# Patient Record
Sex: Female | Born: 1945 | Race: White | Hispanic: No | Smoking: Current every day smoker
Health system: Southern US, Community
[De-identification: ages and names within clinical notes are randomized; demographics above are authoritative.]

## PROBLEM LIST (undated history)

## (undated) DIAGNOSIS — M1990 Unspecified osteoarthritis, unspecified site: Secondary | ICD-10-CM

## (undated) DIAGNOSIS — G4700 Insomnia, unspecified: Secondary | ICD-10-CM

## (undated) DIAGNOSIS — C4491 Basal cell carcinoma of skin, unspecified: Secondary | ICD-10-CM

## (undated) DIAGNOSIS — F329 Major depressive disorder, single episode, unspecified: Secondary | ICD-10-CM

## (undated) DIAGNOSIS — I2510 Atherosclerotic heart disease of native coronary artery without angina pectoris: Secondary | ICD-10-CM

## (undated) DIAGNOSIS — F419 Anxiety disorder, unspecified: Secondary | ICD-10-CM

## (undated) DIAGNOSIS — K759 Inflammatory liver disease, unspecified: Secondary | ICD-10-CM

## (undated) DIAGNOSIS — I10 Essential (primary) hypertension: Secondary | ICD-10-CM

## (undated) DIAGNOSIS — Z9849 Cataract extraction status, unspecified eye: Secondary | ICD-10-CM

## (undated) DIAGNOSIS — R519 Headache, unspecified: Secondary | ICD-10-CM

## (undated) DIAGNOSIS — F32A Depression, unspecified: Secondary | ICD-10-CM

## (undated) DIAGNOSIS — E7800 Pure hypercholesterolemia, unspecified: Secondary | ICD-10-CM

## (undated) DIAGNOSIS — H6690 Otitis media, unspecified, unspecified ear: Secondary | ICD-10-CM

## (undated) DIAGNOSIS — R51 Headache: Secondary | ICD-10-CM

## (undated) HISTORY — PX: KNEE SURGERY: SHX244

## (undated) HISTORY — DX: Pure hypercholesterolemia, unspecified: E78.00

## (undated) HISTORY — DX: Otitis media, unspecified, unspecified ear: H66.90

## (undated) HISTORY — PX: EYE SURGERY: SHX253

## (undated) HISTORY — PX: TONSILLECTOMY: SUR1361

## (undated) HISTORY — DX: Unspecified osteoarthritis, unspecified site: M19.90

## (undated) HISTORY — DX: Essential (primary) hypertension: I10

## (undated) HISTORY — DX: Cataract extraction status, unspecified eye: Z98.49

## (undated) HISTORY — DX: Inflammatory liver disease, unspecified: K75.9

## (undated) HISTORY — DX: Basal cell carcinoma of skin, unspecified: C44.91

## (undated) HISTORY — PX: SALPINGOOPHORECTOMY: SHX82

## (undated) HISTORY — PX: CARDIAC CATHETERIZATION: SHX172

---

## 1970-04-08 DIAGNOSIS — K759 Inflammatory liver disease, unspecified: Secondary | ICD-10-CM

## 1970-04-08 HISTORY — DX: Inflammatory liver disease, unspecified: K75.9

## 2006-03-25 ENCOUNTER — Emergency Department (HOSPITAL_COMMUNITY): Admission: EM | Admit: 2006-03-25 | Discharge: 2006-03-25 | Payer: Self-pay | Admitting: Emergency Medicine

## 2006-04-04 ENCOUNTER — Emergency Department (HOSPITAL_COMMUNITY): Admission: EM | Admit: 2006-04-04 | Discharge: 2006-04-04 | Payer: Self-pay | Admitting: Emergency Medicine

## 2007-11-13 ENCOUNTER — Emergency Department (HOSPITAL_COMMUNITY): Admission: EM | Admit: 2007-11-13 | Discharge: 2007-11-14 | Payer: Self-pay | Admitting: Emergency Medicine

## 2009-08-07 ENCOUNTER — Ambulatory Visit (HOSPITAL_COMMUNITY): Admission: RE | Admit: 2009-08-07 | Discharge: 2009-08-07 | Payer: Self-pay | Admitting: Nurse Practitioner

## 2009-11-07 ENCOUNTER — Ambulatory Visit: Payer: Self-pay | Admitting: Internal Medicine

## 2009-11-07 DIAGNOSIS — K625 Hemorrhage of anus and rectum: Secondary | ICD-10-CM | POA: Insufficient documentation

## 2009-11-07 DIAGNOSIS — R197 Diarrhea, unspecified: Secondary | ICD-10-CM | POA: Insufficient documentation

## 2009-11-07 DIAGNOSIS — K5190 Ulcerative colitis, unspecified, without complications: Secondary | ICD-10-CM | POA: Insufficient documentation

## 2009-11-08 ENCOUNTER — Encounter: Payer: Self-pay | Admitting: Internal Medicine

## 2009-12-15 ENCOUNTER — Telehealth (INDEPENDENT_AMBULATORY_CARE_PROVIDER_SITE_OTHER): Payer: Self-pay

## 2009-12-28 ENCOUNTER — Ambulatory Visit (HOSPITAL_COMMUNITY): Admission: RE | Admit: 2009-12-28 | Discharge: 2009-12-28 | Payer: Self-pay | Admitting: Internal Medicine

## 2009-12-28 ENCOUNTER — Ambulatory Visit: Payer: Self-pay | Admitting: Internal Medicine

## 2009-12-28 HISTORY — PX: COLONOSCOPY: SHX174

## 2010-01-01 ENCOUNTER — Telehealth (INDEPENDENT_AMBULATORY_CARE_PROVIDER_SITE_OTHER): Payer: Self-pay

## 2010-01-03 ENCOUNTER — Encounter: Payer: Self-pay | Admitting: Gastroenterology

## 2010-01-09 ENCOUNTER — Ambulatory Visit (HOSPITAL_COMMUNITY): Admission: RE | Admit: 2010-01-09 | Discharge: 2010-01-09 | Payer: Self-pay | Admitting: Nurse Practitioner

## 2010-01-12 ENCOUNTER — Encounter: Payer: Self-pay | Admitting: Internal Medicine

## 2010-01-30 ENCOUNTER — Ambulatory Visit (HOSPITAL_COMMUNITY): Admission: RE | Admit: 2010-01-30 | Discharge: 2010-01-30 | Payer: Self-pay | Admitting: Orthopaedic Surgery

## 2010-02-28 ENCOUNTER — Encounter (INDEPENDENT_AMBULATORY_CARE_PROVIDER_SITE_OTHER): Payer: Self-pay

## 2010-03-16 ENCOUNTER — Encounter: Payer: Self-pay | Admitting: Gastroenterology

## 2010-03-19 ENCOUNTER — Encounter: Payer: Self-pay | Admitting: Internal Medicine

## 2010-03-19 LAB — CONVERTED CEMR LAB
BUN: 9 mg/dL (ref 6–23)
Potassium: 4.3 meq/L (ref 3.5–5.3)
Sodium: 139 meq/L (ref 135–145)

## 2010-03-21 ENCOUNTER — Ambulatory Visit: Payer: Self-pay | Admitting: Internal Medicine

## 2010-04-17 ENCOUNTER — Encounter (INDEPENDENT_AMBULATORY_CARE_PROVIDER_SITE_OTHER): Payer: Self-pay | Admitting: *Deleted

## 2010-05-08 NOTE — Letter (Signed)
Summary: TCS order  TCS order   Imported By: Craige Cotta 11/07/2009 16:03:22  _____________________________________________________________________  External Attachment:    Type:   Image     Comment:   External Document

## 2010-05-08 NOTE — Letter (Signed)
Summary: REFERRAL FROM Erma Pinto, FNP   REFERRAL FROM Erma Pinto, FNP   Imported By: Hoy Morn 11/08/2009 12:01:52  _____________________________________________________________________  External Attachment:    Type:   Image     Comment:   External Document

## 2010-05-08 NOTE — Letter (Signed)
Summary: Patient Notice, Colon Biopsy Results  [facility]  [address]   [place], [place] [address]   Phone: [phone]  Fax: [phone]       January 12, 2010   Bonnie Powers [address] [place], [place]  [address] [birth year]    Dear [name],  I am pleased to inform you that the biopsies taken during your recent colonoscopy did not show any evidence of cancer upon pathologic examination.  However, they did show active ulcerative colitis.  Additional information/recommendations:  Continue with the treatment plan as outlined on the day of your exam.  Please call us if you are having persistent problems or have questions about your condition that have not been fully answered at this time.  Sincerely,    R. Garfield Cornea MD, [facility] Ph: [phone]    Fax: [phone]   Appended Document: Patient Notice, Colon Biopsy Results letter mailed to pt

## 2010-05-08 NOTE — Miscellaneous (Signed)
Summary: Orders Update  Clinical Lists Changes  Orders: Added new Test order of T-Basic Metabolic Panel ([id number]) - Signed  

## 2010-05-08 NOTE — Letter (Signed)
Summary: Recall, Labs Needed  [facility]  [address]   [place], [place] [address]   Phone: [phone]  Fax: [phone]    February 28, 2010  ALEXANDERA KUNTZMAN [address] [place], [place]  [address] 05/19/1945   Dear Ms. Golob,   Our records indicate it is time to repeat your blood work.  You can take the enclosed form to the lab on or near the date indicated.  Please make note of the new location of the lab:   [place], 2nd floor   [place] office will call you within a week to ten business days with the results.  If you do not hear from [place] in 10 business days, you should call the office.  If you have any questions regarding this, call the office at [phone], and ask for the nurse.  Labs are due on 04/04/2010.   Sincerely,    Burnadette Peter LPN  [facility] Ph: [phone]   Fax: [phone]

## 2010-05-08 NOTE — Progress Notes (Signed)
Summary: phone note/ pt rescheduled TCS due to car trouble  Phone Note Call from Patient   Caller: Patient Summary of Call: Pt called to reschedule TCS that was scheduled for 12/20/2009, due to car trouble. She rescheduled for 12/28/2009 at 12:45 and Maudie Mercury is aware. Moved in [place] also. Initial call taken by: Waldon Merl LPN,  December 15, 4096 2:20 PM

## 2010-05-08 NOTE — Progress Notes (Signed)
Summary: ulcerative colitis/ss   _____________________________________________________________________  External Attachment:    Type:   Image     Comment:   External Document Visit Type:  Initial Consult Referring Provider:  Erma Pinto Primary Care Provider:  Erma Pinto  Chief Complaint:  ulcerative colitis.  History of Present Illness: 65 year old lady referred by [facility] for further evaluation and management of ulcerative colitis. Patient relocated from [place] a few years back. She was diagnosed with ulcerative colitis  in 2006. She describes being on  Asacol and prednisone at different times. Recently given a short course of prednisone in [place];  Has been taking nonsteroidals on occasion for osteoarthritis as recently as a few weeks ago; does have a 7-8 bowel movements daily occasionally -  has some blood per rectum some tenesmus;   no fever.  Vague intermittent lower abdominal cramps. Has occasional reflux symptoms but no odynophagia or dysphagia.   No family history of inflammatory bowel disease or colorectal neoplasia / polyps.   Current Medications (verified): 1)  Vitamin D .... Once Daily 2)  Tylenol Extra Strength 500 Mg Tabs (Acetaminophen) .... As Needed 3)  Daily Multi  Tabs (Multiple Vitamins-Minerals) .... Take 1 Tablet By Mouth Once A Day 4)  Otc Allergy .... Once Daily  Allergies (verified): No Known Drug Allergies  Past History:  Family History: Last updated: 2009/11/24 Father: Deceased age 25  Skin cancer Mother: Deceased age 7  heart failure Siblings: one living brother  healthy three deceased brothers/ motorcycle accident/ complications of HIV/ Lung cancer  Social History: Last updated: 11-24-09 Marital Status: Married Children: 3  living     healthy      one girl deceased/miningitis Occupation: Retired     Engineer, [employer]  Past Medical History: Hypertension Arthritis Hypercholesteremia Hepatis ? in  1972 chronic ear infections ulcerative colitis  Past Surgical History: Fallopian tubes removed Right ovary removed Tonsilectomy Skin Cancer removed from left temple Growths removed from both eyes  Family History: Father: Deceased age 11  Skin cancer Mother: Deceased age 31  heart failure Siblings: one living brother  healthy three deceased brothers/ motorcycle accident/ complications of HIV/ Lung cancer  Social History: Marital Status: Married Children: 3  living     healthy      one girl deceased/miningitis Occupation: Retired     Engineer, petroleum room for [employer]  Vital Signs:  Patient profile:   65 year old female Height:      66 inches Weight:      226 pounds BMI:     36.61 Temp:     97.7 degrees F oral Pulse rate:   80 / minute BP sitting:   140 / 90  (left arm) Cuff size:   regular  Vitals Entered By: Waldon Merl LPN (November 08, 971 3:03 PM)  Physical Exam  General:  pleasant 65 year old lady resting Eyes:  no scleral icterus. Protime are pink Abdomen:  nondistended positive bowel sounds soft and nontender without appreciable mass or organomegaly until time of colonoscopy Impression & Recommendations: Impression: 65 year old lady with a credible, history of ulcerative colitis not on any significant treatment over the past several years. She does have intermittent bleeding diarrhea and abdominal cramps. She certainly does not appear acutely ill or toxic at this time. I suspect recent nonsteroidal therapy has exacerbated her condition. Recommendations: Limit avoid/ nonsteroidals as much as possible.   Diagnostic colonoscopy in the very near future. Risks, benefits, limitations imponderables and alternatives have been reviewed.  Her questions were answered; she is agreeable. She asked for a refill of the Lomotil which she has taken previously. I declined.  I did give her prescription for Bentyl 10 mg a.c. and h.s. #40 with one refill.  Thanks to Erma Pinto  FNP for her kind referral.  Appended Document: Orders Update    Clinical Lists Changes  Problems: Added new problem of ULCERATIVE COLITIS--UNIVERSAL ULCERATIVE COLITIS (ICD-556.9) Added new problem of RECTAL BLEEDING (ICD-569.3) Added new problem of DIARRHEA (ICD-787.91) Orders: Added new Service order of New Patient Level IV (83254) - Signed

## 2010-05-08 NOTE — Progress Notes (Signed)
Summary: insurance wont cover Wynot  Phone Note Call from Patient Call back at [facility] Phone [phone]   Caller: Patient Summary of Call: pt called- RMR put her on Lialda and her insurance wont cover it and it cost 732.00. The only other meds that are in her formulary- but are teir 2 are Balsalazide and Asacol (not asacol HD). There are no teir 1 covered drugs. pt needs something else called in to [facility] Initial call taken by: Burnadette Peter LPN,  January 01, 6001 9:56 AM     Appended Document: insurance wont cover lialda Has she tried anything else that has worked for her?   Appended Document: insurance wont cover lialda pt has only tried asacol and prednisone  Appended Document: insurance wont cover lialda    Prescriptions: ASACOL 400 MG TBEC (MESALAMINE) two by mouth tid  #180 x 11   Entered and Authorized by:   Laureen Ochs. Bernarda Caffey   Signed by:   Laureen Ochs Lewis PA-C on 01/03/2010   Method used:   Electronically to        [facility] (retail)       [place]  [address]       Ph: [phone]       Fax: [phone]   RxID:   [id number]     Appended Document: insurance wont cover lialda she will need met7 in three months  Appended Document: insurance wont cover lialda pt aware, lab order on file

## 2010-05-10 NOTE — Miscellaneous (Signed)
Summary: Orders Update  Clinical Lists Changes  Orders: Added new Test order of T-Basic Metabolic Panel ([id number]) - Signed  

## 2010-05-10 NOTE — Letter (Signed)
Summary: Recall Office Visit  [facility]  [address]   [place], [place] [address]   Phone: [phone]  Fax: [phone]      April 17, 2010   Bonnie Powers [address] [place], [place]  [address] November 11, 1945   Dear Ms. Hoh,   According to our records, it is time for you to schedule a follow-up office visit with [place].   At your convenience, please call [phone] to schedule an office visit. If you have any questions, concerns, or feel that this letter is in error, we would appreciate your call.   Sincerely,    [facility] Ph: [phone]   Fax: [phone]

## 2010-06-20 LAB — URINALYSIS, ROUTINE W REFLEX MICROSCOPIC
Glucose, UA: NEGATIVE mg/dL
Protein, ur: NEGATIVE mg/dL
Specific Gravity, Urine: 1.015 (ref 1.005–1.030)
pH: 5.5 (ref 5.0–8.0)

## 2010-06-20 LAB — SURGICAL PCR SCREEN: Staphylococcus aureus: NEGATIVE

## 2010-06-21 LAB — BASIC METABOLIC PANEL
Calcium: 9.5 mg/dL (ref 8.4–10.5)
GFR calc non Af Amer: >60 mL/min (ref >60)
Glucose, Bld: 94 mg/dL (ref 70–99)
Sodium: 139 mEq/L (ref 135–145)

## 2010-06-21 LAB — CBC
Hemoglobin: 12.8 g/dL (ref 12.0–15.0)
MCHC: 33 g/dL (ref 30.0–36.0)
RDW: 17.4 % — ABNORMAL HIGH (ref 11.5–15.5)

## 2010-08-01 ENCOUNTER — Other Ambulatory Visit (HOSPITAL_COMMUNITY): Payer: Self-pay | Admitting: Family Medicine

## 2010-08-01 DIAGNOSIS — Z139 Encounter for screening, unspecified: Secondary | ICD-10-CM

## 2010-08-13 ENCOUNTER — Ambulatory Visit (HOSPITAL_COMMUNITY)
Admission: RE | Admit: 2010-08-13 | Discharge: 2010-08-13 | Disposition: A | Discharge status: Home / Self Care | Payer: Medicare Other | Source: Ambulatory Visit | Attending: Family Medicine | Admitting: Family Medicine

## 2010-08-13 DIAGNOSIS — Z139 Encounter for screening, unspecified: Secondary | ICD-10-CM

## 2010-08-13 DIAGNOSIS — Z1231 Encounter for screening mammogram for malignant neoplasm of breast: Secondary | ICD-10-CM | POA: Insufficient documentation

## 2010-08-17 ENCOUNTER — Ambulatory Visit (INDEPENDENT_AMBULATORY_CARE_PROVIDER_SITE_OTHER): Payer: Medicare Other | Admitting: Urgent Care

## 2010-08-17 ENCOUNTER — Ambulatory Visit: Payer: Self-pay | Admitting: Internal Medicine

## 2010-08-17 ENCOUNTER — Encounter: Payer: Self-pay | Admitting: Urgent Care

## 2010-08-17 VITALS — BP 160/84 | HR 100 | Temp 97.3°F | Ht 66.0 in | Wt 222.2 lb

## 2010-08-17 DIAGNOSIS — R197 Diarrhea, unspecified: Secondary | ICD-10-CM

## 2010-08-17 DIAGNOSIS — K5190 Ulcerative colitis, unspecified, without complications: Secondary | ICD-10-CM

## 2010-08-17 LAB — CBC WITH DIFFERENTIAL/PLATELET
Lymphocytes Relative: 20 % (ref 12–46)
Lymphs Abs: 2 10*3/uL (ref 0.7–4.0)
MCV: 81.5 fL (ref 78.0–100.0)
Neutrophils Relative %: 71 % (ref 43–77)
Platelets: 362 10*3/uL (ref 150–400)
RBC: 5.04 MIL/uL (ref 3.87–5.11)
WBC: 10.1 10*3/uL (ref 4.0–10.5)

## 2010-08-17 LAB — COMPREHENSIVE METABOLIC PANEL
ALT: 11 U/L (ref 0–35)
CO2: 21 mEq/L (ref 19–32)
Calcium: 9.3 mg/dL (ref 8.4–10.5)
Chloride: 104 mEq/L (ref 96–112)
Sodium: 139 mEq/L (ref 135–145)
Total Protein: 7.2 g/dL (ref 6.0–8.3)

## 2010-08-17 MED ORDER — PREDNISONE 10 MG PO TABS: 10.0000 mg | ORAL_TABLET | ORAL | Status: DC

## 2010-08-17 MED ORDER — DICYCLOMINE HCL 10 MG PO CAPS: 10.0000 mg | ORAL_CAPSULE | Freq: Three times a day (TID) | ORAL | Status: DC

## 2010-08-17 MED ORDER — MESALAMINE 400 MG PO TBEC: 800.0000 mg | DELAYED_RELEASE_TABLET | Freq: Three times a day (TID) | ORAL | Status: DC

## 2010-08-17 MED ORDER — HYDROCODONE-ACETAMINOPHEN 5-500 MG PO TABS: 1.0000 | ORAL_TABLET | Freq: Four times a day (QID) | ORAL | Status: AC | PRN

## 2010-08-17 NOTE — Assessment & Plan Note (Addendum)
Bonnie Powers is a 65 y.o. 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  Previously maintained on Imuran & Asacol, however was medicine-free due to lack of financial resources for 5 years.  Resumed high-dose Asacol last fall after colonoscopy & has had recent flare.  I question whether she has ever had a recent remission given her history.  We may need to revisit Imuran.  We do need to r/o concommitant infectious process & c diff given recent antibiotic use.  She should have high-risk screening colonoscopy every 1-2 years given 10-yr hx of UC.    Use vicodin as needed for pain Continue asacol 835m three times per day Continue multivitamin daily Go get labs today & return stools asap Promethius TPMT enzyme activity Prednisone 447mdaily x 2 weeks 3091maily x 2 weeks 73m75mily x 2 weeks 10mg29mly x 2 weeks, then stop Call if dehydrated, severe pain or no better

## 2010-08-17 NOTE — Progress Notes (Signed)
Primary Care Physician:  Erma Pinto, NP, NP Primary Gastroenterologist:  Dr. [name]  Chief Complaint  Patient presents with  . Follow-up    lot of pain from colitis for the past 2 wks    HPI:  Bonnie Powers is a 65 y.o. female here for follow up for ulcerative colitis flare.  Dx with UC in 2001.  Last colonoscopy showed pan-UC by Dr [name] 12/28/09.  Taking Asacol 859m TID.  C/o 3-wk hx of extreme pain, cramps, bloody diarrhea w/ mucus,  & fatigue.  C/o incontinence & urgency.  Denies any foreign travel.  Was on 7-day course of antibiotics in March for UTI/OM.  Pain bilat lower abd 9/10, was taking bentyl 152m2 pills daily seems to help.  C/o low grade fever 101 & chills.  Denies any NSAIDs.  Appetite decreased secondary to pain/diarrhea.  Wt down 4# in 9 mo.  Some arthritic & knee pain.  Was given celebrex but did not take.  4 courses of steroids in past 5 yrs.  Bone density study 1 yr ago pt believes it was fine except OA.  Was on Imuran 6 yrs ago, but stopped due to lack of financial resources.  Past Medical History  Diagnosis Date  . Ulcerative colitis 2001    Last colonoscopy Dr RoGala Romney/22/11-> pan-UC, normal TI, maintained on Asacol 12/2009  . HTN (hypertension)   . OA (osteoarthritis)   . Hypercholesteremia   . Hepatitis 1972    ? unknown type  . Otitis media, chronic   . Basal cell cancer     left temple    Past Surgical History  Procedure Date  . Salpingoophorectomy     right ovary, both tubes  . Tonsillectomy   . Eye surgery    Current Outpatient Prescriptions  Medication Sig Dispense Refill  . dicyclomine (BENTYL) 10 MG capsule Take 1 capsule (10 mg total) by mouth 4 (four) times daily -  before meals and at bedtime.  90 capsule  5  . loratadine (CLARITIN) 10 MG tablet Take 10 mg by mouth daily.        . mesalamine (ASACOL) 400 MG EC tablet Take 2 tablets (800 mg total) by mouth 3 (three) times daily.  180 tablet  5  . Multiple Vitamin (MULTIVITAMIN) capsule Take  1 capsule by mouth daily.        . Vitamin D, Ergocalciferol, (DRISDOL) 50000 UNITS CAPS Take 50,000 Units by mouth.        . DISCONTD: ASACOL 400 MG EC tablet Take 800 mg by mouth 3 (three) times daily.             . Marland KitchenYDROcodone-acetaminophen (VICODIN) 5-500 MG per tablet Take 1 tablet by mouth every 6 (six) hours as needed for pain.  30 tablet  0  . predniSONE (DELTASONE) 10 MG tablet Take 1 tablet (10 mg total) by mouth as directed. Take 4 tabs daily x 2 weeks Decrease to 3 tabs daily x 2 weeks Then 2 tabs daily x 2 weeks Then 1 tab daily x 2 weeks Then stop   150 tablet  0     Allergies as of 08/17/2010  . (No Known Allergies)    Family History: There is no known family history of colorectal carcinoma , liver disease, or inflammatory bowel disease.  Problem Relation Age of Onset  . Skin cancer Father   . Heart failure Mother   . HIV Brother   . Lung cancer Brother  History   Social History  . Marital Status: Divorced    Spouse Name: N/A    Number of Children: 3  . Years of Education: N/A   Occupational History  . retired     Conservator, museum/gallery   Social History Main Topics  . Smoking status: Current Everyday Smoker -- 1.0 packs/day for 30 years    Types: Cigarettes  . Smokeless tobacco: Not on file   Comment: trying  . Alcohol Use: No  . Drug Use: No  . Sexually Active: No   Social History Narrative   Lost 1 daughter w/ meningitis    Review of Systems: Gen: See HPI CV: Denies chest pain, angina, palpitations, syncope, orthopnea, PND, peripheral edema, and claudication. Resp: Denies dyspnea at rest, dyspnea with exercise, cough, sputum, wheezing, coughing up blood, and pleurisy. GI: Denies vomiting blood, jaundice.  Denies heartburn/indigestion.  Denies dysphagia or odynophagia. Derm: Denies rash, itching, dry skin, hives, moles, warts, or unhealing ulcers.  Psych: Denies depression, anxiety, memory loss, suicidal ideation, hallucinations, paranoia, and  confusion. Heme: Denies bruising, bleeding, and enlarged lymph nodes.  Physical Exam: BP 160/84  Pulse 100  Temp(Src) 97.3 F (36.3 C) (Tympanic)  Ht 5' 6"  (1.676 m)  Wt 222 lb 3.2 oz (100.789 kg)  BMI 35.86 kg/m2 General:   Alert,  Well-developed, obese, pleasant and cooperative in NAD Head:  Normocephalic and atraumatic. Eyes:  Sclera clear, no icterus.   Conjunctiva pink. Mouth:  No deformity or lesions, dentition normal. Neck:  Supple; no masses or thyromegaly. Heart:  Regular rate and rhythm; no murmurs, clicks, rubs,  or gallops. Abdomen:  Soft, mild distention, mild tenderness suprapubic area & around umbilicus. No masses, hepatosplenomegaly or hernias noted. Normal bowel sounds, without guarding, and without rebound.   Msk:  Symmetrical without gross deformities. Normal posture. Pulses:  Normal pulses noted. Extremities:  Without clubbing or edema. Neurologic:  Alert and  oriented x4;  grossly normal neurologically. Skin:  Intact without significant lesions or rashes. Cervical Nodes:  No significant cervical adenopathy. Psych:  Alert and cooperative. Normal mood and affect.

## 2010-08-17 NOTE — Patient Instructions (Signed)
Use vicodin as needed for pain Continue asacol 8107m three times per day Continue multivitamin daily Use prednisone as directed Call if dehydrated, severe pain or no better

## 2010-08-20 NOTE — Progress Notes (Signed)
Cc to PCP 

## 2010-08-20 NOTE — Progress Notes (Signed)
Colonoscopy q 2 years per Dr. [name].  Next colonoscopy 12/2011

## 2010-08-21 ENCOUNTER — Other Ambulatory Visit: Payer: Self-pay | Admitting: Urgent Care

## 2010-08-24 ENCOUNTER — Telehealth: Payer: Self-pay | Admitting: Urgent Care

## 2010-08-24 MED ORDER — ALPRAZOLAM 0.25 MG PO TABS: 0.2500 mg | ORAL_TABLET | Freq: Three times a day (TID) | ORAL | Status: AC | PRN

## 2010-08-24 NOTE — Telephone Encounter (Signed)
Spoke w/ pt Denies depression, denies suicidal/homicidal Pt notes having some anxiety with high dose prednisone. Requesting xanax.  Took before low dose seems to help. Instructed to drop dose of prednisone to 25m daily then continue taper [facility] on lower dose of prednisone or any other problems

## 2010-08-24 NOTE — Telephone Encounter (Signed)
Message copied by Vickey Huger on Fri Aug 24, 2010 10:37 AM ------      Message from: Burnadette Peter      Created: Fri Aug 24, 2010 10:34 AM       Pt aware, diarrhea is ending, she feels ok, but the prednisone makes her nervous and shaky and emotional. Wants to know if she can have rx for low dose xanax to help with this during this time. Please advise

## 2010-08-25 LAB — STOOL CULTURE

## 2010-08-28 NOTE — Progress Notes (Signed)
Please see where [facility] enzyme activity levels are?

## 2010-08-29 NOTE — Progress Notes (Signed)
Called [facility] and they are still working on labs. Bonnie Powers said it should only be a couple more days.

## 2010-08-30 NOTE — Progress Notes (Addendum)
Please call pt.  Her lab work shows that she should be able to tolerate Imuran. TPMT enzyme activity 31.8. Begin Imuran 3m daily. CBC weekly x 4 wks once she starts med Then CBC q2wks x 4 Then CBC in one month CMP in 1 month from start Keep OV as planned

## 2010-09-04 NOTE — Progress Notes (Signed)
Pt aware, will get lab orders when she comes in for her next appt on 09/12/10. Rx called to [facility]

## 2010-09-05 ENCOUNTER — Encounter: Payer: Self-pay | Admitting: Urgent Care

## 2010-09-05 ENCOUNTER — Other Ambulatory Visit: Payer: Self-pay | Admitting: Urgent Care

## 2010-09-05 DIAGNOSIS — Z79899 Other long term (current) drug therapy: Secondary | ICD-10-CM

## 2010-09-05 NOTE — Telephone Encounter (Signed)
Telephone note opened in error

## 2010-09-12 ENCOUNTER — Ambulatory Visit (INDEPENDENT_AMBULATORY_CARE_PROVIDER_SITE_OTHER): Payer: Medicare Other | Admitting: Urgent Care

## 2010-09-12 ENCOUNTER — Other Ambulatory Visit: Payer: Self-pay | Admitting: Urgent Care

## 2010-09-12 ENCOUNTER — Encounter: Payer: Self-pay | Admitting: Urgent Care

## 2010-09-12 VITALS — BP 156/93 | HR 85 | Temp 97.8°F | Ht 66.0 in | Wt 231.8 lb

## 2010-09-12 DIAGNOSIS — K5190 Ulcerative colitis, unspecified, without complications: Secondary | ICD-10-CM

## 2010-09-12 NOTE — Patient Instructions (Addendum)
Go get lab work as planned Continue imuran as directed Continue taper on prednisone Continue asacol as directed Call if any problems Colonoscopy 12/2011

## 2010-09-12 NOTE — Progress Notes (Signed)
Referring Provider: Erma Pinto, NP Primary Care Physician:  Erma Pinto, NP, NP Primary Gastroenterologist:  Dr. [name]  Chief Complaint  Patient presents with  . Follow-up    UC    HPI:  Bonnie Powers is a 64 y.o. female here for follow up for UC flare.  Was seen 1 month ago with abd pain & bloody diarrhea.  Started on prednisone taper along with continued asacol 2.4 grams daily.  [facility] TPMT enzyme activity normal so she was started back on imuran after a several year hiatus.  Prednisone taper caused some anxiety.  Now on Prednisone 1m daily.  Anxiety much better w/ decreased dose.  Started Imuran 1 week ago doing well.  BM 1-3 per day without bleeding.  Appetite great on prednisone.  Denies fever or chills.  KErma Pintodid last bone density->pt believes some osteopenia or osteoporosis.  Denies any abd pain, really has not had to use vicodin for abd pain.    Past Medical History  Diagnosis Date  . Ulcerative colitis 2001    Last colonoscopy Dr RGala Romney9/22/11-> pan-UC, normal TI, maintained on Asacol 12/2009  . HTN (hypertension)   . OA (osteoarthritis)   . Hypercholesteremia   . Hepatitis 1972    ? unknown type  . Otitis media, chronic   . Basal cell cancer     left temple    Past Surgical History  Procedure Date  . Salpingoophorectomy     right ovary, both tubes  . Tonsillectomy   . Eye surgery     Current Outpatient Prescriptions  Medication Sig Dispense Refill  . ALPRAZolam (XANAX) 0.25 MG tablet Take 1 tablet (0.25 mg total) by mouth 3 (three) times daily as needed (anxiety).  30 tablet  0  . azaTHIOprine (IMURAN) 50 MG tablet Take 50 mg by mouth daily.       .Marland Kitchendicyclomine (BENTYL) 10 MG capsule Take 1 capsule (10 mg total) by mouth 4 (four) times daily -  before meals and at bedtime.  90 capsule  5  . HYDROcodone-acetaminophen (VICODIN) 5-500 MG per tablet       . loratadine (CLARITIN) 10 MG tablet Take 10 mg by mouth daily.        . mesalamine  (ASACOL) 400 MG EC tablet Take 2 tablets (800 mg total) by mouth 3 (three) times daily.  180 tablet  5  . Multiple Vitamin (MULTIVITAMIN) capsule Take 1 capsule by mouth daily.        . predniSONE (DELTASONE) 10 MG tablet Take 1 tablet (10 mg total) by mouth as directed. Take 4 tabs daily x 2 weeks Decrease to 3 tabs daily x 2 weeks Then 2 tabs daily x 2 weeks Then 1 tab daily x 2 weeks Then stop   150 tablet  0  . Vitamin D, Ergocalciferol, (DRISDOL) 50000 UNITS CAPS Take 50,000 Units by mouth.          Allergies as of 09/12/2010  . (No Known Allergies)    Family History:There is no known family history of colorectal carcinoma , liver disease, or inflammatory bowel disease.  Problem Relation Age of Onset  . Skin cancer Father   . Heart failure Mother   . HIV Brother   . Lung cancer Brother     History   Social History  . Marital Status: Divorced    Spouse Name: N/A    Number of Children: 3  . Years of Education: N/A   Occupational History  . retired  mgr gift shop   Social History Main Topics  . Smoking status: Current Everyday Smoker -- 1.0 packs/day for 30 years    Types: Cigarettes  . Smokeless tobacco: Not on file   Comment: trying  . Alcohol Use: No  . Drug Use: No  . Sexually Active: No   Other Topics Concern  . Not on file   Social History Narrative   Lost 1 daughter w/ meningitis   Review of Systems: Gen: Denies any fever, chills, sweats, anorexia, fatigue, weakness, malaise, weight loss, and sleep disorder CV: Denies chest pain, angina, palpitations, syncope, orthopnea, PND, peripheral edema, and claudication. Resp: Denies dyspnea at rest, dyspnea with exercise, cough, sputum, wheezing, coughing up blood, and pleurisy. GI: Denies vomiting blood, jaundice, and fecal incontinence.   Denies dysphagia or odynophagia. Derm: Denies rash, itching, dry skin, hives, moles, warts, or unhealing ulcers.  Psych: Denies depression, anxiety, memory loss, suicidal  ideation, hallucinations, paranoia, and confusion. Heme: Denies bruising, bleeding, and enlarged lymph nodes.  Physical Exam: BP 156/93  Pulse 85  Temp(Src) 97.8 F (36.6 C) (Temporal)  Ht 5' 6"  (1.676 m)  Wt 231 lb 12.8 oz (105.144 kg)  BMI 37.41 kg/m2 General:   Alert,  Well-developed, well-nourished, pleasant and cooperative in NAD Head:  Normocephalic and atraumatic. Eyes:  Sclera clear, no icterus.   Conjunctiva pink. Mouth:  No deformity or lesions, dentition normal. Neck:  Supple; no masses or thyromegaly. Heart:  Regular rate and rhythm; no murmurs, clicks, rubs,  or gallops. Abdomen:  Soft, nontender and nondistended. No masses, hepatosplenomegaly or hernias noted. Normal bowel sounds, without guarding, and without rebound.   Msk:  Symmetrical without gross deformities. Normal posture. Pulses:  Normal pulses noted. Extremities:  Without clubbing or edema. Neurologic:  Alert and  oriented x4;  grossly normal neurologically. Skin:  Intact without significant lesions or rashes. Cervical Nodes:  No significant cervical adenopathy. Psych:  Alert and cooperative. Normal mood and affect.

## 2010-09-12 NOTE — Assessment & Plan Note (Addendum)
Doing very well on prednisone taper, asacol & started imuran last week.  Will continue taper prednisone.  Continue all meds.  Get CBC & CMP checked at already scheduled intervals.  Encourage to follow-up with Bonnie Powers regarding bone density studies & calcium recommendations since she had DEXA last yr.  Call if any problems.  Go get lab work as planned Continue imuran as directed Continue taper on prednisone Continue asacol as directed Call if any problems Colonoscopy 12/2011

## 2010-09-12 NOTE — Progress Notes (Signed)
Cc to PCP 

## 2010-09-13 LAB — CBC WITH DIFFERENTIAL/PLATELET
Basophils Absolute: 0 10*3/uL (ref 0.0–0.1)
Basophils Relative: 0 % (ref 0–1)
HCT: 42.1 % (ref 36.0–46.0)
Hemoglobin: 13.2 g/dL (ref 12.0–15.0)
Lymphocytes Relative: 43 % (ref 12–46)
Monocytes Absolute: 0.7 10*3/uL (ref 0.1–1.0)
Neutro Abs: 5.2 10*3/uL (ref 1.7–7.7)
Neutrophils Relative %: 49 % (ref 43–77)
RDW: 19.9 % — ABNORMAL HIGH (ref 11.5–15.5)
WBC: 10.6 10*3/uL — ABNORMAL HIGH (ref 4.0–10.5)

## 2010-10-04 ENCOUNTER — Other Ambulatory Visit: Payer: Self-pay | Admitting: Urgent Care

## 2010-10-05 LAB — COMPREHENSIVE METABOLIC PANEL
ALT: 16 U/L (ref 0–35)
AST: 16 U/L (ref 0–37)
Albumin: 4.2 g/dL (ref 3.5–5.2)
Alkaline Phosphatase: 52 U/L (ref 39–117)
BUN: 17 mg/dL (ref 6–23)
Calcium: 10 mg/dL (ref 8.4–10.5)
Chloride: 103 mEq/L (ref 96–112)
Potassium: 4.1 mEq/L (ref 3.5–5.3)
Sodium: 139 mEq/L (ref 135–145)
Total Protein: 6.9 g/dL (ref 6.0–8.3)

## 2010-10-22 ENCOUNTER — Other Ambulatory Visit: Payer: Self-pay | Admitting: Urgent Care

## 2010-10-23 LAB — CBC WITH DIFFERENTIAL/PLATELET
Basophils Absolute: 0 10*3/uL (ref 0.0–0.1)
Basophils Relative: 0 % (ref 0–1)
Eosinophils Absolute: 0.1 10*3/uL (ref 0.0–0.7)
MCH: 27.5 pg (ref 26.0–34.0)
MCHC: 32.6 g/dL (ref 30.0–36.0)
Monocytes Relative: 8 % (ref 3–12)
Neutro Abs: 4.2 10*3/uL (ref 1.7–7.7)
Neutrophils Relative %: 58 % (ref 43–77)
Platelets: 328 10*3/uL (ref 150–400)
RDW: 18.6 % — ABNORMAL HIGH (ref 11.5–15.5)

## 2010-10-29 ENCOUNTER — Encounter: Payer: Self-pay | Admitting: Internal Medicine

## 2010-11-15 ENCOUNTER — Encounter: Payer: Self-pay | Admitting: Internal Medicine

## 2010-12-05 ENCOUNTER — Other Ambulatory Visit: Payer: Self-pay | Admitting: Urgent Care

## 2010-12-06 LAB — CBC WITH DIFFERENTIAL/PLATELET
Basophils Absolute: 0 10*3/uL (ref 0.0–0.1)
Eosinophils Absolute: 0.1 10*3/uL (ref 0.0–0.7)
Eosinophils Relative: 2 % (ref 0–5)
Lymphocytes Relative: 37 % (ref 12–46)
Lymphs Abs: 3 10*3/uL (ref 0.7–4.0)
MCV: 86.1 fL (ref 78.0–100.0)
Neutrophils Relative %: 53 % (ref 43–77)
Platelets: 304 10*3/uL (ref 150–400)
RBC: 5.17 MIL/uL — ABNORMAL HIGH (ref 3.87–5.11)
RDW: 16.4 % — ABNORMAL HIGH (ref 11.5–15.5)
WBC: 8.2 10*3/uL (ref 4.0–10.5)

## 2010-12-14 ENCOUNTER — Ambulatory Visit: Payer: Medicare Other | Admitting: Internal Medicine

## 2010-12-17 ENCOUNTER — Ambulatory Visit: Payer: Medicare Other | Admitting: Internal Medicine

## 2010-12-21 ENCOUNTER — Ambulatory Visit: Payer: Medicare Other | Admitting: Internal Medicine

## 2010-12-28 ENCOUNTER — Ambulatory Visit (INDEPENDENT_AMBULATORY_CARE_PROVIDER_SITE_OTHER): Payer: Medicare Other | Admitting: Internal Medicine

## 2010-12-28 ENCOUNTER — Other Ambulatory Visit: Payer: Self-pay

## 2010-12-28 ENCOUNTER — Encounter: Payer: Self-pay | Admitting: Internal Medicine

## 2010-12-28 ENCOUNTER — Telehealth: Payer: Self-pay

## 2010-12-28 VITALS — BP 150/95 | HR 96 | Temp 97.3°F | Ht 66.0 in | Wt 235.4 lb

## 2010-12-28 DIAGNOSIS — K5190 Ulcerative colitis, unspecified, without complications: Secondary | ICD-10-CM

## 2010-12-28 NOTE — Telephone Encounter (Signed)
Pt's AVS and lab order mailed.

## 2010-12-28 NOTE — Assessment & Plan Note (Addendum)
Clinically, in remission on low-dose Imuran and Asacol. I feel we have room to increase the Imuran at this time based on the TPMT assay. I reviewed the potential long-term risk of Imuran for this nice lady today.  Recommendations: Increase Imuran to 100 mg daily.  Continue Asacol at  current dosing for the time being.  Would utilize non--nonsteroidal drugs for control of arthritis pain  CBC and LFTs in 2 weeks.  Return office visit in 3 months. Continue probiotic therapy.     Pt instructions:  Increase Imuran to 100 mg daily  Cont Asacol  Cont Phillips probiotic   Cbc and lfts in 2 weeks  Ov in 3 months

## 2010-12-28 NOTE — Progress Notes (Signed)
Primary Care Physician:  Erma Pinto, NP, NP Primary Gastroenterologist:  Dr.   Pre-Procedure History & Physical: HPI:  Bonnie Powers is a 65 y.o. female here for pan ulcerative colitis. Started on low-dose Imuran this past spring. Continues on ACE call. Prednisone taper now completed. Patient ran out of Imuran 5 days ago. She is nearly back to her baseline. No bowel pain tenesmus rectal bleeding. She denies diarrhea. She is very pleased. Would like to come off Asacol one day because of cost; colonoscopy lastly done 12/28/2009  Her orthopedic issues have been a problem. She has been given nonsteroidals which he is declined today because of her IBD.  Past Medical History  Diagnosis Date  . Ulcerative colitis 2001    Last colonoscopy Dr [name] 12/28/09-> pan-UC, normal TI, maintained on Asacol 12/2009  . HTN (hypertension)   . OA (osteoarthritis)   . Hypercholesteremia   . Hepatitis 1972    ? unknown type  . Otitis media, chronic   . Basal cell cancer     left temple    Past Surgical History  Procedure Date  . Salpingoophorectomy     right ovary, both tubes  . Tonsillectomy   . Eye surgery     Prior to Admission medications   Medication Sig Start Date End Date Taking? Authorizing Provider  azaTHIOprine (IMURAN) 50 MG tablet Take 50 mg by mouth daily.  09/04/10  Yes Historical Provider, MD  dicyclomine (BENTYL) 10 MG capsule Take 1 capsule (10 mg total) by mouth 4 (four) times daily -  before meals and at bedtime. 08/17/10  Yes Vickey Huger, NP  HYDROcodone-acetaminophen (VICODIN) 5-500 MG per tablet  08/17/10  Yes Historical Provider, MD  loratadine (CLARITIN) 10 MG tablet Take 10 mg by mouth daily.     Yes Historical Provider, MD  mesalamine (ASACOL) 400 MG EC tablet Take 2 tablets (800 mg total) by mouth 3 (three) times daily. 08/17/10  Yes Vickey Huger, NP  Multiple Vitamin (MULTIVITAMIN) capsule Take 1 capsule by mouth daily.     Yes Historical Provider, MD  predniSONE  (DELTASONE) 10 MG tablet Take 1 tablet (10 mg total) by mouth as directed. Take 4 tabs daily x 2 weeks Decrease to 3 tabs daily x 2 weeks Then 2 tabs daily x 2 weeks Then 1 tab daily x 2 weeks Then stop  08/17/10  Yes Vickey Huger, NP  Vitamin D, Ergocalciferol, (DRISDOL) 50000 UNITS CAPS Take 50,000 Units by mouth.     Yes Historical Provider, MD    Allergies as of 12/28/2010  . (No Known Allergies)    Family History  Problem Relation Age of Onset  . Skin cancer Father   . Heart failure Mother   . HIV Brother   . Lung cancer Brother     History   Social History  . Marital Status: Divorced    Spouse Name: N/A    Number of Children: 3  . Years of Education: N/A   Occupational History  . retired     Conservator, museum/gallery   Social History Main Topics  . Smoking status: Current Everyday Smoker -- 1.0 packs/day for 30 years    Types: Cigarettes  . Smokeless tobacco: Not on file   Comment: trying  . Alcohol Use: No  . Drug Use: No  . Sexually Active: No   Other Topics Concern  . Not on file   Social History Narrative   Lost 1 daughter w/ meningitis    Review of  Systems: See HPI, otherwise negative ROS  Physical Exam: BP 150/95  Pulse 96  Temp(Src) 97.3 F (36.3 C) (Temporal)  Ht 5' 6"  (1.676 m)  Wt 235 lb 6.4 oz (106.777 kg)  BMI 37.99 kg/m2 General:   Alert,  Well-developed, well-nourished, pleasant and cooperative in NAD Head:  Normocephalic and atraumatic. Eyes:  Sclera clear, no icterus.   Conjunctiva pink. Ears:  Normal auditory acuity. Nose:  No deformity, discharge,  or lesions. Mouth:  No deformity or lesions, dentition normal. Neck:  Supple; no masses or thyromegaly. Lungs:  Clear throughout to auscultation.   No wheezes, crackles, or rhonchi. No acute distress. Heart:  Regular rate and rhythm; no murmurs, clicks, rubs,  or gallops. Abdomen:  Soft, nontender and nondistended. No masses, hepatosplenomegaly or hernias noted. Normal bowel sounds, without  guarding, and without rebound.   Msk:  Symmetrical without gross deformities. Normal posture. Pulses:  Normal pulses noted. Extremities:  Without clubbing or edema. Neurologic:  Alert and  oriented x4;  grossly normal neurologically. Skin:  Intact without significant lesions or rashes. Cervical Nodes:  No significant cervical adenopathy. Psych:  Alert and cooperative. Normal mood and affect.

## 2010-12-28 NOTE — Patient Instructions (Signed)
Increase Imuran to 100 mg daily  Cont Asacol  Cont Phillips probiotic   Cbc and lfts in 2 weeks  Ov in 3 months

## 2010-12-31 ENCOUNTER — Telehealth: Payer: Self-pay

## 2010-12-31 NOTE — Telephone Encounter (Signed)
Pt called- she needed new rx for imuran called to [facility]. Spoke with AS ok to call in Imuran 54m 2 daily #60 3 rf per RMR office visit note from 12/28/10

## 2010-12-31 NOTE — Telephone Encounter (Signed)
Noted  

## 2011-01-04 LAB — DIFFERENTIAL
Basophils Relative: 0
Eosinophils Absolute: 0.3
Lymphs Abs: 3.2
Monocytes Absolute: 1.1 — ABNORMAL HIGH
Monocytes Relative: 9
Neutro Abs: 8 — ABNORMAL HIGH
Neutrophils Relative %: 63

## 2011-01-04 LAB — CBC
Hemoglobin: 10.9 — ABNORMAL LOW
Platelets: 416 — ABNORMAL HIGH
RDW: 15.8 — ABNORMAL HIGH
WBC: 12.7 — ABNORMAL HIGH

## 2011-01-04 LAB — COMPREHENSIVE METABOLIC PANEL
ALT: 13
Albumin: 3 — ABNORMAL LOW
Alkaline Phosphatase: 60
Chloride: 108
Potassium: 3.6
Sodium: 137
Total Bilirubin: 0.4
Total Protein: 6.6

## 2011-01-04 LAB — URINALYSIS, ROUTINE W REFLEX MICROSCOPIC
Bilirubin Urine: NEGATIVE
Glucose, UA: NEGATIVE
Hgb urine dipstick: NEGATIVE
Specific Gravity, Urine: 1.02

## 2011-02-06 ENCOUNTER — Emergency Department (HOSPITAL_COMMUNITY)
Admission: EM | Admit: 2011-02-06 | Discharge: 2011-02-06 | Disposition: A | Discharge status: Home / Self Care | Payer: Medicare Other | Attending: Emergency Medicine | Admitting: Emergency Medicine

## 2011-02-06 ENCOUNTER — Encounter (HOSPITAL_COMMUNITY): Payer: Self-pay | Admitting: *Deleted

## 2011-02-06 ENCOUNTER — Emergency Department (HOSPITAL_COMMUNITY): Payer: Medicare Other

## 2011-02-06 DIAGNOSIS — R059 Cough, unspecified: Secondary | ICD-10-CM | POA: Insufficient documentation

## 2011-02-06 DIAGNOSIS — K5190 Ulcerative colitis, unspecified, without complications: Secondary | ICD-10-CM | POA: Insufficient documentation

## 2011-02-06 DIAGNOSIS — R071 Chest pain on breathing: Secondary | ICD-10-CM | POA: Insufficient documentation

## 2011-02-06 DIAGNOSIS — R05 Cough: Secondary | ICD-10-CM | POA: Insufficient documentation

## 2011-02-06 DIAGNOSIS — K759 Inflammatory liver disease, unspecified: Secondary | ICD-10-CM | POA: Insufficient documentation

## 2011-02-06 DIAGNOSIS — Z85828 Personal history of other malignant neoplasm of skin: Secondary | ICD-10-CM | POA: Insufficient documentation

## 2011-02-06 DIAGNOSIS — J40 Bronchitis, not specified as acute or chronic: Secondary | ICD-10-CM | POA: Insufficient documentation

## 2011-02-06 DIAGNOSIS — R0781 Pleurodynia: Secondary | ICD-10-CM

## 2011-02-06 DIAGNOSIS — F17200 Nicotine dependence, unspecified, uncomplicated: Secondary | ICD-10-CM | POA: Insufficient documentation

## 2011-02-06 DIAGNOSIS — I10 Essential (primary) hypertension: Secondary | ICD-10-CM | POA: Insufficient documentation

## 2011-02-06 LAB — COMPREHENSIVE METABOLIC PANEL
ALT: 12 U/L (ref 0–35)
AST: 16 U/L (ref 0–37)
Albumin: 3.9 g/dL (ref 3.5–5.2)
Alkaline Phosphatase: 59 U/L (ref 39–117)
Chloride: 103 mEq/L (ref 96–112)
Creatinine, Ser: 0.84 mg/dL (ref 0.50–1.10)
Potassium: 3.6 mEq/L (ref 3.5–5.1)
Sodium: 137 mEq/L (ref 135–145)
Total Bilirubin: 0.3 mg/dL (ref 0.3–1.2)

## 2011-02-06 LAB — CBC
HCT: 41.8 % (ref 36.0–46.0)
Hemoglobin: 13.6 g/dL (ref 12.0–15.0)
RBC: 4.88 MIL/uL (ref 3.87–5.11)

## 2011-02-06 LAB — DIFFERENTIAL
Lymphocytes Relative: 39 % (ref 12–46)
Lymphs Abs: 2.6 10*3/uL (ref 0.7–4.0)
Monocytes Absolute: 0.5 10*3/uL (ref 0.1–1.0)
Monocytes Relative: 7 % (ref 3–12)
Neutro Abs: 3.6 10*3/uL (ref 1.7–7.7)
Neutrophils Relative %: 52 % (ref 43–77)

## 2011-02-06 LAB — D-DIMER, QUANTITATIVE: D-Dimer, Quant: 0.35 ug/mL-FEU (ref 0.00–0.48)

## 2011-02-06 LAB — POCT I-STAT TROPONIN I: Troponin i, poc: 0 ng/mL (ref 0.00–0.08)

## 2011-02-06 LAB — CK TOTAL AND CKMB (NOT AT ARMC)
CK, MB: 2 ng/mL (ref 0.3–4.0)
Relative Index: INVALID (ref 0.0–2.5)

## 2011-02-06 MED ORDER — LEVOFLOXACIN 500 MG PO TABS: 750.0000 mg | ORAL_TABLET | Freq: Every day | ORAL | Status: AC

## 2011-02-06 MED ORDER — HYDROCOD POLST-CHLORPHEN POLST 10-8 MG/5ML PO LQCR: 5.0000 mL | Freq: Two times a day (BID) | ORAL | Status: DC | PRN

## 2011-02-06 MED ORDER — LEVOFLOXACIN 250 MG PO TABS
750.0000 mg | ORAL_TABLET | Freq: Once | ORAL | Status: AC
  Administered 2011-02-06: 750 mg via ORAL
  Filled 2011-02-06: qty 1

## 2011-02-06 NOTE — ED Notes (Addendum)
Into room to assess patient. States she has had a cough x 1.5 weeks. Has been productive with sputum ranging from brown to clear. Pain is around right breast area. Having sharp constant pain. States nothing makes it better. Coughing, deep breathing, and movement makes it worse. Heart sounds regular and strong; S1 and S2 present. Normal sinus on monitor. No swelling in legs. PA at bedside to evaluate. Call bell within reach.

## 2011-02-06 NOTE — ED Notes (Signed)
Pt reports onset of chest discomfort and sob starting earlier tonight pt reports she has had a productive cough for the past week

## 2011-02-06 NOTE — ED Notes (Signed)
Patient back to room from radiology.

## 2011-02-06 NOTE — ED Notes (Signed)
Patient to radiology via stretcher

## 2011-02-06 NOTE — ED Provider Notes (Signed)
History     CSN: [id number] Arrival date & time: 02/06/2011  6:48 PM   First MD Initiated Contact with Patient 02/06/11 1857      Chief Complaint  Patient presents with  . Chest Pain    (Consider location/radiation/quality/duration/timing/severity/associated sxs/prior treatment) HPI Patient noted chest pain at 0200 while still awake.  Pain is sharp and increases with coughing, deep breath, and movement.  Pain has been constantly present since it began.  She has had cold symptoms for a week and has right ear pain.  Fever yesterday to 101.  Cough with some brownish sputum produced.  Patient states she has some intermittent dyspnea mostly due to pain with deep breath.  Patient continues to smoke cigarettes.  She has not had similar symptoms in past .  No history of blood clots in legs or lungs.  Past Medical History  Diagnosis Date  . Ulcerative colitis 2001    Last colonoscopy Dr [name] 12/28/09-> pan-UC, normal TI, maintained on Asacol 12/2009  . HTN (hypertension)   . OA (osteoarthritis)   . Hypercholesteremia   . Hepatitis 1972    ? unknown type  . Otitis media, chronic   . Basal cell cancer     left temple  reviewed.   Past Surgical History  Procedure Date  . Salpingoophorectomy     right ovary, both tubes  . Tonsillectomy   . Eye surgery     Family History  Problem Relation Age of Onset  . Skin cancer Father   . Heart failure Mother   . HIV Brother   . Lung cancer Brother     History  Substance Use Topics  . Smoking status: Current Everyday Smoker -- 1.0 packs/day for 30 years    Types: Cigarettes  . Smokeless tobacco: Not on file   Comment: trying  . Alcohol Use: No    OB History    Grav Para Term Preterm Abortions TAB SAB Ect Mult Living                  Review of Systems  All other systems reviewed and are negative.    Allergies  Aspirin  Home Medications   Current Outpatient Rx  Name Route Sig Dispense Refill  . AZATHIOPRINE 50 MG PO TABS  Oral Take 50 mg by mouth daily.     Marland Kitchen DICYCLOMINE HCL 10 MG PO CAPS Oral Take 1 capsule (10 mg total) by mouth 4 (four) times daily -  before meals and at bedtime. 90 capsule 5  . HYDROCODONE-ACETAMINOPHEN 5-500 MG PO TABS      . LORATADINE 10 MG PO TABS Oral Take 10 mg by mouth daily.      Marland Kitchen MESALAMINE 400 MG PO TBEC Oral Take 2 tablets (800 mg total) by mouth 3 (three) times daily. 180 tablet 5  . MULTIVITAMINS PO CAPS Oral Take 1 capsule by mouth daily.      Marland Kitchen PREDNISONE 10 MG PO TABS Oral Take 1 tablet (10 mg total) by mouth as directed. Take 4 tabs daily x 2 weeks Decrease to 3 tabs daily x 2 weeks Then 2 tabs daily x 2 weeks Then 1 tab daily x 2 weeks Then stop  150 tablet 0  . VITAMIN D (ERGOCALCIFEROL) 50000 UNITS PO CAPS Oral Take 50,000 Units by mouth.       Not taking prednisone There were no vitals taken for this visit.  Physical Exam  Nursing note reviewed. Constitutional: She is oriented to person,  place, and time. She appears well-developed and well-nourished.  HENT:  Head: Normocephalic and atraumatic.  Eyes: Conjunctivae are normal. Pupils are equal, round, and reactive to light.  Neck: Normal range of motion. Neck supple.  Cardiovascular: Normal rate.   Pulmonary/Chest: Effort normal and breath sounds normal.  Abdominal: Soft. Bowel sounds are normal.  Musculoskeletal: Normal range of motion. She exhibits no edema and no tenderness.  Neurological: She is oriented to person, place, and time.  Skin: Skin is warm and dry.  Psychiatric: She has a normal mood and affect.    ED Course  Procedures (including critical care time)   Labs Reviewed  I-STAT TROPONIN I   No results found.   No diagnosis found.    MDM   Date: 02/06/2011  Rate: 84  Rhythm: normal sinus rhythm  QRS Axis: normal  Intervals: normal  ST/T Wave abnormalities: normal  Conduction Disutrbances:none  Narrative Interpretation:   Old EKG Reviewed: none available Dg Chest 2  View  02/06/2011  *RADIOLOGY REPORT*  Clinical Data: Chest pain and shortness of breath.  CHEST - 2 VIEW  Comparison: None.  Findings: The heart is borderline enlarged.  There is tortuosity and calcification of the thoracic aorta.  The lungs are clear except for minimal streaky basilar atelectasis or scarring.  No infiltrates, edema or effusions.  The bony thorax is intact.  IMPRESSION: 1.  Borderline cardiac enlargement. 2.  Streaky basilar atelectasis or scarring.  Original Report Authenticated By: P. Kalman Jewels, M.D.    Results for orders placed during the hospital encounter of 02/06/11  POCT I-STAT TROPONIN I      Component Value Range   Troponin i, poc 0.00  0.00 - 0.08 (ng/mL)   Comment 3           CBC      Component Value Range   WBC 6.8  4.0 - 10.5 (K/uL)   RBC 4.88  3.87 - 5.11 (MIL/uL)   Hemoglobin 13.6  12.0 - 15.0 (g/dL)   HCT 41.8  36.0 - 46.0 (%)   MCV 85.7  78.0 - 100.0 (fL)   MCH 27.9  26.0 - 34.0 (pg)   MCHC 32.5  30.0 - 36.0 (g/dL)   RDW 16.4 (*) 11.5 - 15.5 (%)   Platelets 325  150 - 400 (K/uL)  DIFFERENTIAL      Component Value Range   Neutrophils Relative 52  43 - 77 (%)   Neutro Abs 3.6  1.7 - 7.7 (K/uL)   Lymphocytes Relative 39  12 - 46 (%)   Lymphs Abs 2.6  0.7 - 4.0 (K/uL)   Monocytes Relative 7  3 - 12 (%)   Monocytes Absolute 0.5  0.1 - 1.0 (K/uL)   Eosinophils Relative 2  0 - 5 (%)   Eosinophils Absolute 0.1  0.0 - 0.7 (K/uL)   Basophils Relative 0  0 - 1 (%)   Basophils Absolute 0.0  0.0 - 0.1 (K/uL)  CK TOTAL AND CKMB      Component Value Range   Total CK 64  7 - 177 (U/L)   CK, MB 2.0  0.3 - 4.0 (ng/mL)   Relative Index RELATIVE INDEX IS INVALID  0.0 - 2.5   COMPREHENSIVE METABOLIC PANEL      Component Value Range   Sodium 137  135 - 145 (mEq/L)   Potassium 3.6  3.5 - 5.1 (mEq/L)   Chloride 103  96 - 112 (mEq/L)   CO2 26  19 -  32 (mEq/L)   Glucose, Bld 104 (*) 70 - 99 (mg/dL)   BUN 13  6 - 23 (mg/dL)   Creatinine, Ser 0.84  0.50 - 1.10  (mg/dL)   Calcium 9.8  8.4 - 10.5 (mg/dL)   Total Protein 7.7  6.0 - 8.3 (g/dL)   Albumin 3.9  3.5 - 5.2 (g/dL)   AST 16  0 - 37 (U/L)   ALT 12  0 - 35 (U/L)   Alkaline Phosphatase 59  39 - 117 (U/L)   Total Bilirubin 0.3  0.3 - 1.2 (mg/dL)   GFR calc non Af Amer 71 (*) >90 (mL/min)   GFR calc Af Amer 82 (*) >90 (mL/min)  TROPONIN I      Component Value Range   Troponin I <0.30  <0.30 (ng/mL)  D-DIMER, QUANTITATIVE      Component Value Range   D-Dimer, Quant 0.35  0.00 - 0.48 (ug/mL-FEU)   Patient is smoker with productive cough.  Plan cough medicine and levaquin. She has normal d-dimer and no obvious infiltrate.           Shaune Pollack, MD 02/06/11 2125

## 2011-03-12 NOTE — Progress Notes (Signed)
Addended by: Everardo All on: 03/12/2011 12:16 PM   Modules accepted: Orders

## 2011-03-25 ENCOUNTER — Other Ambulatory Visit: Payer: Self-pay | Admitting: Internal Medicine

## 2011-03-26 LAB — BASIC METABOLIC PANEL
CO2: 28 mEq/L (ref 19–32)
Chloride: 105 mEq/L (ref 96–112)
Creat: 0.81 mg/dL (ref 0.50–1.10)
Glucose, Bld: 111 mg/dL — ABNORMAL HIGH (ref 70–99)

## 2011-03-28 ENCOUNTER — Ambulatory Visit: Payer: Medicare Other | Admitting: Gastroenterology

## 2011-04-16 ENCOUNTER — Encounter: Payer: Self-pay | Admitting: Gastroenterology

## 2011-04-25 ENCOUNTER — Ambulatory Visit: Payer: Medicare Other | Admitting: Urgent Care

## 2011-04-25 ENCOUNTER — Encounter: Payer: Self-pay | Admitting: Internal Medicine

## 2011-04-29 ENCOUNTER — Encounter: Payer: Self-pay | Admitting: Gastroenterology

## 2011-04-29 ENCOUNTER — Ambulatory Visit (INDEPENDENT_AMBULATORY_CARE_PROVIDER_SITE_OTHER): Payer: PRIVATE HEALTH INSURANCE | Admitting: Gastroenterology

## 2011-04-29 VITALS — BP 141/82 | HR 92 | Temp 98.2°F | Ht 66.0 in | Wt 234.8 lb

## 2011-04-29 DIAGNOSIS — K5190 Ulcerative colitis, unspecified, without complications: Secondary | ICD-10-CM

## 2011-04-29 MED ORDER — AZATHIOPRINE 50 MG PO TABS: 100.0000 mg | ORAL_TABLET | Freq: Every day | ORAL | Status: DC

## 2011-04-29 MED ORDER — MESALAMINE 400 MG PO TBEC: 800.0000 mg | DELAYED_RELEASE_TABLET | Freq: Three times a day (TID) | ORAL | Status: DC

## 2011-04-29 NOTE — Assessment & Plan Note (Signed)
66 year old with UC, doing excellent at this point with Asacol 800 tid and Imuran 100 mg daily. Recent labs in October normal. Will recheck CBC and CMP in 3 mos, will mail this to pt to have done. Continue taking probiotic. TCS surveillance due in Sept 2013 per last notes. Will address this at next visit.   April 2013: CBC, CMP Return in 6 mos or sooner if needed

## 2011-04-29 NOTE — Progress Notes (Signed)
Faxed to PCP

## 2011-04-29 NOTE — Patient Instructions (Signed)
We will see you back in 6 months. Continue to take the Imuran and Asacol as prescribed. Continue the daily probiotic and avoiding NSAIDs such as Ibuprofen, Aleve, Motrin, etc as you are doing.   Keep up the good work!  We will have labs mailed to you in about 3 months to be completed, but we won't see you in our office until 6 months from now.

## 2011-04-29 NOTE — Progress Notes (Signed)
Referring Provider: Erma Pinto, NP Primary Care Physician:  Erma Pinto, NP, NP Primary Gastroenterologist: Dr. [name]   Chief Complaint  Patient presents with  . Follow-up    has alot of gas    HPI:   Bonnie Powers returns today in follow-up for UC. Last seen Sept 21, 2012 by Dr. [name]. Last colonoscopy Sept 2011 with diffuse inflammatory changes of rectum and colon, consistent with pan ulcerative colitis, s/p segmental biopsy. Imuran increased to 100 mg at Sept 2012 visit. States doing excellent, no bloody stool, no abdominal pain. At baseline of 2 BMs per day. Appetite is good. Takes glucosamine prn for knee pain. Avoiding NSAIDs.   Recent labs to include CBC and CMP done October 2012. Normal.  Past Medical History  Diagnosis Date  . Ulcerative colitis 2001    Last colonoscopy Dr [name] 12/28/09-> pan-UC, normal TI, maintained on Asacol 12/2009  . HTN (hypertension)   . OA (osteoarthritis)   . Hypercholesteremia   . Hepatitis 1972    ? unknown type  . Otitis media, chronic   . Basal cell cancer     left temple    Past Surgical History  Procedure Date  . Salpingoophorectomy     right ovary, both tubes  . Tonsillectomy   . Eye surgery   . Colonoscopy 12/28/09    see report/ulcerative colitis    Current Outpatient Prescriptions  Medication Sig Dispense Refill  . acetaminophen (TYLENOL) 650 MG CR tablet Take 650 mg by mouth daily as needed. For pain       . azaTHIOprine (IMURAN) 50 MG tablet Take 100 mg by mouth daily.       . chlorpheniramine-HYDROcodone (TUSSIONEX PENNKINETIC ER) 10-8 MG/5ML LQCR Take 5 mLs by mouth every 12 (twelve) hours as needed.  115 mL  0  . dicyclomine (BENTYL) 10 MG capsule Take 10 mg by mouth as needed. For diarrhea       . enalapril (VASOTEC) 10 MG tablet Take 10 mg by mouth daily.       . Ergocalciferol 2000 UNITS TABS Take 1 tablet by mouth daily.        Marland Kitchen loratadine (CLARITIN) 10 MG tablet Take 10 mg by mouth daily.        . mesalamine  (ASACOL) 400 MG EC tablet Take 2 tablets (800 mg total) by mouth 3 (three) times daily.  180 tablet  5  . Multiple Vitamin (MULTIVITAMIN) capsule Take 1 capsule by mouth every morning.       . Probiotic Product (PROBIOTIC PO) Take 1 tablet by mouth daily.          Allergies as of 04/29/2011 - Review Complete 04/29/2011  Allergen Reaction Noted  . Aspirin  02/06/2011    Family History  Problem Relation Age of Onset  . Skin cancer Father   . Heart failure Mother   . HIV Brother   . Lung cancer Brother     History   Social History  . Marital Status: Divorced    Spouse Name: N/A    Number of Children: 3  . Years of Education: N/A   Occupational History  . retired     Conservator, museum/gallery   Social History Main Topics  . Smoking status: Current Everyday Smoker -- 1.0 packs/day for 30 years    Types: Cigarettes  . Smokeless tobacco: None   Comment: trying  . Alcohol Use: No  . Drug Use: No  . Sexually Active: No   Other Topics  Concern  . None   Social History Narrative   Lost 1 daughter w/ meningitis    Review of Systems: Gen: Denies fever, chills, anorexia. Denies fatigue, weakness, weight loss.  CV: Denies chest pain, palpitations, syncope, peripheral edema, and claudication. Resp: Denies dyspnea at rest, cough, wheezing, coughing up blood, and pleurisy. GI: Denies vomiting blood, jaundice, and fecal incontinence.   Denies dysphagia or odynophagia. Derm: Denies rash, itching, dry skin Psych: Denies depression, anxiety, memory loss, confusion. No homicidal or suicidal ideation.  Heme: Denies bruising, bleeding, and enlarged lymph nodes.  Physical Exam: BP 141/82  Pulse 92  Temp(Src) 98.2 F (36.8 C) (Temporal)  Ht 5' 6"  (1.676 m)  Wt 234 lb 12.8 oz (106.505 kg)  BMI 37.90 kg/m2 General:   Alert and oriented. No distress noted. Pleasant and cooperative.  Head:  Normocephalic and atraumatic. Eyes:  Conjuctiva clear without scleral icterus. Mouth:  Oral mucosa pink  and moist. Good dentition. No lesions. Heart:  S1, S2 present without murmurs, rubs, or gallops. Regular rate and rhythm. Abdomen:  +BS, soft, non-tender and non-distended. No rebound or guarding. No HSM or masses noted. Msk:  Symmetrical without gross deformities. Normal posture. Extremities:  Without edema. Neurologic:  Alert and  oriented x4;  grossly normal neurologically. Skin:  Intact without significant lesions or rashes. Psych:  Alert and cooperative. Normal mood and affect.

## 2011-04-30 ENCOUNTER — Other Ambulatory Visit: Payer: Self-pay | Admitting: Gastroenterology

## 2011-07-22 ENCOUNTER — Other Ambulatory Visit (HOSPITAL_COMMUNITY): Payer: Self-pay | Admitting: Nurse Practitioner

## 2011-07-22 DIAGNOSIS — Z139 Encounter for screening, unspecified: Secondary | ICD-10-CM

## 2011-07-29 ENCOUNTER — Ambulatory Visit: Payer: PRIVATE HEALTH INSURANCE | Admitting: Gastroenterology

## 2011-08-15 ENCOUNTER — Other Ambulatory Visit: Payer: Self-pay | Admitting: Gastroenterology

## 2011-08-15 ENCOUNTER — Ambulatory Visit (HOSPITAL_COMMUNITY)
Admission: RE | Admit: 2011-08-15 | Discharge: 2011-08-15 | Disposition: A | Discharge status: Home / Self Care | Payer: PRIVATE HEALTH INSURANCE | Source: Ambulatory Visit | Attending: Nurse Practitioner | Admitting: Nurse Practitioner

## 2011-08-15 DIAGNOSIS — Z1231 Encounter for screening mammogram for malignant neoplasm of breast: Secondary | ICD-10-CM | POA: Insufficient documentation

## 2011-08-15 DIAGNOSIS — Z139 Encounter for screening, unspecified: Secondary | ICD-10-CM

## 2011-08-15 LAB — CBC WITH DIFFERENTIAL/PLATELET
Basophils Relative: 0 % (ref 0–1)
Eosinophils Absolute: 0.2 10*3/uL (ref 0.0–0.7)
Hemoglobin: 13.9 g/dL (ref 12.0–15.0)
MCH: 27.8 pg (ref 26.0–34.0)
MCHC: 31.8 g/dL (ref 30.0–36.0)
Monocytes Absolute: 1 10*3/uL (ref 0.1–1.0)
Monocytes Relative: 7 % (ref 3–12)
Neutrophils Relative %: 70 % (ref 43–77)
RDW: 16.2 % — ABNORMAL HIGH (ref 11.5–15.5)

## 2011-08-15 LAB — COMPREHENSIVE METABOLIC PANEL
Alkaline Phosphatase: 58 U/L (ref 39–117)
BUN: 17 mg/dL (ref 6–23)
Glucose, Bld: 94 mg/dL (ref 70–99)
Total Bilirubin: 0.4 mg/dL (ref 0.3–1.2)

## 2011-08-19 ENCOUNTER — Other Ambulatory Visit: Payer: Self-pay

## 2011-08-19 DIAGNOSIS — K5190 Ulcerative colitis, unspecified, without complications: Secondary | ICD-10-CM

## 2011-08-19 MED ORDER — MESALAMINE 400 MG PO TBEC: 800.0000 mg | DELAYED_RELEASE_TABLET | Freq: Three times a day (TID) | ORAL | Status: DC

## 2011-08-20 ENCOUNTER — Telehealth: Payer: Self-pay

## 2011-08-20 ENCOUNTER — Other Ambulatory Visit: Payer: Self-pay

## 2011-08-20 MED ORDER — MESALAMINE 800 MG PO TBEC: 1.0000 | DELAYED_RELEASE_TABLET | Freq: Three times a day (TID) | ORAL | Status: DC

## 2011-08-20 NOTE — Telephone Encounter (Signed)
Pharmacist from [facility] in [place] called and said that they no longer make the Asacol 400 mg. Pt needs different prescription now. Please advise or send new prescription to the pharmacy.

## 2011-08-20 NOTE — Telephone Encounter (Signed)
Please let pt know I have completed this.

## 2011-08-20 NOTE — Telephone Encounter (Signed)
That's right. I will send in new prescription.

## 2011-08-21 NOTE — Telephone Encounter (Signed)
Informed pt Rx has been sent to pharmacy.

## 2011-08-22 NOTE — Progress Notes (Signed)
Results Cc to PCP  

## 2011-08-22 NOTE — Progress Notes (Signed)
Quick Note:  WBC up slightly.  PR on pt such as any recent illness, steroids, etc.  Renal function, liver function good.  Recheck CBC in 6 weeks. Keep OV as planned. ______

## 2011-08-22 NOTE — Progress Notes (Signed)
Quick Note:  Ok. Can we send these labs to her PCP?  Thanks! ______

## 2011-09-10 ENCOUNTER — Telehealth: Payer: Self-pay | Admitting: Urgent Care

## 2011-09-10 NOTE — Telephone Encounter (Signed)
Letter mailed to pt.  

## 2011-09-10 NOTE — Telephone Encounter (Signed)
Please send pt letter to remind her she is due for labs unless she has had them done at her PCP & she can bring  a copy. Thanks

## 2011-09-11 ENCOUNTER — Other Ambulatory Visit: Payer: Self-pay

## 2011-09-24 LAB — CBC WITH DIFFERENTIAL/PLATELET
Eosinophils Absolute: 0.1 10*3/uL (ref 0.0–0.7)
Eosinophils Relative: 1 % (ref 0–5)
HCT: 38.6 % (ref 36.0–46.0)
Hemoglobin: 12.6 g/dL (ref 12.0–15.0)
Lymphs Abs: 2.3 10*3/uL (ref 0.7–4.0)
MCH: 27.2 pg (ref 26.0–34.0)
MCV: 83.2 fL (ref 78.0–100.0)
Monocytes Relative: 7 % (ref 3–12)
RBC: 4.64 MIL/uL (ref 3.87–5.11)

## 2011-09-30 NOTE — Progress Notes (Signed)
Quick Note:  Please let pt know her labs are normal with the exception of her platelets being a bit high. We can recheck in 2 months (CBC) Thanks MA:[id number], KATHY, NP  ______

## 2011-10-15 ENCOUNTER — Ambulatory Visit (INDEPENDENT_AMBULATORY_CARE_PROVIDER_SITE_OTHER): Payer: PRIVATE HEALTH INSURANCE | Admitting: Internal Medicine

## 2011-10-15 ENCOUNTER — Other Ambulatory Visit: Payer: Self-pay | Admitting: Internal Medicine

## 2011-10-15 ENCOUNTER — Other Ambulatory Visit: Payer: Self-pay

## 2011-10-15 VITALS — BP 144/75 | HR 109 | Temp 97.5°F | Ht 66.0 in | Wt 224.0 lb

## 2011-10-15 DIAGNOSIS — K5190 Ulcerative colitis, unspecified, without complications: Secondary | ICD-10-CM

## 2011-10-15 NOTE — Progress Notes (Signed)
p 

## 2011-10-15 NOTE — Progress Notes (Signed)
Primary Care Physician:  Erma Pinto, NP Primary Gastroenterologist:  Dr. [name]  Pre-Procedure History & Physical: HPI:  Bonnie Powers is a 66 y.o. 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 Abdominal cramps and upwards of 10 nonbloody stools daily over the past few weeks. On Imuran 100 mg orally daily. Also Aacol800 mg 3 times a day. She is normal TPMT enzyme activity. No NSAIDs recently. No antibiotic exposure this year. No sick exposure. She is due for her to your high risk surveillance colonoscopy in September of this year.   Past Medical History  Diagnosis Date  . Ulcerative colitis 2001    Last colonoscopy Dr [name] 12/28/09-> pan-UC, normal TI, maintained on Asacol 12/2009  . HTN (hypertension)   . OA (osteoarthritis)   . Hypercholesteremia   . Hepatitis 1972    ? unknown type  . Otitis media, chronic   . Basal cell cancer     left temple    Past Surgical History  Procedure Date  . Salpingoophorectomy     right ovary, both tubes  . Tonsillectomy   . Eye surgery   . Colonoscopy 12/28/09    Dr. [name] colitis    Prior to Admission medications   Medication Sig Start Date End Date Taking? Authorizing Provider  acetaminophen (TYLENOL) 650 MG CR tablet Take 650 mg by mouth daily as needed. For pain    Yes Historical Provider, MD  azaTHIOprine (IMURAN) 50 MG tablet Take 2 tablets (100 mg total) by mouth daily. 04/29/11  Yes Orvil Feil, NP  chlorpheniramine-HYDROcodone (TUSSIONEX PENNKINETIC ER) 10-8 MG/5ML LQCR Take 5 mLs by mouth every 12 (twelve) hours as needed. 02/06/11  Yes Shaune Pollack, MD  dicyclomine (BENTYL) 10 MG capsule Take 10 mg by mouth as needed. For diarrhea  08/17/10  Yes Andria Meuse, NP  enalapril (VASOTEC) 10 MG tablet Take 10 mg by mouth daily.    Yes Historical Provider, MD  Ergocalciferol 2000 UNITS TABS Take 1 tablet by mouth daily.     Yes Historical  Provider, MD  loratadine (CLARITIN) 10 MG tablet Take 10 mg by mouth daily.     Yes Historical Provider, MD  Mesalamine (ASACOL HD) 800 MG TBEC Take 1 tablet (800 mg total) by mouth 3 (three) times daily. 08/20/11  Yes Andria Meuse, NP  Multiple Vitamin (MULTIVITAMIN) capsule Take 1 capsule by mouth every morning.    Yes Historical Provider, MD  Probiotic Product (PROBIOTIC PO) Take 1 tablet by mouth daily.     Yes Historical Provider, MD    Allergies as of 10/15/2011 - Review Complete 10/15/2011  Allergen Reaction Noted  . Aspirin  02/06/2011    Family History  Problem Relation Age of Onset  . Skin cancer Father   . Heart failure Mother   . HIV Brother   . Lung cancer Brother     History   Social History  . Marital Status: Divorced    Spouse Name: N/A    Number of Children: 3  . Years of Education: N/A   Occupational History  . retired     Conservator, museum/gallery   Social History Main Topics  . Smoking status: Current Everyday Smoker -- 1.0 packs/day for 30 years    Types: Cigarettes  . Smokeless tobacco: Not on file   Comment: trying  . Alcohol Use: No  . Drug Use: No  . Sexually Active: No  Other Topics Concern  . Not on file   Social History Narrative   Lost 1 daughter w/ meningitis    Review of Systems: See HPI, otherwise negative ROS  Physical Exam: BP 144/75  Pulse 109  Temp 97.5 F (36.4 C) (Temporal)  Ht 5' 6"  (1.676 m)  Wt 224 lb (101.606 kg)  BMI 36.15 kg/m2 General:   Alert,  Well-developed, well-nourished, pleasant and cooperative in NAD Skin:  Intact without significant lesions or rashes. Eyes:  Sclera clear, no icterus.   Conjunctiva pink. Ears:  Normal auditory acuity. Nose:  No deformity, discharge,  or lesions. Mouth:  No deformity or lesions. Neck:  Supple; no masses or thyromegaly. No significant cervical adenopathy. Lungs:  Clear throughout to auscultation.   No wheezes, crackles, or rhonchi. No acute distress. Heart:  Regular rate and  rhythm; no murmurs, clicks, rubs,  or gallops. Abdomen: Non-distended, obese.  Soft and only mild diffuse abdominal tenderness to deep palpation. No mass organomegaly. Pulses:   Normal pulses noted. Extremities:  Without clubbing or edema.  Impression/Plan:   Pleasant 17 year old lady with a 12 year history of universal ulcerative colitis now with apparent flare. She does not appear acutely ill or toxic. I doubt she is superimposed CMV or Cdiff  Recommendations: Continue a Asacol  800 mg 3 times a day; increase Imuran to 150 mg daily. Add Uceris 60m  daily for 3 weeks-samples provided. Stool sample for Clostridium difficile toxin assay PCR.  Continue Bentyl 10 mg a.c. and at bedtime as needed.  Office visit here in 8 weeks. Call if not much better in one week.  Follow a CBC LFTs. Also do not have a copy of the bone density study that her PCP did from 2 years ago. Patient states she was never told of the results. We'll obtain a copy.

## 2011-10-15 NOTE — Patient Instructions (Addendum)
Increase Imuran to 3-50 mg tablets daily (150 mg total daily)  Will check stool for Clostridium difficile infection  Use dicyclomine 10 mg before meals and at bedtime as needed for diarrhea  Continue  Asacol at the current dose  CBC and LFTs  along with a serum creatinine in 2 weeks  Add Uceris 9 mg daily x 3 weeks  Office visit in 8 weeks

## 2011-10-30 ENCOUNTER — Other Ambulatory Visit: Payer: Self-pay | Admitting: Internal Medicine

## 2011-10-31 LAB — CLOSTRIDIUM DIFFICILE EIA: CDIFTX: NEGATIVE

## 2011-11-06 ENCOUNTER — Other Ambulatory Visit: Payer: Self-pay

## 2011-11-06 NOTE — Telephone Encounter (Signed)
Talked to Pt ans she said that RMR upped at the last visit.

## 2011-11-06 NOTE — Telephone Encounter (Signed)
I tried to call Pt but she was not home.

## 2011-11-06 NOTE — Telephone Encounter (Signed)
Please clarify dose. I thought she was on Imuran 134m daily as per last OV note.

## 2011-11-07 LAB — CBC WITH DIFFERENTIAL/PLATELET
Basophils Absolute: 0 10*3/uL (ref 0.0–0.1)
Basophils Relative: 1 % (ref 0–1)
Eosinophils Absolute: 0.1 10*3/uL (ref 0.0–0.7)
Hemoglobin: 12.6 g/dL (ref 12.0–15.0)
MCH: 27 pg (ref 26.0–34.0)
MCHC: 32.4 g/dL (ref 30.0–36.0)
Neutro Abs: 5.9 10*3/uL (ref 1.7–7.7)
Neutrophils Relative %: 68 % (ref 43–77)
Platelets: 380 10*3/uL (ref 150–400)
RDW: 16.9 % — ABNORMAL HIGH (ref 11.5–15.5)

## 2011-11-07 LAB — CREATININE, SERUM: Creat: 0.66 mg/dL (ref 0.50–1.10)

## 2011-11-07 LAB — HEPATIC FUNCTION PANEL
AST: 13 U/L (ref 0–37)
Albumin: 4 g/dL (ref 3.5–5.2)
Bilirubin, Direct: 0.1 mg/dL (ref 0.0–0.3)
Total Bilirubin: 0.4 mg/dL (ref 0.3–1.2)

## 2011-11-07 MED ORDER — AZATHIOPRINE 50 MG PO TABS: 150.0000 mg | ORAL_TABLET | Freq: Every day | ORAL | Status: DC

## 2011-11-19 ENCOUNTER — Other Ambulatory Visit: Payer: Self-pay | Admitting: Internal Medicine

## 2011-11-19 DIAGNOSIS — K5190 Ulcerative colitis, unspecified, without complications: Secondary | ICD-10-CM

## 2011-11-19 NOTE — Progress Notes (Signed)
Faxed to PCP

## 2011-11-20 ENCOUNTER — Other Ambulatory Visit: Payer: Self-pay

## 2011-11-20 DIAGNOSIS — K5190 Ulcerative colitis, unspecified, without complications: Secondary | ICD-10-CM

## 2011-12-10 ENCOUNTER — Ambulatory Visit: Payer: PRIVATE HEALTH INSURANCE | Admitting: Gastroenterology

## 2012-07-14 ENCOUNTER — Other Ambulatory Visit (HOSPITAL_COMMUNITY): Payer: Self-pay | Admitting: Nurse Practitioner

## 2012-07-14 DIAGNOSIS — Z139 Encounter for screening, unspecified: Secondary | ICD-10-CM

## 2012-08-17 ENCOUNTER — Ambulatory Visit (HOSPITAL_COMMUNITY)
Admission: RE | Admit: 2012-08-17 | Discharge: 2012-08-17 | Disposition: A | Discharge status: Home / Self Care | Payer: PRIVATE HEALTH INSURANCE | Source: Ambulatory Visit | Attending: Nurse Practitioner | Admitting: Nurse Practitioner

## 2012-08-17 DIAGNOSIS — Z139 Encounter for screening, unspecified: Secondary | ICD-10-CM

## 2012-08-17 DIAGNOSIS — Z1231 Encounter for screening mammogram for malignant neoplasm of breast: Secondary | ICD-10-CM | POA: Insufficient documentation

## 2012-09-30 ENCOUNTER — Other Ambulatory Visit: Payer: Self-pay | Admitting: Gastroenterology

## 2012-09-30 ENCOUNTER — Encounter: Payer: Self-pay | Admitting: Gastroenterology

## 2012-09-30 ENCOUNTER — Ambulatory Visit (INDEPENDENT_AMBULATORY_CARE_PROVIDER_SITE_OTHER): Payer: PRIVATE HEALTH INSURANCE | Admitting: Gastroenterology

## 2012-09-30 VITALS — BP 134/75 | HR 96 | Temp 97.4°F | Ht 66.0 in | Wt 200.8 lb

## 2012-09-30 DIAGNOSIS — K5190 Ulcerative colitis, unspecified, without complications: Secondary | ICD-10-CM

## 2012-09-30 LAB — COMPREHENSIVE METABOLIC PANEL
AST: 10 U/L (ref 0–37)
Albumin: 3.8 g/dL (ref 3.5–5.2)
BUN: 8 mg/dL (ref 6–23)
Calcium: 9.6 mg/dL (ref 8.4–10.5)
Chloride: 106 mEq/L (ref 96–112)
Creat: 0.74 mg/dL (ref 0.50–1.10)
Glucose, Bld: 92 mg/dL (ref 70–99)
Potassium: 4.2 mEq/L (ref 3.5–5.3)

## 2012-09-30 LAB — CBC WITH DIFFERENTIAL/PLATELET
Basophils Absolute: 0.1 10*3/uL (ref 0.0–0.1)
Eosinophils Relative: 3 % (ref 0–5)
HCT: 36.5 % (ref 36.0–46.0)
Hemoglobin: 11.7 g/dL — ABNORMAL LOW (ref 12.0–15.0)
Lymphocytes Relative: 19 % (ref 12–46)
MCHC: 32.1 g/dL (ref 30.0–36.0)
MCV: 79 fL (ref 78.0–100.0)
Monocytes Absolute: 0.7 10*3/uL (ref 0.1–1.0)
Monocytes Relative: 8 % (ref 3–12)
Neutro Abs: 6.2 10*3/uL (ref 1.7–7.7)
RDW: 17.2 % — ABNORMAL HIGH (ref 11.5–15.5)
WBC: 8.9 10*3/uL (ref 4.0–10.5)

## 2012-09-30 MED ORDER — AZATHIOPRINE 50 MG PO TABS: 150.0000 mg | ORAL_TABLET | Freq: Every day | ORAL | Status: DC

## 2012-09-30 MED ORDER — MESALAMINE 800 MG PO TBEC: 1.0000 | DELAYED_RELEASE_TABLET | Freq: Three times a day (TID) | ORAL | Status: DC

## 2012-09-30 MED ORDER — BUDESONIDE 9 MG PO TB24: 9.0000 mg | ORAL_TABLET | Freq: Every day | ORAL | Status: DC

## 2012-09-30 MED ORDER — DICYCLOMINE HCL 10 MG PO CAPS: 10.0000 mg | ORAL_CAPSULE | Freq: Four times a day (QID) | ORAL | Status: DC | PRN

## 2012-09-30 NOTE — Assessment & Plan Note (Signed)
UC flare off medications. First we will refill her medications (Asacol HD, Imuran, Bentyl) to see what the coverage issues are. Patient aware. We have provided her with Asacol HD samples. She will start Uceris for her flare. 32m daily for four weeks. RX given. She would like to have her colonoscopy in 12/2012 due to ongoing travel/vacation she has planned. She was advised to call at least one month in advance for OV. She is due labs at this time. If her flare does not settle down, she will let [place].

## 2012-09-30 NOTE — Progress Notes (Signed)
CC PCP 

## 2012-09-30 NOTE — Patient Instructions (Addendum)
1. I sent prescription for dicyclomine, Asacol HD, Imuran to your pharmacy so the nurse can check on coverage/expense. 2. Samples of Asacol HD provided today. 3. Add Uceris one tablet daily for four weeks. We did not have samples but RX sent to pharmacy. Let us know if it is not covered.

## 2012-09-30 NOTE — Progress Notes (Signed)
Primary Care Physician:  Erma Pinto, FNP  Primary Gastroenterologist:  Garfield Cornea, MD   Chief Complaint  Patient presents with  . Follow-up    HPI:  Bonnie Powers is a 67 y.o. female here for followup of ulcerative colitis. 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 She was last seen in July of 2013 with a flareup. At that time she was given Uceris for 3 weeks. Imuran was increased to 150 mg daily and she was maintained on Asacol HD 800 mg 3 times a day. She states that she was doing well until she ran out of her medication couple months ago. She has had increased stress related to husband's illness and financial concerns. She's been having more than 10 watery nonbloody stools daily. She also notes in general if she overeats her diarrhea is worse. She's been more careful with her diet over the past 14 months and has had an intentional 35 pound weight loss. She denies any blood in the stool but has occasional toilet tissue hematochezia about 3 times per year. Denies abdominal pain, vomiting, heartburn, dysphagia.   No NSAIDs recently. She is overdue for her to your high risk surveillance colonoscopy but would like to put it off until September of this year.   She states she's had some issues with getting her medications. She receive c and that she cannot get #90 dicyclomine in a month. She also reports that her Asacol HD was not covered anymore.    Current Outpatient Prescriptions  Medication Sig Dispense Refill  . acetaminophen (TYLENOL) 650 MG CR tablet Take 650 mg by mouth daily as needed. For pain       . dicyclomine (BENTYL) 10 MG capsule Take 10 mg by mouth as needed. For diarrhea       . enalapril (VASOTEC) 10 MG tablet Take 10 mg by mouth daily.       . Ergocalciferol 2000 UNITS TABS Take 1 tablet by mouth daily.        Marland Kitchen loratadine (CLARITIN) 10 MG tablet Take 10 mg by mouth daily.        . Multiple Vitamin (MULTIVITAMIN)  capsule Take 1 capsule by mouth every morning.       . Probiotic Product (PROBIOTIC PO) Take 1 tablet by mouth daily.        Marland Kitchen azaTHIOprine (IMURAN) 50 MG tablet Take 3 tablets (150 mg total) by mouth daily. Take 3 tablets by mouth daily  90 tablet  5  . Mesalamine (ASACOL HD) 800 MG TBEC Take 1 tablet (800 mg total) by mouth 3 (three) times daily.  90 tablet  5   No current facility-administered medications for this visit.    Allergies as of 09/30/2012 - Review Complete 09/30/2012  Allergen Reaction Noted  . Aspirin  02/06/2011    Past Medical History  Diagnosis Date  . Ulcerative colitis 2001    Last colonoscopy Dr [name] 12/28/09-> pan-UC, normal TI, maintained on Asacol 12/2009  . HTN (hypertension)   . OA (osteoarthritis)   . Hypercholesteremia   . Hepatitis 1972    ? unknown type  . Otitis media, chronic   . Basal cell cancer     left temple    Past Surgical History  Procedure Laterality Date  . Salpingoophorectomy      right ovary, both tubes  . Tonsillectomy    . Eye surgery    . Colonoscopy  12/28/09    Dr. [name]  colitis    Family History  Problem Relation Age of Onset  . Skin cancer Father   . Heart failure Mother   . HIV Brother   . Lung cancer Brother     History   Social History  . Marital Status: Divorced    Spouse Name: N/A    Number of Children: 3  . Years of Education: N/A   Occupational History  . retired     Conservator, museum/gallery   Social History Main Topics  . Smoking status: Current Every Day Smoker -- 1.00 packs/day for 30 years    Types: Cigarettes  . Smokeless tobacco: Not on file     Comment: trying  . Alcohol Use: No  . Drug Use: No  . Sexually Active: No   Other Topics Concern  . Not on file   Social History Narrative   Lost 1 daughter w/ meningitis      ROS:  General: Negative for anorexia, weight loss, fever, chills, fatigue, weakness. Eyes: Negative for vision changes.  ENT: Negative for hoarseness, difficulty  swallowing , nasal congestion. CV: Negative for chest pain, angina, palpitations, dyspnea on exertion, peripheral edema.  Respiratory: Negative for dyspnea at rest, dyspnea on exertion, cough, sputum, wheezing.  GI: See history of present illness. GU:  Negative for dysuria, hematuria, urinary incontinence, urinary frequency, nocturnal urination.  MS: + for joint pain, low back pain.  Derm: Negative for rash or itching.  Neuro: Negative for weakness, abnormal sensation, seizure, frequent headaches, memory loss, confusion.  Psych: Negative for anxiety, depression, suicidal ideation, hallucinations.  Endo: Negative for unusual weight change.  Heme: Negative for bruising or bleeding. Allergy: Negative for rash or hives.    Physical Examination:  BP 134/75  Pulse 96  Temp(Src) 97.4 F (36.3 C) (Oral)  Ht 5' 6"  (1.676 m)  Wt 200 lb 12.8 oz (91.082 kg)  BMI 32.43 kg/m2   General: Well-nourished, well-developed in no acute distress.  Head: Normocephalic, atraumatic.   Eyes: Conjunctiva pink, no icterus. Mouth: Oropharyngeal mucosa moist and pink , no lesions erythema or exudate. Neck: Supple without thyromegaly, masses, or lymphadenopathy.  Lungs: Clear to auscultation bilaterally.  Heart: Regular rate and rhythm, no murmurs rubs or gallops.  Abdomen: Bowel sounds are normal, nontender, nondistended, no hepatosplenomegaly or masses, no abdominal bruits or    hernia , no rebound or guarding.   Rectal: not performed Extremities: No lower extremity edema. No clubbing or deformities.  Neuro: Alert and oriented x 4 , grossly normal neurologically.  Skin: Warm and dry, no rash or jaundice.   Psych: Alert and cooperative, normal mood and affect.

## 2012-10-05 ENCOUNTER — Telehealth: Payer: Self-pay | Admitting: Gastroenterology

## 2012-10-05 ENCOUNTER — Other Ambulatory Visit: Payer: Self-pay | Admitting: Gastroenterology

## 2012-10-05 DIAGNOSIS — K5190 Ulcerative colitis, unspecified, without complications: Secondary | ICD-10-CM

## 2012-10-05 NOTE — Progress Notes (Signed)
Quick Note:  Overall her labs are good. Slightly low Hgb. Plan as per OV. Repeat CBC in 4-6 weeks. ______

## 2012-10-05 NOTE — Telephone Encounter (Signed)
pts formulary on [facility] cart.

## 2012-10-05 NOTE — Telephone Encounter (Signed)
Patient's copay on her Asacol HD in $70.22 per month. Please see if her insurance has cheaper mesalamine alternative.

## 2012-10-06 NOTE — Telephone Encounter (Signed)
Pt is aware and will call back if she wants to switch to a different medication.  Manuela Schwartz, please nic ov in 3 months.

## 2012-10-06 NOTE — Telephone Encounter (Signed)
Please let the patient know.  Once she completes uceris and is back in remission, we could try alternative to Asacol HD which may be cheaper.   I would not want to switch her until her symptoms are under control.  Options are:  Apriso four capsules PO QAM Balsalazide 3 PO TID  Please let her know. She can call when her symptoms are better and then we can switch her if she would like.  OV in 3 months.

## 2012-10-13 NOTE — Telephone Encounter (Signed)
Pt is aware of OV on 9/8 at 0930 with [facility] and appt card was mailed

## 2012-10-20 ENCOUNTER — Other Ambulatory Visit: Payer: Self-pay

## 2012-10-20 DIAGNOSIS — K5190 Ulcerative colitis, unspecified, without complications: Secondary | ICD-10-CM

## 2012-11-03 LAB — CBC WITH DIFFERENTIAL/PLATELET
Basophils Relative: 0 % (ref 0–1)
HCT: 35.6 % — ABNORMAL LOW (ref 36.0–46.0)
Hemoglobin: 11.5 g/dL — ABNORMAL LOW (ref 12.0–15.0)
Lymphocytes Relative: 26 % (ref 12–46)
Lymphs Abs: 1.8 10*3/uL (ref 0.7–4.0)
Monocytes Absolute: 0.5 10*3/uL (ref 0.1–1.0)
Monocytes Relative: 8 % (ref 3–12)
Neutro Abs: 4.4 10*3/uL (ref 1.7–7.7)
Neutrophils Relative %: 64 % (ref 43–77)
RBC: 4.44 MIL/uL (ref 3.87–5.11)
WBC: 6.9 10*3/uL (ref 4.0–10.5)

## 2012-11-09 NOTE — Progress Notes (Signed)
Quick Note:  Pt is aware.  Lab order on file. Pt stated she already has an appointment scheduled for 12/2012 ______

## 2012-11-09 NOTE — Progress Notes (Signed)
Quick Note:  H/H stable, slightly low. CBC in 2 months.  OV in 12/2012 as planned. ______

## 2012-11-17 ENCOUNTER — Other Ambulatory Visit: Payer: Self-pay

## 2012-11-17 DIAGNOSIS — K625 Hemorrhage of anus and rectum: Secondary | ICD-10-CM

## 2012-11-17 DIAGNOSIS — K5190 Ulcerative colitis, unspecified, without complications: Secondary | ICD-10-CM

## 2012-11-30 ENCOUNTER — Other Ambulatory Visit: Payer: Self-pay

## 2012-11-30 DIAGNOSIS — K625 Hemorrhage of anus and rectum: Secondary | ICD-10-CM

## 2012-11-30 DIAGNOSIS — K5190 Ulcerative colitis, unspecified, without complications: Secondary | ICD-10-CM

## 2012-12-14 ENCOUNTER — Encounter: Payer: Self-pay | Admitting: Gastroenterology

## 2012-12-14 ENCOUNTER — Ambulatory Visit (INDEPENDENT_AMBULATORY_CARE_PROVIDER_SITE_OTHER): Payer: PRIVATE HEALTH INSURANCE | Admitting: Gastroenterology

## 2012-12-14 VITALS — BP 171/83 | HR 93 | Temp 98.3°F | Ht 66.0 in | Wt 186.8 lb

## 2012-12-14 DIAGNOSIS — K5190 Ulcerative colitis, unspecified, without complications: Secondary | ICD-10-CM

## 2012-12-14 DIAGNOSIS — J019 Acute sinusitis, unspecified: Secondary | ICD-10-CM | POA: Insufficient documentation

## 2012-12-14 DIAGNOSIS — K5100 Ulcerative (chronic) pancolitis without complications: Secondary | ICD-10-CM

## 2012-12-14 MED ORDER — AMOXICILLIN-POT CLAVULANATE 875-125 MG PO TABS: 1.0000 | ORAL_TABLET | Freq: Two times a day (BID) | ORAL | Status: DC

## 2012-12-14 NOTE — Progress Notes (Signed)
CC'd to PCP 

## 2012-12-14 NOTE — Assessment & Plan Note (Signed)
Back in remission. She has completed use Uceris. Currently on Imuran and Asacol HD. She would like to see if any other mesalamines are cheaper for her. Apriso and Delzicol are both in her preferred level I list. She will contact her pharmacy to see if will be cheaper. She requested I not send in new RX at this time. She is due for TCS. She will call and schedule once she feels better from sinusitis. She is aware of the need to schedule within 30 days.

## 2012-12-14 NOTE — Assessment & Plan Note (Signed)
Augmentin 834m BID for 7 days.

## 2012-12-14 NOTE — Patient Instructions (Addendum)
1. I have sent her antibiotic for sinusitis. Augmentin take 1 twice daily for 7 days. 2. If you would like to see if any other mesalamine preparations are cheaper for you, I would recommend trying one of the following. 1. Delzicol 49m take 2 three times per day (6 per day). Need 180 per month. This medication is made by Asacol company! 2. Apriso 0.375gram take 4 per day. Need 120 per month.

## 2012-12-14 NOTE — Progress Notes (Signed)
Primary Care Physician: Erma Pinto, FNP  Primary Gastroenterologist:  Garfield Cornea, MD   Chief Complaint  Patient presents with  . Follow-up    HPI: Bonnie Powers is a 67 y.o. female here for followup. She was seen in June with complaints of UC flare. She had ran out of her medication at the time. She has had an intentional weight loss over the past 18 months. She is down an additional 15 pounds (total of around 50 pounds). Her bowel movements are back at baseline. She has about 2-3 every morning. Denies any blood in the stool. Denies any abdominal pain. No vomiting or heartburn. She is overdue for surveillance colonoscopy at this time however she is more worried about couple week history of sinusitis. States she cannot get an appointment to see her PCP for another 2 weeks. Plans to get her blood work done today.    Current Outpatient Prescriptions  Medication Sig Dispense Refill  . acetaminophen (TYLENOL) 650 MG CR tablet Take 650 mg by mouth daily as needed. For pain       . azaTHIOprine (IMURAN) 50 MG tablet TAKE 3 TABLETS BY MOUTH EVERY DAY  270 tablet  3  . dicyclomine (BENTYL) 10 MG capsule Take 1 capsule (10 mg total) by mouth 4 (four) times daily as needed. For diarrhea  90 capsule  3  . enalapril (VASOTEC) 10 MG tablet Take 10 mg by mouth daily.       . Ergocalciferol 2000 UNITS TABS Take 1 tablet by mouth daily.        Marland Kitchen loratadine (CLARITIN) 10 MG tablet Take 10 mg by mouth daily.        . Mesalamine (ASACOL HD) 800 MG TBEC Take 1 tablet (800 mg total) by mouth 3 (three) times daily.  90 tablet  5  . Multiple Vitamin (MULTIVITAMIN) capsule Take 1 capsule by mouth every morning.       . Probiotic Product (PROBIOTIC PO) Take 1 tablet by mouth daily.         No current facility-administered medications for this visit.    Allergies as of 12/14/2012 - Review Complete 12/14/2012  Allergen Reaction Noted  . Aspirin  02/06/2011   Past Medical History  Diagnosis Date  .  Ulcerative colitis 2001    Last colonoscopy Dr [name] 12/28/09-> pan-UC, normal TI, maintained on Asacol and Imuran  . HTN (hypertension)   . OA (osteoarthritis)   . Hypercholesteremia   . Hepatitis 1972    ? unknown type  . Otitis media, chronic   . Basal cell cancer     left temple   Past Surgical History  Procedure Laterality Date  . Salpingoophorectomy      right ovary, both tubes  . Tonsillectomy    . Eye surgery    . Colonoscopy  12/28/09    Dr. [name] colitis   Family History  Problem Relation Age of Onset  . Skin cancer Father   . Heart failure Mother   . HIV Brother   . Lung cancer Brother    History   Social History  . Marital Status: Divorced    Spouse Name: N/A    Number of Children: 3  . Years of Education: N/A   Occupational History  . retired     Conservator, museum/gallery   Social History Main Topics  . Smoking status: Current Every Day Smoker -- 1.00 packs/day for 30 years    Types: Cigarettes  . Smokeless tobacco: None  Comment: trying  . Alcohol Use: No  . Drug Use: No  . Sexual Activity: No   Other Topics Concern  . None   Social History Narrative   Lost 1 daughter w/ meningitis    ROS:  General: Negative for anorexia, weight loss, fever, chills, fatigue, weakness. ENT: Negative for hoarseness, difficulty swallowing. Positive for nasal congestion. CV: Negative for chest pain, angina, palpitations, dyspnea on exertion, peripheral edema.  Respiratory: Negative for dyspnea at rest, dyspnea on exertion, cough, sputum, wheezing.  GI: See history of present illness. GU:  Negative for dysuria, hematuria, urinary incontinence, urinary frequency, nocturnal urination.  Endo: Negative for unusual weight change.    Physical Examination:   BP 171/83  Pulse 93  Temp(Src) 98.3 F (36.8 C) (Oral)  Ht 5' 6"  (1.676 m)  Wt 186 lb 12.8 oz (84.732 kg)  BMI 30.16 kg/m2  General: Well-nourished, well-developed in no acute distress.  Eyes: No  icterus. Mouth: Oropharyngeal mucosa moist and pink , no lesions erythema or exudate. Nasal drainage, yellow. Lungs: Clear to auscultation bilaterally.  Heart: Regular rate and rhythm, no murmurs rubs or gallops.  Abdomen: Bowel sounds are normal, nontender, nondistended, no hepatosplenomegaly or masses, no abdominal bruits or hernia , no rebound or guarding.   Extremities: No lower extremity edema. No clubbing or deformities. Neuro: Alert and oriented x 4   Skin: Warm and dry, no jaundice.   Psych: Alert and cooperative, normal mood and affect.

## 2012-12-15 LAB — CBC WITH DIFFERENTIAL/PLATELET
Eosinophils Relative: 2 % (ref 0–5)
Hemoglobin: 12 g/dL (ref 12.0–15.0)
Lymphocytes Relative: 24 % (ref 12–46)
Lymphs Abs: 1.8 10*3/uL (ref 0.7–4.0)
MCH: 26.5 pg (ref 26.0–34.0)
MCV: 82.5 fL (ref 78.0–100.0)
Monocytes Relative: 6 % (ref 3–12)
Platelets: 457 10*3/uL — ABNORMAL HIGH (ref 150–400)
RBC: 4.52 MIL/uL (ref 3.87–5.11)
WBC: 7.5 10*3/uL (ref 4.0–10.5)

## 2012-12-17 NOTE — Progress Notes (Signed)
Quick Note:  H/H better! Plan as outlined at time of OV. ______

## 2012-12-18 ENCOUNTER — Telehealth: Payer: Self-pay | Admitting: Internal Medicine

## 2012-12-18 NOTE — Telephone Encounter (Signed)
Pt called this morning. She was returning a missed call from JL. I told her that JL had to step out for a few minutes, but would be back shortly.

## 2012-12-18 NOTE — Progress Notes (Signed)
Quick Note:  Tried to call pt- NA ______

## 2012-12-18 NOTE — Telephone Encounter (Signed)
Spoke with pt

## 2013-02-22 ENCOUNTER — Telehealth: Payer: Self-pay | Admitting: Internal Medicine

## 2013-02-22 NOTE — Telephone Encounter (Signed)
Routing to LSL for the Imuran.

## 2013-02-22 NOTE — Telephone Encounter (Signed)
Pt called this morning asking if we could call in something different other than Imuran. Her pharmacy is having problems with filling this rx for her and she would rather get something else in place of it that they do have. She also said that the pharmacy should have faxed [place] something for her diarrhea medicine to have a Prior Authorization done. She usese [facility] in [place] and their number in [phone] and the patient's number is [phone]

## 2013-02-22 NOTE — Telephone Encounter (Signed)
Working on [place] for dicyclomine.

## 2013-02-23 NOTE — Telephone Encounter (Signed)
Bonnie Powers, please find out exactly what the patient's request is and what meds she is on for the UC. She should be on BOTH Imuran (has been on for awhile) and a mesalamine. See last OV note.

## 2013-02-24 NOTE — Telephone Encounter (Signed)
Spoke with pt- she has plenty of asachol but the imuran is on manufacturers back order and she is out right now and unable to get it. She wants to know if its going to be all right if she only takes asachol or if there is something else like imuran that she can take.  Working on PA for dicyclomine- she is not out of this yet.

## 2013-02-26 NOTE — Telephone Encounter (Signed)
Please ask pharmacy if they know how long it will be before Imuran is available. She may can go to another pharmacy to fill if we can find one that still has some in stock. It is not an easy switch.

## 2013-03-02 NOTE — Telephone Encounter (Signed)
Spoke with Jenny Reichmann at [facility] in [place] ([place]). He said the imuran was on manufacturers back order and they have no idea when they will be able to get any.

## 2013-03-03 NOTE — Telephone Encounter (Signed)
Please check with another pharmacy to see if they have some in stock as recommended 02/26/13.  I will address further with RMR first of next week.  Continue Asacol for now.

## 2013-03-08 NOTE — Telephone Encounter (Signed)
What is status of follow-up on this one?

## 2013-03-09 NOTE — Telephone Encounter (Signed)
[facility] in [place] has the Imuran. There phone number is [phone]. Do we need to call it in for her?

## 2013-03-10 MED ORDER — AZATHIOPRINE 50 MG PO TABS: ORAL_TABLET | ORAL | Status: DC

## 2013-03-10 NOTE — Telephone Encounter (Signed)
Tried to cal patient- [facility]

## 2013-03-10 NOTE — Addendum Note (Signed)
Addended by: Mahala Menghini on: 03/10/2013 10:13 AM   Modules accepted: Orders

## 2013-03-10 NOTE — Telephone Encounter (Signed)
I sent RX to [facility]. Please let patient know.

## 2013-04-09 NOTE — Progress Notes (Signed)
REVIEWED.  

## 2013-04-16 ENCOUNTER — Other Ambulatory Visit: Payer: Self-pay | Admitting: Gastroenterology

## 2013-06-01 ENCOUNTER — Telehealth: Payer: Self-pay | Admitting: Internal Medicine

## 2013-06-01 NOTE — Telephone Encounter (Signed)
Pt said that her new insurance allowed her to get one refill of Asacol and doesn't understand why she can't continue getting refills and also is there something else that we can prescribe that her insurance will cover. Please advise and call her at [phone]

## 2013-06-04 NOTE — Telephone Encounter (Signed)
PA for Asachol is still pending.

## 2013-06-04 NOTE — Telephone Encounter (Signed)
Tried to do a PA for pts Asachol. [employer] denied, stating it wasn't on their formulary. I have paperwork on my desk, we can either change medication (balsalazide, mesalamine listed on paper) or write an appeal letter.

## 2013-06-07 MED ORDER — MESALAMINE 400 MG PO CPDR: 800.0000 mg | DELAYED_RELEASE_CAPSULE | Freq: Three times a day (TID) | ORAL | Status: DC

## 2013-06-07 NOTE — Telephone Encounter (Signed)
RX for generic mesalamine sent to [facility] in [place].  New dose two capsule three times daily.

## 2013-06-07 NOTE — Addendum Note (Signed)
Addended by: Mahala Menghini on: 06/07/2013 01:07 PM   Modules accepted: Orders, Medications

## 2013-06-08 NOTE — Telephone Encounter (Signed)
Tried to call pt- NA- LM on voicemail with instructions.

## 2013-06-14 ENCOUNTER — Telehealth: Payer: Self-pay | Admitting: *Deleted

## 2013-06-14 NOTE — Telephone Encounter (Signed)
Pt called saying she has switched to [employer] and they will not pay for eelzicoler 420m and it is a one month temporary fill. Please advise [phone] said she will be gone for the next 3 hours so if you don't get her and she will be home tomorrow.

## 2013-07-07 NOTE — Telephone Encounter (Signed)
Patient is calling back again regarding medications, please advise

## 2013-07-07 NOTE — Telephone Encounter (Signed)
In Feb we tried to do a PA for asachol HD- it was denied and I received a letter stating generic mesalamine was covered. That rx was sent in and pt has received a letter stating that it wasn't covered either. I called [employer] and was told that just because it was on the letter didn't mean it didn't need a PA, when I asked her if I should proceed with a PA for the medication she informed me that she has looked and now that medication isnt on the formulary either. I then asked her what was covered and she told me that I would have to speak with a pharmacist about that and they dont come in until 10am and I would have to call them at [phone]

## 2013-07-15 NOTE — Telephone Encounter (Signed)
Spoke with [employer]- they will only cover balsalazide. If pt needs to be on asachol HD then an appeal letter will have to be done. Spoke with AS and she will do a letter.

## 2013-07-20 ENCOUNTER — Encounter: Payer: Self-pay | Admitting: Gastroenterology

## 2013-07-20 NOTE — Telephone Encounter (Signed)
Letter has been faxed to [facility].

## 2013-07-20 NOTE — Telephone Encounter (Signed)
Completed.

## 2013-07-26 NOTE — Telephone Encounter (Signed)
Got a call from [facility] at  [facility] today, she stated the Asachol HD has been approved. Pt is aware.

## 2013-08-10 ENCOUNTER — Other Ambulatory Visit (HOSPITAL_COMMUNITY): Payer: Self-pay | Admitting: Family Medicine

## 2013-08-10 DIAGNOSIS — Z1231 Encounter for screening mammogram for malignant neoplasm of breast: Secondary | ICD-10-CM

## 2013-08-19 ENCOUNTER — Ambulatory Visit (HOSPITAL_COMMUNITY)
Admission: RE | Admit: 2013-08-19 | Discharge: 2013-08-19 | Disposition: A | Discharge status: Home / Self Care | Payer: Medicare HMO | Source: Ambulatory Visit | Attending: Family Medicine | Admitting: Family Medicine

## 2013-08-19 DIAGNOSIS — Z1231 Encounter for screening mammogram for malignant neoplasm of breast: Secondary | ICD-10-CM | POA: Insufficient documentation

## 2013-08-25 ENCOUNTER — Other Ambulatory Visit (HOSPITAL_COMMUNITY): Payer: Self-pay | Admitting: Family Medicine

## 2013-08-25 DIAGNOSIS — M791 Myalgia, unspecified site: Secondary | ICD-10-CM

## 2013-08-25 DIAGNOSIS — M810 Age-related osteoporosis without current pathological fracture: Secondary | ICD-10-CM

## 2013-08-25 DIAGNOSIS — IMO0001 Reserved for inherently not codable concepts without codable children: Secondary | ICD-10-CM

## 2013-09-06 ENCOUNTER — Ambulatory Visit (HOSPITAL_COMMUNITY)
Admission: RE | Admit: 2013-09-06 | Discharge: 2013-09-06 | Disposition: A | Discharge status: Home / Self Care | Payer: Medicare HMO | Source: Ambulatory Visit | Attending: Family Medicine | Admitting: Family Medicine

## 2013-09-06 DIAGNOSIS — IMO0001 Reserved for inherently not codable concepts without codable children: Secondary | ICD-10-CM

## 2013-09-06 DIAGNOSIS — R059 Cough, unspecified: Secondary | ICD-10-CM | POA: Insufficient documentation

## 2013-09-06 DIAGNOSIS — R05 Cough: Secondary | ICD-10-CM | POA: Insufficient documentation

## 2013-09-06 DIAGNOSIS — M810 Age-related osteoporosis without current pathological fracture: Secondary | ICD-10-CM | POA: Insufficient documentation

## 2013-09-06 DIAGNOSIS — M791 Myalgia, unspecified site: Secondary | ICD-10-CM

## 2014-02-21 ENCOUNTER — Telehealth: Payer: Self-pay | Admitting: Internal Medicine

## 2014-02-21 DIAGNOSIS — R197 Diarrhea, unspecified: Secondary | ICD-10-CM

## 2014-02-21 NOTE — Telephone Encounter (Signed)
Let's get stool studies in interim. Orders placed.

## 2014-02-21 NOTE — Telephone Encounter (Signed)
Pt called this morning to make OV. She is having a flare up with her colitis and our next available with [facility] would be 12/22. Per GF I put patient in URG spot for 11/19 at 1130 with LSL.

## 2014-02-21 NOTE — Telephone Encounter (Signed)
LMOM to come by to pick up stool containers

## 2014-02-21 NOTE — Addendum Note (Signed)
Addended by: Mahala Menghini on: 02/21/2014 12:18 PM   Modules accepted: Orders

## 2014-02-21 NOTE — Telephone Encounter (Signed)
Pt is having very bad diarrhea and cramping, Has been going on for the past week off and on. She has low grade fever at times with it.

## 2014-02-22 ENCOUNTER — Encounter (HOSPITAL_COMMUNITY): Payer: Self-pay | Admitting: *Deleted

## 2014-02-22 ENCOUNTER — Inpatient Hospital Stay (HOSPITAL_COMMUNITY)
Admission: EM | Admit: 2014-02-22 | Discharge: 2014-03-02 | DRG: 234 | Disposition: A | Discharge status: Home / Self Care | Payer: Medicare HMO | Attending: Cardiothoracic Surgery | Admitting: Cardiothoracic Surgery

## 2014-02-22 ENCOUNTER — Emergency Department (HOSPITAL_COMMUNITY): Payer: Medicare HMO

## 2014-02-22 ENCOUNTER — Telehealth: Payer: Self-pay

## 2014-02-22 ENCOUNTER — Other Ambulatory Visit: Payer: Self-pay

## 2014-02-22 DIAGNOSIS — F329 Major depressive disorder, single episode, unspecified: Secondary | ICD-10-CM | POA: Diagnosis present

## 2014-02-22 DIAGNOSIS — J449 Chronic obstructive pulmonary disease, unspecified: Secondary | ICD-10-CM | POA: Diagnosis present

## 2014-02-22 DIAGNOSIS — I249 Acute ischemic heart disease, unspecified: Secondary | ICD-10-CM

## 2014-02-22 DIAGNOSIS — E780 Pure hypercholesterolemia: Secondary | ICD-10-CM | POA: Diagnosis present

## 2014-02-22 DIAGNOSIS — I2510 Atherosclerotic heart disease of native coronary artery without angina pectoris: Secondary | ICD-10-CM

## 2014-02-22 DIAGNOSIS — D62 Acute posthemorrhagic anemia: Secondary | ICD-10-CM | POA: Diagnosis not present

## 2014-02-22 DIAGNOSIS — Z6833 Body mass index (BMI) 33.0-33.9, adult: Secondary | ICD-10-CM

## 2014-02-22 DIAGNOSIS — E785 Hyperlipidemia, unspecified: Secondary | ICD-10-CM | POA: Diagnosis present

## 2014-02-22 DIAGNOSIS — E8770 Fluid overload, unspecified: Secondary | ICD-10-CM | POA: Diagnosis present

## 2014-02-22 DIAGNOSIS — Z951 Presence of aortocoronary bypass graft: Secondary | ICD-10-CM

## 2014-02-22 DIAGNOSIS — I10 Essential (primary) hypertension: Secondary | ICD-10-CM | POA: Diagnosis present

## 2014-02-22 DIAGNOSIS — K5190 Ulcerative colitis, unspecified, without complications: Secondary | ICD-10-CM | POA: Diagnosis present

## 2014-02-22 DIAGNOSIS — E119 Type 2 diabetes mellitus without complications: Secondary | ICD-10-CM | POA: Diagnosis present

## 2014-02-22 DIAGNOSIS — I214 Non-ST elevation (NSTEMI) myocardial infarction: Principal | ICD-10-CM

## 2014-02-22 DIAGNOSIS — Z79899 Other long term (current) drug therapy: Secondary | ICD-10-CM

## 2014-02-22 DIAGNOSIS — E876 Hypokalemia: Secondary | ICD-10-CM

## 2014-02-22 DIAGNOSIS — I4891 Unspecified atrial fibrillation: Secondary | ICD-10-CM | POA: Diagnosis not present

## 2014-02-22 DIAGNOSIS — K759 Inflammatory liver disease, unspecified: Secondary | ICD-10-CM | POA: Diagnosis present

## 2014-02-22 DIAGNOSIS — M1990 Unspecified osteoarthritis, unspecified site: Secondary | ICD-10-CM | POA: Diagnosis present

## 2014-02-22 DIAGNOSIS — R079 Chest pain, unspecified: Secondary | ICD-10-CM | POA: Diagnosis not present

## 2014-02-22 DIAGNOSIS — D649 Anemia, unspecified: Secondary | ICD-10-CM

## 2014-02-22 DIAGNOSIS — R197 Diarrhea, unspecified: Secondary | ICD-10-CM | POA: Diagnosis present

## 2014-02-22 DIAGNOSIS — E669 Obesity, unspecified: Secondary | ICD-10-CM | POA: Diagnosis present

## 2014-02-22 DIAGNOSIS — I25110 Atherosclerotic heart disease of native coronary artery with unstable angina pectoris: Secondary | ICD-10-CM | POA: Diagnosis present

## 2014-02-22 DIAGNOSIS — Z8249 Family history of ischemic heart disease and other diseases of the circulatory system: Secondary | ICD-10-CM

## 2014-02-22 DIAGNOSIS — Z720 Tobacco use: Secondary | ICD-10-CM

## 2014-02-22 DIAGNOSIS — F419 Anxiety disorder, unspecified: Secondary | ICD-10-CM | POA: Diagnosis present

## 2014-02-22 DIAGNOSIS — Z886 Allergy status to analgesic agent status: Secondary | ICD-10-CM

## 2014-02-22 DIAGNOSIS — I351 Nonrheumatic aortic (valve) insufficiency: Secondary | ICD-10-CM | POA: Diagnosis present

## 2014-02-22 DIAGNOSIS — F17210 Nicotine dependence, cigarettes, uncomplicated: Secondary | ICD-10-CM | POA: Diagnosis present

## 2014-02-22 HISTORY — DX: Headache: R51

## 2014-02-22 HISTORY — DX: Anxiety disorder, unspecified: F41.9

## 2014-02-22 HISTORY — DX: Depression, unspecified: F32.A

## 2014-02-22 HISTORY — DX: Headache, unspecified: R51.9

## 2014-02-22 HISTORY — DX: Major depressive disorder, single episode, unspecified: F32.9

## 2014-02-22 LAB — COMPREHENSIVE METABOLIC PANEL
ALBUMIN: 3 g/dL — AB (ref 3.5–5.2)
ALT: 8 U/L (ref 0–35)
AST: 12 U/L (ref 0–37)
Alkaline Phosphatase: 52 U/L (ref 39–117)
Anion gap: 15 (ref 5–15)
BUN: 18 mg/dL (ref 6–23)
CALCIUM: 9.5 mg/dL (ref 8.4–10.5)
CO2: 23 meq/L (ref 19–32)
Chloride: 98 mEq/L (ref 96–112)
Creatinine, Ser: 1 mg/dL (ref 0.50–1.10)
GFR calc Af Amer: 65 mL/min — ABNORMAL LOW (ref >90)
GFR, EST NON AFRICAN AMERICAN: 56 mL/min — AB (ref >90)
Glucose, Bld: 117 mg/dL — ABNORMAL HIGH (ref 70–99)
Potassium: 3.6 mEq/L — ABNORMAL LOW (ref 3.7–5.3)
SODIUM: 136 meq/L — AB (ref 137–147)
TOTAL PROTEIN: 7.2 g/dL (ref 6.0–8.3)
Total Bilirubin: 0.2 mg/dL — ABNORMAL LOW (ref 0.3–1.2)

## 2014-02-22 LAB — CBC
HCT: 31.2 % — ABNORMAL LOW (ref 36.0–46.0)
Hemoglobin: 10.3 g/dL — ABNORMAL LOW (ref 12.0–15.0)
MCH: 28 pg (ref 26.0–34.0)
MCHC: 33 g/dL (ref 30.0–36.0)
MCV: 84.8 fL (ref 78.0–100.0)
PLATELETS: 411 10*3/uL — AB (ref 150–400)
RBC: 3.68 MIL/uL — AB (ref 3.87–5.11)
RDW: 14.9 % (ref 11.5–15.5)
WBC: 11.7 10*3/uL — ABNORMAL HIGH (ref 4.0–10.5)

## 2014-02-22 LAB — I-STAT TROPONIN, ED: TROPONIN I, POC: 0.11 ng/mL — AB (ref 0.00–0.08)

## 2014-02-22 LAB — PROTIME-INR
INR: 1.13 (ref 0.00–1.49)
PROTHROMBIN TIME: 14.6 s (ref 11.6–15.2)

## 2014-02-22 MED ORDER — GI COCKTAIL ~~LOC~~
30.0000 mL | Freq: Once | ORAL | Status: AC
  Administered 2014-02-23: 30 mL via ORAL
  Filled 2014-02-22: qty 30

## 2014-02-22 MED ORDER — SODIUM CHLORIDE 0.9 % IV SOLN
1000.0000 mL | INTRAVENOUS | Status: DC
  Administered 2014-02-22: 1000 mL via INTRAVENOUS

## 2014-02-22 MED ORDER — NITROGLYCERIN 0.4 MG SL SUBL
0.4000 mg | SUBLINGUAL_TABLET | SUBLINGUAL | Status: DC | PRN
  Administered 2014-02-22: 0.4 mg via SUBLINGUAL
  Filled 2014-02-22: qty 1

## 2014-02-22 NOTE — Telephone Encounter (Signed)
Patient will be coming by today to pick up a stool sample kit

## 2014-02-22 NOTE — ED Provider Notes (Addendum)
CSN: [id number]     Arrival date & time 02/22/14  2214 History   This chart was scribed for Bonnie Fuel, MD by Lowella Petties, ED Scribe. The patient was seen in room APA18/APA18. Patient's care was started at 11:09 PM.   Chief Complaint  Patient presents with  . Chest Pain   The history is provided by the patient. No language interpreter was used.   HPI Comments: Bonnie Powers is a 68 y.o. female with a history of ulcerative colitis and HTN who presents to the Emergency Department complaining of constant, burning chest pain which began 2 hours ago. She additionally reports pain in her jaw which began 1.5 hours ago. She rates her pain as an 8/10. She denies any modifying factors making the pain better or worse. She denies taking any medications at home for her pain. She took nitroglycerine here in the ED with minimal relief. She reports similar symptoms in July that resolved quickly. She denies diaphoresis, fever, nausea, or vomiting. She states that aspirin irritates her colitis. She denies having a PCP. She sees a Copy, Dr. [name]. She smokes 1 PPD.  Past Medical History  Diagnosis Date  . Ulcerative colitis 2001    Last colonoscopy Dr [name] 12/28/09-> pan-UC, normal TI, maintained on Asacol and Imuran  . HTN (hypertension)   . OA (osteoarthritis)   . Hypercholesteremia   . Hepatitis 1972    ? unknown type  . Otitis media, chronic   . Basal cell cancer     left temple   Past Surgical History  Procedure Laterality Date  . Salpingoophorectomy      right ovary, both tubes  . Tonsillectomy    . Eye surgery    . Colonoscopy  12/28/09    Dr. [name] colitis   Family History  Problem Relation Age of Onset  . Skin cancer Father   . Heart failure Mother   . HIV Brother   . Lung cancer Brother    History  Substance Use Topics  . Smoking status: Current Every Day Smoker -- 1.00 packs/day for 30 years    Types: Cigarettes  . Smokeless tobacco: Not on file      Comment: trying  . Alcohol Use: No   OB History    No data available     Review of Systems  Cardiovascular: Positive for chest pain.  All other systems reviewed and are negative.   Allergies  Aspirin  Home Medications   Prior to Admission medications   Medication Sig Start Date End Date Taking? Authorizing Provider  acetaminophen (TYLENOL) 500 MG tablet Take 500-1,000 mg by mouth daily.   Yes Historical Provider, MD  azaTHIOprine (IMURAN) 50 MG tablet TAKE 3 TABLETS BY MOUTH EVERY DAY Patient taking differently: Take 150 mg by mouth daily.  03/10/13  Yes Mahala Menghini, PA-C  Calcium Citrate-Vitamin D (CALCITRATE PLUS D PO) Take 1 tablet by mouth daily. Calcium Citrate 630/Vitamin d 500   Yes Historical Provider, MD  cholecalciferol (VITAMIN D) 1000 UNITS tablet Take 1,000 Units by mouth daily.   Yes Historical Provider, MD  enalapril (VASOTEC) 20 MG tablet Take 20 mg by mouth daily.   Yes Historical Provider, MD  ferrous sulfate 325 (65 FE) MG tablet Take 325 mg by mouth 2 (two) times daily with a meal.   Yes Historical Provider, MD  Mesalamine (ASACOL HD) 800 MG TBEC Take 800 mg by mouth 3 (three) times daily.   Yes Historical Provider, MD  Multiple Vitamin (MULTIVITAMIN WITH MINERALS) TABS tablet Take 1 tablet by mouth daily.   Yes Historical Provider, MD  Probiotic Product (PROBIOTIC FORMULA PO) Take 1 tablet by mouth daily.   Yes Historical Provider, MD  amoxicillin-clavulanate (AUGMENTIN) 875-125 MG per tablet Take 1 tablet by mouth 2 (two) times daily. Patient not taking: Reported on 02/22/2014 12/14/12   Mahala Menghini, PA-C  dicyclomine (BENTYL) 10 MG capsule Take 1 capsule (10 mg total) by mouth 4 (four) times daily as needed. For diarrhea Patient not taking: Reported on 02/22/2014 09/30/12   Mahala Menghini, PA-C  Mesalamine (ASACOL) 400 MG CPDR DR capsule Take 2 capsules (800 mg total) by mouth 3 (three) times daily. Patient not taking: Reported on 02/22/2014 06/07/13   Mahala Menghini, PA-C   Triage Vitals: BP 127/56 mmHg  Pulse 95  Temp(Src) 98.1 F (36.7 C) (Oral)  Resp 20  Ht 5' 6"  (1.676 m)  Wt 204 lb (92.534 kg)  BMI 32.94 kg/m2  SpO2 96% Physical Exam  Constitutional: She is oriented to person, place, and time. She appears well-developed and well-nourished. No distress.  HENT:  Head: Normocephalic and atraumatic.  Mouth/Throat: Oropharynx is clear and moist.  Eyes: Conjunctivae and EOM are normal. Pupils are equal, round, and reactive to light.  Neck: Normal range of motion. Neck supple. No JVD present. No tracheal deviation present.  Cardiovascular: Normal rate, regular rhythm and normal heart sounds.   No murmur heard. Pulmonary/Chest: Effort normal and breath sounds normal. No respiratory distress. She has no wheezes. She has no rales.  Abdominal: Soft. Bowel sounds are normal. She exhibits no distension and no mass. There is no tenderness.  Musculoskeletal: Normal range of motion. She exhibits no edema.  Lymphadenopathy:    She has no cervical adenopathy.  Neurological: She is alert and oriented to person, place, and time. She has normal reflexes. No cranial nerve deficit. Coordination normal.  Skin: Skin is warm and dry. No rash noted.  Psychiatric: She has a normal mood and affect. Her behavior is normal. Thought content normal.  Nursing note and vitals reviewed.   ED Course  Procedures (including critical care time) DIAGNOSTIC STUDIES: Oxygen Saturation is 96% on room air, normal by my interpretation.    COORDINATION OF CARE: 11:15 PM-Discussed treatment plan which includes CXR, EKG, and lab work with pt at bedside and pt agreed to plan.   Labs Review Results for orders placed or performed during the hospital encounter of 02/22/14  CBC  Result Value Ref Range   WBC 11.7 (H) 4.0 - 10.5 K/uL   RBC 3.68 (L) 3.87 - 5.11 MIL/uL   Hemoglobin 10.3 (L) 12.0 - 15.0 g/dL   HCT 31.2 (L) 36.0 - 46.0 %   MCV 84.8 78.0 - 100.0 fL   MCH 28.0  26.0 - 34.0 pg   MCHC 33.0 30.0 - 36.0 g/dL   RDW 14.9 11.5 - 15.5 %   Platelets 411 (H) 150 - 400 K/uL  Comprehensive metabolic panel  Result Value Ref Range   Sodium 136 (L) 137 - 147 mEq/L   Potassium 3.6 (L) 3.7 - 5.3 mEq/L   Chloride 98 96 - 112 mEq/L   CO2 23 19 - 32 mEq/L   Glucose, Bld 117 (H) 70 - 99 mg/dL   BUN 18 6 - 23 mg/dL   Creatinine, Ser 1.00 0.50 - 1.10 mg/dL   Calcium 9.5 8.4 - 10.5 mg/dL   Total Protein 7.2 6.0 - 8.3 g/dL  Albumin 3.0 (L) 3.5 - 5.2 g/dL   AST 12 0 - 37 U/L   ALT 8 0 - 35 U/L   Alkaline Phosphatase 52 39 - 117 U/L   Total Bilirubin 0.2 (L) 0.3 - 1.2 mg/dL   GFR calc non Af Amer 56 (L) >90 mL/min   GFR calc Af Amer 65 (L) >90 mL/min   Anion gap 15 5 - 15  Protime-INR  Result Value Ref Range   Prothrombin Time 14.6 11.6 - 15.2 seconds   INR 1.13 0.00 - 1.49  Troponin I  Result Value Ref Range   Troponin I <0.30 <0.30 ng/mL  I-stat troponin, ED (not at [facility])  Result Value Ref Range   Troponin i, poc 0.11 (HH) 0.00 - 0.08 ng/mL   Comment 3           Imaging Review Dg Chest 2 View  02/22/2014   CLINICAL DATA:  Chest pain and jaw pain  EXAM: CHEST  2 VIEW  COMPARISON:  09/06/2013  FINDINGS: Cardiac shadow is at the upper limits of normal in size. The lungs are well aerated bilaterally. Aortic calcifications are again seen. No acute bony abnormality is noted. Chronic mid thoracic compression deformity is noted.  IMPRESSION: No acute abnormality noted.   Electronically Signed   By: Inez Catalina M.D.   On: 02/22/2014 23:55     EKG Interpretation   Date/Time:  Tuesday February 22 2014 22:21:37 EST Ventricular Rate:  99 PR Interval:  206 QRS Duration: 91 QT Interval:  354 QTC Calculation: 454 R Axis:   25 Text Interpretation:  Sinus rhythm Minimal ST depression, anterolateral  leads No significant change since last tracing Confirmed by KNAPP  MD-J,  JON ([id number]) on 02/22/2014 10:32:35 PM      CRITICAL CARE Performed by:  [email or web address],[email or web address] Total critical care time: 45 minutes Critical care time was exclusive of separately billable procedures and treating other patients. Critical care was necessary to treat or prevent imminent or life-threatening deterioration. Critical care was time spent personally by me on the following activities: development of treatment plan with patient and/or surrogate as well as nursing, discussions with consultants, evaluation of patient's response to treatment, examination of patient, obtaining history from patient or surrogate, ordering and performing treatments and interventions, ordering and review of laboratory studies, ordering and review of radiographic studies, pulse oximetry and re-evaluation of patient's condition. MDM   Final diagnoses:  Acute coronary syndrome  Normochromic normocytic anemia  Hypokalemia   Chest pain which is somewhat atypical but in patient with significant risk factors for heart disease. Aspirin is not given because of allergy. She is given a trial of nitroglycerin.  There was partial relief of pain with nitroglycerin. Point-of-care troponin has come back elevated so patient is started on heparin to treat acute coronary syndrome. Main lab troponin has come back normal. At this point, I am still concerned about acute coronary syndrome. She is continued to be pain-free. Case is discussed with Dr. Oval Linsey of cardiology service who agrees to accept the patient transferred to [facility].  I personally performed the services described in this documentation, which was scribed in my presence. The recorded information has been reviewed and is accurate.     Bonnie Fuel, MD 11/94/17 4081  Bonnie Fuel, MD 44/81/85 [id number]

## 2014-02-22 NOTE — Telephone Encounter (Signed)
Noted  

## 2014-02-22 NOTE — ED Notes (Addendum)
Chest pain and pain in jaw for 1.5 hours.  Cough, clear sputum, says fever 101 at home pta.  Did not take any med to treat.    Diarrhea , feels ulcerative colitis may be affecting her.

## 2014-02-23 ENCOUNTER — Ambulatory Visit (HOSPITAL_COMMUNITY): Admit: 2014-02-23 | Payer: Self-pay | Admitting: Cardiovascular Disease

## 2014-02-23 ENCOUNTER — Other Ambulatory Visit: Payer: Self-pay | Admitting: *Deleted

## 2014-02-23 ENCOUNTER — Encounter (HOSPITAL_COMMUNITY)
Admission: EM | Disposition: A | Discharge status: Home / Self Care | Payer: Self-pay | Source: Home / Self Care | Attending: Cardiothoracic Surgery

## 2014-02-23 ENCOUNTER — Encounter (HOSPITAL_COMMUNITY): Payer: Self-pay | Admitting: General Practice

## 2014-02-23 DIAGNOSIS — I214 Non-ST elevation (NSTEMI) myocardial infarction: Secondary | ICD-10-CM | POA: Diagnosis present

## 2014-02-23 DIAGNOSIS — F419 Anxiety disorder, unspecified: Secondary | ICD-10-CM | POA: Diagnosis present

## 2014-02-23 DIAGNOSIS — J449 Chronic obstructive pulmonary disease, unspecified: Secondary | ICD-10-CM | POA: Diagnosis present

## 2014-02-23 DIAGNOSIS — I10 Essential (primary) hypertension: Secondary | ICD-10-CM

## 2014-02-23 DIAGNOSIS — D62 Acute posthemorrhagic anemia: Secondary | ICD-10-CM | POA: Diagnosis not present

## 2014-02-23 DIAGNOSIS — Z8249 Family history of ischemic heart disease and other diseases of the circulatory system: Secondary | ICD-10-CM | POA: Diagnosis not present

## 2014-02-23 DIAGNOSIS — R079 Chest pain, unspecified: Secondary | ICD-10-CM | POA: Diagnosis present

## 2014-02-23 DIAGNOSIS — Z01810 Encounter for preprocedural cardiovascular examination: Secondary | ICD-10-CM | POA: Diagnosis not present

## 2014-02-23 DIAGNOSIS — K51918 Ulcerative colitis, unspecified with other complication: Secondary | ICD-10-CM

## 2014-02-23 DIAGNOSIS — E785 Hyperlipidemia, unspecified: Secondary | ICD-10-CM

## 2014-02-23 DIAGNOSIS — I2510 Atherosclerotic heart disease of native coronary artery without angina pectoris: Secondary | ICD-10-CM

## 2014-02-23 DIAGNOSIS — I351 Nonrheumatic aortic (valve) insufficiency: Secondary | ICD-10-CM | POA: Diagnosis present

## 2014-02-23 DIAGNOSIS — E669 Obesity, unspecified: Secondary | ICD-10-CM | POA: Diagnosis present

## 2014-02-23 DIAGNOSIS — F329 Major depressive disorder, single episode, unspecified: Secondary | ICD-10-CM | POA: Diagnosis present

## 2014-02-23 DIAGNOSIS — R197 Diarrhea, unspecified: Secondary | ICD-10-CM

## 2014-02-23 DIAGNOSIS — Z79899 Other long term (current) drug therapy: Secondary | ICD-10-CM | POA: Diagnosis not present

## 2014-02-23 DIAGNOSIS — F17210 Nicotine dependence, cigarettes, uncomplicated: Secondary | ICD-10-CM | POA: Diagnosis present

## 2014-02-23 DIAGNOSIS — I25110 Atherosclerotic heart disease of native coronary artery with unstable angina pectoris: Secondary | ICD-10-CM | POA: Diagnosis present

## 2014-02-23 DIAGNOSIS — E119 Type 2 diabetes mellitus without complications: Secondary | ICD-10-CM | POA: Diagnosis present

## 2014-02-23 DIAGNOSIS — E780 Pure hypercholesterolemia: Secondary | ICD-10-CM | POA: Diagnosis present

## 2014-02-23 DIAGNOSIS — Z886 Allergy status to analgesic agent status: Secondary | ICD-10-CM | POA: Diagnosis not present

## 2014-02-23 DIAGNOSIS — K759 Inflammatory liver disease, unspecified: Secondary | ICD-10-CM | POA: Diagnosis present

## 2014-02-23 DIAGNOSIS — M1990 Unspecified osteoarthritis, unspecified site: Secondary | ICD-10-CM | POA: Diagnosis present

## 2014-02-23 DIAGNOSIS — E8770 Fluid overload, unspecified: Secondary | ICD-10-CM | POA: Diagnosis present

## 2014-02-23 DIAGNOSIS — K5190 Ulcerative colitis, unspecified, without complications: Secondary | ICD-10-CM | POA: Diagnosis present

## 2014-02-23 DIAGNOSIS — Z720 Tobacco use: Secondary | ICD-10-CM

## 2014-02-23 DIAGNOSIS — Z6833 Body mass index (BMI) 33.0-33.9, adult: Secondary | ICD-10-CM | POA: Diagnosis not present

## 2014-02-23 DIAGNOSIS — I4891 Unspecified atrial fibrillation: Secondary | ICD-10-CM | POA: Diagnosis not present

## 2014-02-23 HISTORY — PX: LEFT HEART CATHETERIZATION WITH CORONARY ANGIOGRAM: SHX5451

## 2014-02-23 LAB — BASIC METABOLIC PANEL
ANION GAP: 12 (ref 5–15)
BUN: 13 mg/dL (ref 6–23)
CALCIUM: 9.2 mg/dL (ref 8.4–10.5)
CO2: 23 mEq/L (ref 19–32)
Chloride: 105 mEq/L (ref 96–112)
Creatinine, Ser: 0.84 mg/dL (ref 0.50–1.10)
GFR, EST AFRICAN AMERICAN: 80 mL/min — AB (ref >90)
GFR, EST NON AFRICAN AMERICAN: 69 mL/min — AB (ref >90)
Glucose, Bld: 106 mg/dL — ABNORMAL HIGH (ref 70–99)
Potassium: 4.5 mEq/L (ref 3.7–5.3)
SODIUM: 140 meq/L (ref 137–147)

## 2014-02-23 LAB — BLOOD GAS, ARTERIAL
Acid-base deficit: 2.1 mmol/L — ABNORMAL HIGH (ref 0.0–2.0)
Bicarbonate: 22.4 mEq/L (ref 20.0–24.0)
Drawn by: 36527
FIO2: 0.21 %
O2 Saturation: 90.6 %
Patient temperature: 98.6
TCO2: 23.7 mmol/L (ref 0–100)
pCO2 arterial: 40 mmHg (ref 35.0–45.0)
pH, Arterial: 7.367 (ref 7.350–7.450)
pO2, Arterial: 63.3 mmHg — ABNORMAL LOW (ref 80.0–100.0)

## 2014-02-23 LAB — HEPARIN LEVEL (UNFRACTIONATED)
Heparin Unfractionated: 0.17 IU/mL — ABNORMAL LOW (ref 0.30–0.70)
Heparin Unfractionated: 0.24 IU/mL — ABNORMAL LOW (ref 0.30–0.70)

## 2014-02-23 LAB — LIPID PANEL
CHOL/HDL RATIO: 4.3 ratio
Cholesterol: 171 mg/dL (ref 0–200)
HDL: 40 mg/dL (ref >39)
LDL Cholesterol: 112 mg/dL — ABNORMAL HIGH (ref 0–99)
Triglycerides: 94 mg/dL (ref <150)
VLDL: 19 mg/dL (ref 0–40)

## 2014-02-23 LAB — URINALYSIS, ROUTINE W REFLEX MICROSCOPIC
Bilirubin Urine: NEGATIVE
Glucose, UA: NEGATIVE mg/dL
Hgb urine dipstick: NEGATIVE
Ketones, ur: NEGATIVE mg/dL
Leukocytes, UA: NEGATIVE
Nitrite: NEGATIVE
Protein, ur: NEGATIVE mg/dL
Specific Gravity, Urine: 1.015 (ref 1.005–1.030)
Urobilinogen, UA: 0.2 mg/dL (ref 0.0–1.0)
pH: 5.5 (ref 5.0–8.0)

## 2014-02-23 LAB — SURGICAL PCR SCREEN
MRSA, PCR: NEGATIVE
Staphylococcus aureus: NEGATIVE

## 2014-02-23 LAB — SEDIMENTATION RATE: Sed Rate: 85 mm/hr — ABNORMAL HIGH (ref 0–22)

## 2014-02-23 LAB — CBC
HCT: 32.1 % — ABNORMAL LOW (ref 36.0–46.0)
HEMOGLOBIN: 10.2 g/dL — AB (ref 12.0–15.0)
MCH: 27.5 pg (ref 26.0–34.0)
MCHC: 31.8 g/dL (ref 30.0–36.0)
MCV: 86.5 fL (ref 78.0–100.0)
Platelets: 362 10*3/uL (ref 150–400)
RBC: 3.71 MIL/uL — ABNORMAL LOW (ref 3.87–5.11)
RDW: 15 % (ref 11.5–15.5)
WBC: 10.6 10*3/uL — ABNORMAL HIGH (ref 4.0–10.5)

## 2014-02-23 LAB — TROPONIN I
TROPONIN I: 2.36 ng/mL — AB (ref <0.30)
TROPONIN I: 3.48 ng/mL — AB (ref <0.30)
Troponin I: <0.3 ng/mL (ref <0.30)

## 2014-02-23 LAB — C-REACTIVE PROTEIN: CRP: 8.7 mg/dL — ABNORMAL HIGH (ref <0.60)

## 2014-02-23 LAB — PROTIME-INR
INR: 1.15 (ref 0.00–1.49)
PROTHROMBIN TIME: 14.8 s (ref 11.6–15.2)

## 2014-02-23 SURGERY — LEFT HEART CATHETERIZATION WITH CORONARY ANGIOGRAM
Anesthesia: LOCAL

## 2014-02-23 MED ORDER — NITROGLYCERIN 1 MG/10 ML FOR IR/CATH LAB
INTRA_ARTERIAL | Status: AC
  Filled 2014-02-23: qty 10

## 2014-02-23 MED ORDER — ONDANSETRON HCL 4 MG/2ML IJ SOLN: 4.0000 mg | Freq: Three times a day (TID) | INTRAMUSCULAR | Status: DC | PRN

## 2014-02-23 MED ORDER — ASPIRIN 81 MG PO CHEW: 81.0000 mg | CHEWABLE_TABLET | ORAL | Status: DC

## 2014-02-23 MED ORDER — ONDANSETRON HCL 4 MG/2ML IJ SOLN: 4.0000 mg | Freq: Four times a day (QID) | INTRAMUSCULAR | Status: DC | PRN

## 2014-02-23 MED ORDER — SODIUM CHLORIDE 0.9 % IJ SOLN: 3.0000 mL | INTRAMUSCULAR | Status: DC | PRN

## 2014-02-23 MED ORDER — MESALAMINE 400 MG PO CPDR
800.0000 mg | DELAYED_RELEASE_CAPSULE | Freq: Three times a day (TID) | ORAL | Status: DC
  Administered 2014-02-23 – 2014-02-26 (×8): 800 mg via ORAL
  Filled 2014-02-23 (×16): qty 2

## 2014-02-23 MED ORDER — AZATHIOPRINE 50 MG PO TABS
150.0000 mg | ORAL_TABLET | Freq: Every day | ORAL | Status: DC
  Administered 2014-02-24: 150 mg via ORAL
  Filled 2014-02-23 (×3): qty 3

## 2014-02-23 MED ORDER — MIDAZOLAM HCL 2 MG/2ML IJ SOLN
INTRAMUSCULAR | Status: AC
  Filled 2014-02-23: qty 2

## 2014-02-23 MED ORDER — ONDANSETRON HCL 4 MG/2ML IJ SOLN
4.0000 mg | Freq: Once | INTRAMUSCULAR | Status: AC
  Administered 2014-02-23: 4 mg via INTRAVENOUS
  Filled 2014-02-23: qty 2

## 2014-02-23 MED ORDER — HEPARIN (PORCINE) IN NACL 100-0.45 UNIT/ML-% IJ SOLN
1600.0000 [IU]/h | INTRAMUSCULAR | Status: DC
  Administered 2014-02-23: 1250 [IU]/h via INTRAVENOUS
  Administered 2014-02-24: 1600 [IU]/h via INTRAVENOUS
  Filled 2014-02-23 (×5): qty 250

## 2014-02-23 MED ORDER — ATORVASTATIN CALCIUM 40 MG PO TABS
40.0000 mg | ORAL_TABLET | Freq: Every day | ORAL | Status: DC
  Administered 2014-02-23 – 2014-03-01 (×6): 40 mg via ORAL
  Filled 2014-02-23 (×9): qty 1

## 2014-02-23 MED ORDER — BUDESONIDE-FORMOTEROL FUMARATE 160-4.5 MCG/ACT IN AERO
2.0000 | INHALATION_SPRAY | Freq: Two times a day (BID) | RESPIRATORY_TRACT | Status: DC
  Administered 2014-02-23 – 2014-02-24 (×2): 2 via RESPIRATORY_TRACT
  Filled 2014-02-23: qty 6

## 2014-02-23 MED ORDER — VERAPAMIL HCL 2.5 MG/ML IV SOLN
INTRAVENOUS | Status: AC
Start: 2014-02-23 — End: 2014-02-23
  Filled 2014-02-23: qty 2

## 2014-02-23 MED ORDER — SODIUM CHLORIDE 0.9 % IV SOLN
1.0000 mL/kg/h | Freq: Once | INTRAVENOUS | Status: AC
Start: 2014-02-23 — End: 2014-02-23
  Administered 2014-02-23: 1 mL/kg/h via INTRAVENOUS

## 2014-02-23 MED ORDER — HEPARIN (PORCINE) IN NACL 100-0.45 UNIT/ML-% IJ SOLN
1250.0000 [IU]/h | INTRAMUSCULAR | Status: DC
  Filled 2014-02-23: qty 250

## 2014-02-23 MED ORDER — SODIUM CHLORIDE 0.9 % IJ SOLN: 3.0000 mL | Freq: Two times a day (BID) | INTRAMUSCULAR | Status: DC

## 2014-02-23 MED ORDER — HEPARIN (PORCINE) IN NACL 100-0.45 UNIT/ML-% IJ SOLN
1000.0000 [IU]/h | INTRAMUSCULAR | Status: DC
  Administered 2014-02-23: 1000 [IU]/h via INTRAVENOUS
  Filled 2014-02-23: qty 250

## 2014-02-23 MED ORDER — FERROUS SULFATE 325 (65 FE) MG PO TABS
325.0000 mg | ORAL_TABLET | Freq: Two times a day (BID) | ORAL | Status: DC
  Administered 2014-02-24 – 2014-02-26 (×4): 325 mg via ORAL
  Filled 2014-02-23 (×11): qty 1

## 2014-02-23 MED ORDER — FENTANYL CITRATE 0.05 MG/ML IJ SOLN
INTRAMUSCULAR | Status: AC
  Filled 2014-02-23: qty 2

## 2014-02-23 MED ORDER — POTASSIUM CHLORIDE CRYS ER 20 MEQ PO TBCR
40.0000 meq | EXTENDED_RELEASE_TABLET | Freq: Once | ORAL | Status: AC
  Administered 2014-02-23: 40 meq via ORAL
  Filled 2014-02-23: qty 2

## 2014-02-23 MED ORDER — NICOTINE 21 MG/24HR TD PT24
21.0000 mg | MEDICATED_PATCH | Freq: Every day | TRANSDERMAL | Status: DC
  Administered 2014-02-24 – 2014-03-02 (×6): 21 mg via TRANSDERMAL
  Filled 2014-02-23 (×8): qty 1

## 2014-02-23 MED ORDER — SODIUM CHLORIDE 0.9 % IV SOLN: INTRAVENOUS | Status: AC

## 2014-02-23 MED ORDER — RISAQUAD PO CAPS
1.0000 | ORAL_CAPSULE | Freq: Every day | ORAL | Status: DC
  Administered 2014-02-23 – 2014-03-02 (×7): 1 via ORAL
  Filled 2014-02-23 (×8): qty 1

## 2014-02-23 MED ORDER — HEPARIN BOLUS VIA INFUSION
4000.0000 [IU] | Freq: Once | INTRAVENOUS | Status: AC
  Administered 2014-02-23: 4000 [IU] via INTRAVENOUS

## 2014-02-23 MED ORDER — HEPARIN SODIUM (PORCINE) 1000 UNIT/ML IJ SOLN
INTRAMUSCULAR | Status: AC
  Filled 2014-02-23: qty 1

## 2014-02-23 MED ORDER — INFLUENZA VAC SPLIT QUAD 0.5 ML IM SUSY
0.5000 mL | PREFILLED_SYRINGE | INTRAMUSCULAR | Status: DC
  Filled 2014-02-23: qty 0.5

## 2014-02-23 MED ORDER — ENALAPRIL MALEATE 20 MG PO TABS
20.0000 mg | ORAL_TABLET | Freq: Every day | ORAL | Status: DC
  Administered 2014-02-23 – 2014-02-24 (×2): 20 mg via ORAL
  Filled 2014-02-23 (×3): qty 1

## 2014-02-23 MED ORDER — LIDOCAINE HCL (PF) 1 % IJ SOLN
INTRAMUSCULAR | Status: AC
Start: 2014-02-23 — End: 2014-02-23
  Filled 2014-02-23: qty 30

## 2014-02-23 MED ORDER — HEPARIN (PORCINE) IN NACL 2-0.9 UNIT/ML-% IJ SOLN
INTRAMUSCULAR | Status: AC
  Filled 2014-02-23: qty 1000

## 2014-02-23 MED ORDER — HEPARIN BOLUS VIA INFUSION
2000.0000 [IU] | INTRAVENOUS | Status: DC
  Filled 2014-02-23: qty 2000

## 2014-02-23 MED ORDER — NITROGLYCERIN 0.4 MG SL SUBL: 0.4000 mg | SUBLINGUAL_TABLET | SUBLINGUAL | Status: DC | PRN

## 2014-02-23 MED ORDER — MORPHINE SULFATE 4 MG/ML IJ SOLN
4.0000 mg | Freq: Once | INTRAMUSCULAR | Status: AC
  Administered 2014-02-23: 4 mg via INTRAVENOUS
  Filled 2014-02-23: qty 1

## 2014-02-23 MED ORDER — SODIUM CHLORIDE 0.9 % IV SOLN: 250.0000 mL | INTRAVENOUS | Status: DC | PRN

## 2014-02-23 MED ORDER — ASPIRIN EC 81 MG PO TBEC
81.0000 mg | DELAYED_RELEASE_TABLET | Freq: Every day | ORAL | Status: DC
  Administered 2014-02-23 – 2014-02-24 (×2): 81 mg via ORAL
  Filled 2014-02-23 (×3): qty 1

## 2014-02-23 MED ORDER — METOPROLOL TARTRATE 12.5 MG HALF TABLET
12.5000 mg | ORAL_TABLET | Freq: Two times a day (BID) | ORAL | Status: DC
  Administered 2014-02-23 – 2014-02-24 (×4): 12.5 mg via ORAL
  Filled 2014-02-23 (×6): qty 1

## 2014-02-23 MED ORDER — ACETAMINOPHEN 325 MG PO TABS
650.0000 mg | ORAL_TABLET | ORAL | Status: DC | PRN
  Administered 2014-02-23: 650 mg via ORAL
  Filled 2014-02-23: qty 2

## 2014-02-23 NOTE — H&P (Signed)
HPI: [name] is a 81F with HTN, tobacco abuse and ulcerative colitis who presented to [facility] with chest pain.  The pain began at 9pm while feeding her dogs.  It was 9/10 sharp, burning pain that radiated to her neck and jaw.  She denies associated SOB, nausea or vomiting.  It did not improve with rest, so she decided to check her vital signs.  BP was 168/98 and her temperature was 101.  She was concerned that she may be having a heart attack so she presented to the ED.  In the ED here EKG did not reveal ischemic changes.  POC troponin was positive at 0.11 (ULN 0.08), though the laboratory troponin was less than assay.  She was started on a heparin drip and received both aspirin and morphine.  She has been CP free since that time.  [name] reports one prior episode of CP that occurred in July.  It was similar and lasted for one day.  She was unable to follow up on this due to insurance changes.  [name] reports a recent flare of her UC.  At baseline she has 2 bowel movements daily.  Over the last 6 weeks she reports 20-30 bowel movements daily.  There is no blood, but she has mucous.  Her last bowel movement was just before coming to the hospital (8 hours ago), so she thinks it is improving.  SHe also reports crampy lower abdominal pain.  Her gastroenterologist is Dr. [name].   Review of Systems:     Cardiac Review of Systems: {Y] = yes [ ] = no  Chest Pain [ x   ]  Resting SOB [   ] Exertional SOB  [  ]  Orthopnea [  ]   Pedal Edema [x-chronically in R LE   ]    Palpitations [ x ] Syncope  [  ]   Presyncope [   ]  General Review of Systems: [Y] = yes [  ]=no Constitional: recent weight change [  ]; anorexia [  ]; fatigue [  ]; nausea [  ]; night sweats [  ]; fever [  ]; or chills [  ];                                                                                                                                          Dental: poor dentition[  ]  Eye : blurred vision [  ]; diplopia [    ]; vision changes [  ];  Amaurosis fugax[  ]; Resp: cough [  ];  wheezing[  ];  hemoptysis[  ]; shortness of breath[  ]; paroxysmal nocturnal dyspnea[  ]; dyspnea on exertion[  ]; or orthopnea[  ];  GI:  gallstones[  ], vomiting[  ];  dysphagia[  ]; melena[  ];  hematochezia [  ]; heartburn[  ];  Hx of  Colonoscopy[  ]; GU: kidney stones [  ]; hematuria[  ];   dysuria [  ];  nocturia[  ];  history of     obstruction [  ];                 Skin: rash, swelling[  ];, hair loss[  ];  peripheral edema[  ];  or itching[  ]; Musculosketetal: myalgias[  ];  joint swelling[  ];  joint erythema[  ];  joint pain[  ];  back pain[  ];  Heme/Lymph: bruising[  ];  bleeding[  ];  anemia[  ];  Neuro: TIA[  ];  headaches[  ];  stroke[  ];  vertigo[  ];  seizures[  ];   paresthesias[  ];  difficulty walking[  ];  Psych:depression[  ]; anxiety[  ];  Endocrine: diabetes[  ];  thyroid dysfunction[  ];  Immunizations: Flu [  ]; Pneumococcal[  ];  Other:  Past Medical History  Diagnosis Date  . Ulcerative colitis 2001    Last colonoscopy Dr Rourk 12/28/09-> pan-UC, normal TI, maintained on Asacol and Imuran  . HTN (hypertension)   . OA (osteoarthritis)   . Hypercholesteremia   . Hepatitis 1972    ? unknown type  . Otitis media, chronic   . Basal cell cancer     left temple    Medications Prior to Admission  Medication Sig Dispense Refill  . acetaminophen (TYLENOL) 500 MG tablet Take 500-1,000 mg by mouth daily.    . azaTHIOprine (IMURAN) 50 MG tablet TAKE 3 TABLETS BY MOUTH EVERY DAY (Patient taking differently: Take 150 mg by mouth daily. ) 270 tablet 3  . Calcium Citrate-Vitamin D (CALCITRATE PLUS D PO) Take 1 tablet by mouth daily. Calcium Citrate 630/Vitamin d 500    . cholecalciferol (VITAMIN D) 1000 UNITS tablet Take 1,000 Units by mouth daily.    . enalapril (VASOTEC) 20 MG tablet Take 20 mg by mouth daily.    . ferrous sulfate 325 (65 FE) MG tablet Take 325 mg by mouth 2 (two) times daily with a  meal.    . Mesalamine (ASACOL HD) 800 MG TBEC Take 800 mg by mouth 3 (three) times daily.    . Multiple Vitamin (MULTIVITAMIN WITH MINERALS) TABS tablet Take 1 tablet by mouth daily.    . Probiotic Product (PROBIOTIC FORMULA PO) Take 1 tablet by mouth daily.    . amoxicillin-clavulanate (AUGMENTIN) 875-125 MG per tablet Take 1 tablet by mouth 2 (two) times daily. (Patient not taking: Reported on 02/22/2014) 14 tablet 0  . dicyclomine (BENTYL) 10 MG capsule Take 1 capsule (10 mg total) by mouth 4 (four) times daily as needed. For diarrhea (Patient not taking: Reported on 02/22/2014) 90 capsule 3  . Mesalamine (ASACOL) 400 MG CPDR DR capsule Take 2 capsules (800 mg total) by mouth 3 (three) times daily. (Patient not taking: Reported on 02/22/2014) 180 capsule 11     Allergies  Allergen Reactions  . Aspirin     History   Social History  . Marital Status: Divorced    Spouse Name: N/A    Number of Children: 3  . Years of Education: N/A   Occupational History  . retired     [employer]   Social History Main Topics  . Smoking status: Current Every Day Smoker -- 1.00 packs/day for 30 years    Types: Cigarettes  . Smokeless tobacco: Not on file       Comment: trying  . Alcohol Use: No  . Drug Use: No  . Sexual Activity: No   Other Topics Concern  . Not on file   Social History Narrative   Lost 1 daughter w/ meningitis    Family History  Problem Relation Age of Onset  . Skin cancer Father   . Heart failure Mother   . HIV Brother   . Lung cancer Brother     PHYSICAL EXAM: Filed Vitals:   02/23/14 0344  BP: 120/60  Pulse: 88  Temp: 98.9 F (37.2 C)  Resp: 18   General:  Well appearing. No respiratory difficulty HEENT: normal Neck: supple. no JVD. Carotids 2+ bilat; no bruits. No lymphadenopathy or thryomegaly appreciated. Cor: PMI nondisplaced. Regular rate & rhythm. No rubs, gallops or murmurs. Lungs: clear Abdomen: soft, mildly tender in bilateral lower  quadrants. nondistended. No hepatosplenomegaly. No bruits or masses.  Hyperactive bowel sounds. Extremities: no cyanosis, clubbing, rash, edema Neuro: alert & oriented x 3, cranial nerves grossly intact. moves all 4 extremities w/o difficulty. Affect pleasant.  ECG: Sinus rhythm at 99bpm  Results for orders placed or performed during the hospital encounter of 02/22/14 (from the past 24 hour(s))  CBC     Status: Abnormal   Collection Time: 02/22/14 10:40 PM  Result Value Ref Range   WBC 11.7 (H) 4.0 - 10.5 K/uL   RBC 3.68 (L) 3.87 - 5.11 MIL/uL   Hemoglobin 10.3 (L) 12.0 - 15.0 g/dL   HCT 31.2 (L) 36.0 - 46.0 %   MCV 84.8 78.0 - 100.0 fL   MCH 28.0 26.0 - 34.0 pg   MCHC 33.0 30.0 - 36.0 g/dL   RDW 14.9 11.5 - 15.5 %   Platelets 411 (H) 150 - 400 K/uL  Comprehensive metabolic panel     Status: Abnormal   Collection Time: 02/22/14 10:40 PM  Result Value Ref Range   Sodium 136 (L) 137 - 147 mEq/L   Potassium 3.6 (L) 3.7 - 5.3 mEq/L   Chloride 98 96 - 112 mEq/L   CO2 23 19 - 32 mEq/L   Glucose, Bld 117 (H) 70 - 99 mg/dL   BUN 18 6 - 23 mg/dL   Creatinine, Ser 1.00 0.50 - 1.10 mg/dL   Calcium 9.5 8.4 - 10.5 mg/dL   Total Protein 7.2 6.0 - 8.3 g/dL   Albumin 3.0 (L) 3.5 - 5.2 g/dL   AST 12 0 - 37 U/L   ALT 8 0 - 35 U/L   Alkaline Phosphatase 52 39 - 117 U/L   Total Bilirubin 0.2 (L) 0.3 - 1.2 mg/dL   GFR calc non Af Amer 56 (L) >90 mL/min   GFR calc Af Amer 65 (L) >90 mL/min   Anion gap 15 5 - 15  Protime-INR     Status: None   Collection Time: 02/22/14 10:40 PM  Result Value Ref Range   Prothrombin Time 14.6 11.6 - 15.2 seconds   INR 1.13 0.00 - 1.49  Troponin I     Status: None   Collection Time: 02/22/14 11:00 PM  Result Value Ref Range   Troponin I <0.30 <0.30 ng/mL  I-stat troponin, ED (not at [facility])     Status: Abnormal   Collection Time: 02/22/14 11:10 PM  Result Value Ref Range   Troponin i, poc 0.11 (HH) 0.00 - 0.08 ng/mL   Comment 3           Dg Chest 2  View  02/22/2014   CLINICAL   DATA:  Chest pain and jaw pain  EXAM: CHEST  2 VIEW  COMPARISON:  09/06/2013  FINDINGS: Cardiac shadow is at the upper limits of normal in size. The lungs are well aerated bilaterally. Aortic calcifications are again seen. No acute bony abnormality is noted. Chronic mid thoracic compression deformity is noted.  IMPRESSION: No acute abnormality noted.   Electronically Signed   By: Inez Catalina M.D.   On: 02/22/2014 23:55     ASSESSMENT:  24F with HTN, tobacco abuse and ulcerative colitis who presented to [facility] with chest pain.    PLAN/DISCUSSION: # Chest pain: [name] CP is atypical in that it is neither exertional nor alleviated with rest.  However, her story is quite concerning given her HTN and smoking history.  Also, the POC troponin taken at the same time as the regular troponin was slightly elevated.  It is hare to know how to interpret this, as the regular troponin was less than assay.  Patient is currently CP free. - Repeat cardiac enzymes.  If positive, will pursue LHC.  If negative, will stress. - Continue heparin gtt - ASA 49m daily - Start metoprolol [id number] and atorvastatin 4073m- Continue home enalapril 16m43mily - check lipids  # Tobacco abuse: [name] I discussed her tobacco abuse.  She would like assistance to quit smoking.   - Nicotine patch 21mc63mSmoking cessation counseling  # Ulcerative colitis: Patient's UC is flaring, thought it seems to be nearing the end of the flare, as she has not used the bathroom in 8 hours.  Prior to today she was going up to 30 times daily.  She had a fever at home (afebrile here and at OSH) and WBC is slightly elevated.  Physical exam is reassuring.  Abdomen is soft and very minimally tender. - Check ESR, CRP  - Continue home azathioprine and mesalamine - WIll need to consult GI vs medicine in the am for management of UC flare  # Code: Full

## 2014-02-23 NOTE — Progress Notes (Signed)
Utilization review completed. Miley Lindon, RN, BSN. 

## 2014-02-23 NOTE — Plan of Care (Signed)
Problem: Phase I Progression Outcomes Goal: Hemodynamically stable Outcome: Completed/Met Date Met:  02/23/14 Goal: Anginal pain relieved Outcome: Completed/Met Date Met:  02/23/14 Goal: MD aware of Cardiac Marker results Outcome: Completed/Met Date Met:  02/23/14 Goal: Voiding-avoid urinary catheter unless indicated Outcome: Completed/Met Date Met:  02/23/14 Goal: Other Phase I Outcomes/Goals Outcome: Completed/Met Date Met:  02/23/14

## 2014-02-23 NOTE — Interval H&P Note (Signed)
History and Physical Interval Note:  02/23/2014 10:54 AM  Bonnie Powers  has presented today for cardiac cath with the diagnosis of cp/nstemi.  The various methods of treatment have been discussed with the patient and family. After consideration of risks, benefits and other options for treatment, the patient has consented to  Procedure(s): LEFT HEART CATHETERIZATION WITH CORONARY ANGIOGRAM (N/A) as a surgical intervention .  The patient's history has been reviewed, patient examined, no change in status, stable for surgery.  I have reviewed the patient's chart and labs.  Questions were answered to the patient's satisfaction.    Cath Lab Visit (complete for each Cath Lab visit)  Clinical Evaluation Leading to the Procedure:   ACS: Yes.    Non-ACS:    Anginal Classification: CCS IV  Anti-ischemic medical therapy: No Therapy  Non-Invasive Test Results: No non-invasive testing performed  Prior CABG: No previous CABG         [name]

## 2014-02-23 NOTE — Progress Notes (Signed)
Attempted to get ABG pt wanted to use restroom first. Will try again later.

## 2014-02-23 NOTE — Progress Notes (Signed)
ANTICOAGULATION CONSULT NOTE - Follow up Pharmacy Consult for Heparin Indication: chest pain/ACS  Allergies  Allergen Reactions  . Aspirin    Patient Measurements: Height: 5' 7"  (170.2 cm) Weight: 207 lb 1.6 oz (93.94 kg) IBW/kg (Calculated) : 61.6 Heparin Dosing Weight: 79.8Kg  Vital Signs: Temp: 98.9 F (37.2 C) (11/18 0344) Temp Source: Oral (11/18 0344) BP: 120/60 mmHg (11/18 0344) Pulse Rate: 88 (11/18 0344)  Labs:  Recent Labs  02/22/14 2240 02/22/14 2300 02/23/14 0535 02/23/14 0800  HGB 10.3*  --   --   --   HCT 31.2*  --   --   --   PLT 411*  --   --   --   LABPROT 14.6  --  14.8  --   INR 1.13  --  1.15  --   HEPARINUNFRC  --   --   --  0.17*  CREATININE 1.00  --  0.84  --   TROPONINI  --  <0.30 2.36*  --    Estimated Creatinine Clearance: 74.3 mL/min (by C-G formula based on Cr of 0.84).  Medical History: Past Medical History  Diagnosis Date  . Ulcerative colitis 2001    Last colonoscopy Dr [name] 12/28/09-> pan-UC, normal TI, maintained on Asacol and Imuran  . HTN (hypertension)   . OA (osteoarthritis)   . Hypercholesteremia   . Hepatitis 1972    ? unknown type  . Otitis media, chronic   . Basal cell cancer     left temple  . Anxiety   . Depression   . Headache    Assessment: 8 hour heparin level = 0.17 in this 68yo female on IV heparin drip at 1000 units/hr for  ACS/ NSTEMI.  Heparin level is less that goal.  Cardiologist plans for cardiac cath today. Dr. Mare Ferrari notes that the patient is mildly anemic but has not been aware of any active GI blood loss. No bleeding noted this admit.   Goal of Therapy:  Heparin level 0.3-0.7 units/ml Monitor platelets by anticoagulation protocol: Yes   Plan:  Heparin 2000 units bolus x 1 Increase heparin infusion rate to 1250 units/hr (using ABW)) Heparin level in 6-8 hrs or f/u after cardiac cath.  Daily heparin level and CBC.    Nicole Cella, RPh Clinical Pharmacist Pager: [phone] 02/23/2014,9:05  AM

## 2014-02-23 NOTE — CV Procedure (Signed)
Cardiac Catheterization Operative Report  Bonnie Powers [id number] 11/18/201511:06 AM Velta Addison, [name], DO  Procedure Performed:  1. Left Heart Catheterization 2. Selective Coronary Angiography 3. Left ventricular angiogram  Operator: Lauree Chandler, MD  Arterial access site:  Right radial artery.   Indication: 68 yo female with history of HTN, tobacco abuse, UC admitted with chest pain/NSTEMI.  She has active ulcerative colitis but no recent GI bleeding.                                   Procedure Details: The risks, benefits, complications, treatment options, and expected outcomes were discussed with the patient. The patient and/or family concurred with the proposed plan, giving informed consent. The patient was brought to the cath lab after IV hydration was begun and oral premedication was given. The patient was further sedated with Versed and Fentanyl. The right wrist was assessed with a modified Allens test which was positive. The right wrist was prepped and draped in a sterile fashion. 1% lidocaine was used for local anesthesia. Using the modified Seldinger access technique, a 5 French sheath was placed in the right radial artery. 3 mg Verapamil was given through the sheath. 5000 units IV heparin was given. Standard diagnostic catheters were used to perform selective coronary angiography. A pigtail catheter was used to perform a left ventricular angiogram.   The sheath was removed from the right radial artery and a hemostasis band was applied at the arteriotomy site on the right wrist. There were no immediate complications. The patient was taken to the recovery area in stable condition.   Hemodynamic Findings: Central aortic pressure: 133/55 Left ventricular pressure: 137/7/20  Angiographic Findings:  Left main: No obstructive disease.   Left Anterior Descending Artery: Large caliber vessel that courses to the apex. The proximal vessel has a 95% stenosis just  before the takeoff of the moderate caliber diagonal branch. Just beyond the takeoff of the diagonal branch, the mid LAD has an aneurysmal segment followed by a 30% stenosis. The remainder of the mid and distal LAD has no obstructive disease. The diagonal branch is a moderate caliber, bifurcating vessel with ostial 50% stenosis.   Circumflex Artery: Large caliber vessel with large obtuse marginal branch. There is mild plaque in the mid Circumflex.   Right Coronary Artery: Large dominant vessel with diffuse 40% proximal stenosis followed by 95% stenosis in the proximal to mid vessel. The remainder of the mid and distal vessel has mild plaque disease.   Left Ventricular Angiogram: LVEF=45% with hypokinesis of the anterior wall and apex.   Impression: 1. 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 NSTEMI 3. Moderate segmental LV systolic dysfunction  Recommendations: She has severe double vessel disease involving the proximal LAD at the bifurcation of the diagonal and severe disease in the proximal RCA. Both lesions would be favorable for PCI but given her history of ulcerative colitis with GI bleeding we would need to limit dual anti-platelet therapy and place bare metal stents in both lesions. This would not be optimal and higher risk for restenosis. I would favor CABG given her risk for bleeding on dual anti-platelet therapy. I will ask CT surgery to see her and give an opinion on surgical revascularization. Will restart heparin drip in 6 hours. Continue beta blocker, statin, ASA.  Complications:  None. The patient tolerated the procedure well.

## 2014-02-23 NOTE — Progress Notes (Signed)
CRITICAL VALUE ALERT  Critical value received:  Troponin 2.36  Date of notification:  02/23/14  Time of notification:  0620  Critical value read back: yes  Nurse who received alert:  Quentin Angst  MD notified (1st page):  Grandville Silos  Time of first page:  0621  Responding MD:  Grandville Silos   Time MD responded:  [phone]

## 2014-02-23 NOTE — Consult Note (Signed)
[facility] Consult: 9:12 AM 02/23/2014  LOS: 1 day    Referring Provider: Dr. Mare Ferrari  Primary Care Physician:  Velta Addison, [name], DO Primary Gastroenterologist:  Dr. Lianne Moris     Reason for Consultation:  Frequent diarrhea in patient with long history of ulcerative colitis.   HPI: Bonnie Powers is a 68 y.o. female.  Past medical history of hypertension, ulcerative colitis and tobacco abuse.  The patient was diagnosed with ulcerative colitis in 2006.  She is taken mesalamine 800 mg 3 times a day for many years along with 150 mg of azathioprine daily.  He has never required surgery for her IBD.  At her baseline she has about 3 bowel movements a day. Beginning 2 or 3 months ago she started having multiple stools which were mucoid, nonbloody and associated with fecal urgency, sometimes incontinence and pelvic/lower abdominal cramping discomfort. An episode lasted a few weeks and ended baby 2 months ago. However in the last 2-3 weeks she's had a recurrence of symptoms with 20-30 small, mucoid and still nonbloody bowel movements within a 24-hour period. Bowel movements wake her up at night. She has fecal urgency. Her appetite is not affected, there is no nausea, she's never had heartburn.  Her latest colonoscopy was in September 2011 and showed an ulcerative colitis.  At that time she had just relocated to [facility] and had been off UC meds for "quite a while".  She has had courses of prednisone tapers for flares in the past but it is been more than one year since she required any prednisone for a flare. Back in September 2014 she had run out of medications and flared at that time.She did arrange for office follow-up this week with Dr. Sydell Axon.  However yesterday evening she developed resting chest  pressure/pain and went to the [facility] EGD. She has been ruled in for an Non STEMI.  She has cardiac catheterization planned for this afternoon.  We are asked for GI guidance regarding her colitis     Past Medical History  Diagnosis Date  . Ulcerative colitis 2001    Last colonoscopy Dr [name] 12/28/09-> pan-UC, normal TI, maintained on Asacol and Imuran  . HTN (hypertension)   . OA (osteoarthritis)   . Hypercholesteremia   . Hepatitis 1972    ? unknown type  . Otitis media, chronic   . Basal cell cancer     left temple  . Anxiety   . Depression   . Headache     Past Surgical History  Procedure Laterality Date  . Salpingoophorectomy      right ovary, both tubes  . Tonsillectomy    . Eye surgery    . Colonoscopy  12/28/09    Dr. [name] colitis    Prior to Admission medications   Medication Sig Start Date End Date Taking? Authorizing Provider  acetaminophen (TYLENOL) 500 MG tablet Take 500-1,000 mg by mouth daily.   Yes Historical Provider, MD  azaTHIOprine (IMURAN) 50 MG tablet TAKE 3 TABLETS BY MOUTH EVERY DAY Patient taking  differently: Take 150 mg by mouth daily.  03/10/13  Yes Mahala Menghini, PA-C  Calcium Citrate-Vitamin D (CALCITRATE PLUS D PO) Take 1 tablet by mouth daily. Calcium Citrate 630/Vitamin d 500   Yes Historical Provider, MD  cholecalciferol (VITAMIN D) 1000 UNITS tablet Take 1,000 Units by mouth daily.   Yes Historical Provider, MD  enalapril (VASOTEC) 20 MG tablet Take 20 mg by mouth daily.   Yes Historical Provider, MD  ferrous sulfate 325 (65 FE) MG tablet Take 325 mg by mouth 2 (two) times daily with a meal.   Yes Historical Provider, MD  Mesalamine (ASACOL HD) 800 MG TBEC Take 800 mg by mouth 3 (three) times daily.   Yes Historical Provider, MD  Multiple Vitamin (MULTIVITAMIN WITH MINERALS) TABS tablet Take 1 tablet by mouth daily.   Yes Historical Provider, MD  Probiotic Product (PROBIOTIC FORMULA PO) Take 1 tablet by mouth daily.   Yes  Historical Provider, MD  amoxicillin-clavulanate (AUGMENTIN) 875-125 MG per tablet Take 1 tablet by mouth 2 (two) times daily. Patient not taking: Reported on 02/22/2014 12/14/12   Mahala Menghini, PA-C  dicyclomine (BENTYL) 10 MG capsule Take 1 capsule (10 mg total) by mouth 4 (four) times daily as needed. For diarrhea Patient not taking: Reported on 02/22/2014 09/30/12   Mahala Menghini, PA-C  Mesalamine (ASACOL) 400 MG CPDR DR capsule Take 2 capsules (800 mg total) by mouth 3 (three) times daily. Patient not taking: Reported on 02/22/2014 06/07/13   Mahala Menghini, PA-C    Scheduled Meds: . acidophilus  1 capsule Oral Daily  . [START ON 02/24/2014] aspirin  81 mg Oral Pre-Cath  . aspirin EC  81 mg Oral Daily  . atorvastatin  40 mg Oral q1800  . azaTHIOprine  150 mg Oral Daily  . enalapril  20 mg Oral Daily  . ferrous sulfate  325 mg Oral BID WC  . [START ON 02/24/2014] Influenza vac split quadrivalent PF  0.5 mL Intramuscular Tomorrow-1000  . Mesalamine  800 mg Oral TID WC  . metoprolol tartrate  12.5 mg Oral BID  . nicotine  21 mg Transdermal Daily  . sodium chloride  3 mL Intravenous Q12H   Infusions: . sodium chloride 1,000 mL (02/22/14 2303)  . heparin 1,000 Units/hr (02/23/14 0034)   PRN Meds: sodium chloride, acetaminophen, nitroGLYCERIN, ondansetron (ZOFRAN) IV, sodium chloride   Allergies as of 02/22/2014 - Review Complete 02/22/2014  Allergen Reaction Noted  . Aspirin  02/06/2011    Family History  Problem Relation Age of Onset  . Skin cancer Father   . Heart failure Mother   . HIV Brother   . Lung cancer Brother     History   Social History  . Marital Status: Divorced    Spouse Name: N/A    Number of Children: 3  . Years of Education: N/A   Occupational History  . retired     Conservator, museum/gallery   Social History Main Topics  . Smoking status: Current Every Day Smoker -- 1.00 packs/day for 30 years    Types: Cigarettes  . Smokeless tobacco: Never Used      Comment: trying  . Alcohol Use: No  . Drug Use: No  . Sexual Activity: No   Other Topics Concern  . Not on file   Social History Narrative   Lost 1 daughter w/ meningitis    REVIEW OF SYSTEMS: Constitutional:  Patient says about a year ago, through efforts she lost about  50 pounds within the last year has slowly added 20 pounds back. She doesn't have any particular exercise limitations.  She has not been in contact with any incidents or toddlers in diapers. No contact with people with gastrointestinal illnesses. ENT:  No nose bleeds Pulm:  No cough, no dyspnea no orthopnea CV:  No palpitations, positive for chest pressure. She does get occasional swelling mostly in her right ankle where she has had any previous injury as well as knee surgery on that same side. GU:  No hematuria, no frequency.   GI:  No dysphagia. See history of present illness Heme:  She does take iron.   Transfusions:  The only transfusion she ever had were with childbirth many decades ago. Neuro:  No headaches, no peripheral tingling or numbness Derm:  No itching, no rash or sores.  Endocrine:  No sweats or chills.  No polyuria or dysuria Immunization:  Did not inquire Travel:  She occasionally travels to [place].   PHYSICAL EXAM: Vital signs in last 24 hours: Filed Vitals:   02/23/14 0344  BP: 120/60  Pulse: 88  Temp: 98.9 F (37.2 C)  Resp: 18   Wt Readings from Last 3 Encounters:  02/23/14 207 lb 1.6 oz (93.94 kg)  12/14/12 186 lb 12.8 oz (84.732 kg)  09/30/12 200 lb 12.8 oz (91.082 kg)    General: pleasant, obese, comfortable somewhat chronically ill appearing white female Head:  No asymmetry, no signs of trauma.  Eyes:  No icterus or pallor. Ears:  She is not hard of hearing  Nose:  No congestion or discharge Mouth:  Dentition are in good repair with some plaque, no significant gum disease Neck:  JVD, no mass no goiter. Lungs:  Clear to auscultation and percussion bilaterally. No shortness  of breath or cough Heart: RRR, no MRG Abdomen:  Somewhat obese, non-distended, soft. Bowel sounds hypoactive. No organomegaly. Mid transverse lower abdominal scar consistent with her previous GYN surgery there is moderate to mild tenderness in the pelvis/bilateral lower quadrants.  no guarding or rebound.   Rectal: deferred   Musc/Skeltl: no contracture deformities, no joint erythema or swelling Extremities:  No pedal or ankle or lower extremity edema  Neurologic:  Pleasant, oriented 3. No tremor, no limb weakness, no gross deficits Skin:  No AVMs, no sores no rashes Tattoos:  none Nodes:  No cervical adenopathy   Psych:  Pleasant, relaxed, cooperative not depressed or agitated  Intake/Output from previous day:   Intake/Output this shift:    LAB RESULTS:  Recent Labs  02/22/14 2240  WBC 11.7*  HGB 10.3*  HCT 31.2*  PLT 411*  MCV     84  BMET Lab Results  Component Value Date   NA 140 02/23/2014   NA 136* 02/22/2014   NA 139 09/30/2012   K 4.5 02/23/2014   K 3.6* 02/22/2014   K 4.2 09/30/2012   CL 105 02/23/2014   CL 98 02/22/2014   CL 106 09/30/2012   CO2 23 02/23/2014   CO2 23 02/22/2014   CO2 25 09/30/2012   GLUCOSE 106* 02/23/2014   GLUCOSE 117* 02/22/2014   GLUCOSE 92 09/30/2012   BUN 13 02/23/2014   BUN 18 02/22/2014   BUN 8 09/30/2012   CREATININE 0.84 02/23/2014   CREATININE 1.00 02/22/2014   CREATININE 0.74 09/30/2012   CALCIUM 9.2 02/23/2014   CALCIUM 9.5 02/22/2014   CALCIUM 9.6 09/30/2012   LFT  Recent Labs  02/22/14 2240  PROT 7.2  ALBUMIN 3.0*  AST 12  ALT 8  ALKPHOS 52  BILITOT 0.2*   PT/INR Lab Results  Component Value Date   INR 1.15 02/23/2014   INR 1.13 02/22/2014   Hepatitis Panel No results for input(s): HEPBSAG, HCVAB, HEPAIGM, HEPBIGM in the last 72 hours. C-Diff No components found for: CDIFF Lipase  No results found for: LIPASE  Drugs of Abuse  No results found for: LABOPIA, COCAINSCRNUR, LABBENZ, AMPHETMU,  THCU, LABBARB   RADIOLOGY STUDIES: Dg Chest 2 View  02/22/2014   CLINICAL DATA:  Chest pain and jaw pain  EXAM: CHEST  2 VIEW  COMPARISON:  09/06/2013  FINDINGS: Cardiac shadow is at the upper limits of normal in size. The lungs are well aerated bilaterally. Aortic calcifications are again seen. No acute bony abnormality is noted. Chronic mid thoracic compression deformity is noted.  IMPRESSION: No acute abnormality noted.   Electronically Signed   By: Inez Catalina M.D.   On: 02/22/2014 23:55    ENDOSCOPIC STUDIES: Per history of present illness C 2011 colonoscopy report. Patient has never had upper endoscopy  IMPRESSION:   *  Ulcerative colitis. 2 weeks into flare with multiple nonbloody bowel movements and fecal urgency/incontinence and pain. Previously stable on long-term, stable toast, asacol and Imuran  *  NSTEMI.  Cardiac cath planned today.   *  Normocytic anemia. Hemoglobin baseline looks to be about 11.5-12 based on assays from 2014. Current MCV of 84 is actually improved from    PLAN:     *  Per Dr Olevia Perches.   Check for C diff.    Azucena Freed  02/23/2014, 9:12 AM Pager: [phone] Attending MD note:   I have taken a history, examined the patient, and reviewed the chart. I agree with the Advanced Practitioner's impression and recommendations. Pt with UC- pancolitis x 10 years, due for follow up colonoscopy. Recent flare up, attributed to family stress.No rectal bleeding. She will be having CABG during this hospitalization. We will avoid using steroids. Plan to increase Mesalamine to 4.8gm/day and continue Imuran 150 mg daily and Dicyclomine 10 mg po tid.  [facility] Pager # [phone]

## 2014-02-23 NOTE — Progress Notes (Signed)
ANTICOAGULATION CONSULT NOTE - Follow up Pharmacy Consult for Heparin Indication: chest pain/ACS  Allergies  Allergen Reactions  . Aspirin    Patient Measurements: Height: 5' 7"  (170.2 cm) Weight: 207 lb 1.6 oz (93.94 kg) IBW/kg (Calculated) : 61.6 Heparin Dosing Weight: 79.8Kg  Vital Signs: Temp: 98.8 F (37.1 C) (11/18 1324) Temp Source: Oral (11/18 1324) BP: 124/60 mmHg (11/18 1324) Pulse Rate: 70 (11/18 1324)  Labs:  Recent Labs  02/22/14 2240 02/22/14 2300 02/23/14 0535 02/23/14 0800 02/23/14 1430  HGB 10.3*  --   --   --  10.2*  HCT 31.2*  --   --   --  32.1*  PLT 411*  --   --   --  362  LABPROT 14.6  --  14.8  --   --   INR 1.13  --  1.15  --   --   HEPARINUNFRC  --   --   --  0.17*  --   CREATININE 1.00  --  0.84  --   --   TROPONINI  --  <0.30 2.36*  --  3.48*   Estimated Creatinine Clearance: 74.3 mL/min (by C-G formula based on Cr of 0.84).  Medical History: Past Medical History  Diagnosis Date  . Ulcerative colitis 2001    Last colonoscopy Dr [name] 12/28/09-> pan-UC, normal TI, maintained on Asacol and Imuran  . HTN (hypertension)   . OA (osteoarthritis)   . Hypercholesteremia   . Hepatitis 1972    ? unknown type  . Otitis media, chronic   . Basal cell cancer     left temple  . Anxiety   . Depression   . Headache    Assessment: S/p cardiac cath, cath shows severe double vessel disease involving the proximal LAD at the bifurcation of the diagonal and severe disease in the proximal RCA.  TCTS consulted for possible surgery.  Heparin drip to resume 6 hours post sheath removed. Sheath removal documented done at 11:35 AM.  No hematoma or bleeding reported.       This AM Dr. Mare Ferrari noted that the patient is mildly anemic but has not been aware of any active GI blood loss. No bleeding noted this admit.   Goal of Therapy:  Heparin level 0.3-0.7 units/ml Monitor platelets by anticoagulation protocol: Yes   Plan:  Resume heparin infusion rate  to 1250 units/hr  6hrs  Post cath removal.  Restart heparin at 17:30 today.  Check heparin level in  ~8 hrs  Daily heparin level and CBC.    Nicole Cella, RPh Clinical Pharmacist Pager: [phone] 02/23/2014,4:32 PM

## 2014-02-23 NOTE — Progress Notes (Signed)
ANTICOAGULATION CONSULT NOTE - Follow Up Consult  Pharmacy Consult for heparin  Indication: chest pain/ACS  Allergies  Allergen Reactions  . Aspirin     Patient Measurements: Height: 5' 7"  (170.2 cm) Weight: 207 lb 1.6 oz (93.94 kg) IBW/kg (Calculated) : 61.6 Heparin Dosing Weight:   Vital Signs: Temp: 98.9 F (37.2 C) (11/18 2052) Temp Source: Oral (11/18 2052) BP: 133/52 mmHg (11/18 2210) Pulse Rate: 73 (11/18 2210)  Labs:  Recent Labs  02/22/14 2240 02/22/14 2300 02/23/14 0535 02/23/14 0800 02/23/14 1430 02/23/14 2312  HGB 10.3*  --   --   --  10.2*  --   HCT 31.2*  --   --   --  32.1*  --   PLT 411*  --   --   --  362  --   LABPROT 14.6  --  14.8  --   --   --   INR 1.13  --  1.15  --   --   --   HEPARINUNFRC  --   --   --  0.17*  --  0.24*  CREATININE 1.00  --  0.84  --   --   --   TROPONINI  --  <0.30 2.36*  --  3.48*  --     Estimated Creatinine Clearance: 74.3 mL/min (by C-G formula based on Cr of 0.84).   Medications:  Heparin infusing at 1250/hr  Assessment: HL is slightly below goal at 0.24 IU/ml. Restarted post sheath removal . No bleeding noted. Goal of Therapy:  Heparin level 0.3-0.7 units/ml Monitor platelets by anticoagulation protocol: Yes   Plan:  Increase heparin to 1400 units/hr and f/u am labs.   Curlene Dolphin 02/23/2014,11:51 PM

## 2014-02-23 NOTE — H&P (View-Only) (Signed)
Patient Name: Bonnie Powers Date of Encounter: 02/23/2014     Principal Problem:   NSTEMI (non-ST elevated myocardial infarction) Active Problems:   UC (ulcerative colitis)   Essential hypertension   Hyperlipidemia   Tobacco abuse   Diarrhea    SUBJECTIVE  CP started around 8pm last night and resolved around 2am this morning. Has not recurred. Denies any SOB. Bonnie Powers was going to see her GI doctor in [place] tomorrow.    CURRENT MEDS . acidophilus  1 capsule Oral Daily  . aspirin EC  81 mg Oral Daily  . atorvastatin  40 mg Oral q1800  . azaTHIOprine  150 mg Oral Daily  . enalapril  20 mg Oral Daily  . ferrous sulfate  325 mg Oral BID WC  . [START ON 02/24/2014] Influenza vac split quadrivalent PF  0.5 mL Intramuscular Tomorrow-1000  . Mesalamine  800 mg Oral TID WC  . metoprolol tartrate  12.5 mg Oral BID  . nicotine  21 mg Transdermal Daily    OBJECTIVE  Filed Vitals:   02/23/14 0145 02/23/14 0200 02/23/14 0213 02/23/14 0344  BP:   128/52 120/60  Pulse: 86 83 83 88  Temp:   98.6 F (37 C) 98.9 F (37.2 C)  TempSrc:   Oral Oral  Resp: 25 23 18 18   Height:    5' 7"  (1.702 m)  Weight:    207 lb 1.6 oz (93.94 kg)  SpO2: 98% 96% 98% 97%   No intake or output data in the 24 hours ending 02/23/14 0814 Filed Weights   02/22/14 2222 02/23/14 0344  Weight: 204 lb (92.534 kg) 207 lb 1.6 oz (93.94 kg)    PHYSICAL EXAM  General: Pleasant, NAD. Neuro: Alert and oriented X 3. Moves all extremities spontaneously. Psych: Normal affect. HEENT:  Normal  Neck: Supple without bruits or JVD. Lungs:  Resp regular and unlabored, CTA. Heart: RRR no s3, s4, or murmurs. Abdomen: Soft, non-tender, non-distended, BS + x 4.  Extremities: No clubbing, cyanosis or edema. DP/PT/Radials 2+ and equal bilaterally.  Accessory Clinical Findings  CBC  Recent Labs  02/22/14 2240  WBC 11.7*  HGB 10.3*  HCT 31.2*  MCV 84.8  PLT 956*   Basic Metabolic Panel  Recent Labs  02/22/14 2240 02/23/14 0535  NA 136* 140  K 3.6* 4.5  CL 98 105  CO2 23 23  GLUCOSE 117* 106*  BUN 18 13  CREATININE 1.00 0.84  CALCIUM 9.5 9.2   Liver Function Tests  Recent Labs  02/22/14 2240  AST 12  ALT 8  ALKPHOS 52  BILITOT 0.2*  PROT 7.2  ALBUMIN 3.0*   Cardiac Enzymes  Recent Labs  02/22/14 2300 02/23/14 0535  TROPONINI <0.30 2.36*   Fasting Lipid Panel  Recent Labs  02/23/14 0535  CHOL 171  HDL 40  LDLCALC 112*  TRIG 94  CHOLHDL 4.3    TELE NSR with HR 70-80s, no significant ventricular ectopy    ECG  NSR with TWI in V1-V4 which appears new when compare to yesterday's EKG   Radiology/Studies  Dg Chest 2 View  02/22/2014   CLINICAL DATA:  Chest pain and jaw pain  EXAM: CHEST  2 VIEW  COMPARISON:  09/06/2013  FINDINGS: Cardiac shadow is at the upper limits of normal in size. The lungs are well aerated bilaterally. Aortic calcifications are again seen. No acute bony abnormality is noted. Chronic mid thoracic compression deformity is noted.  IMPRESSION: No acute abnormality noted.  Electronically Signed   By: Inez Catalina M.D.   On: 02/22/2014 23:55    ASSESSMENT AND PLAN  1. NSTEMI  - TWI in anterior lead, new compare to yesterday's EKG. Trop >2  - Will arrange for cardiac cath today  - continue ASA, BB, ACEI.   - ?if UC flare is a contraindication for DES?, per pt, never needed blood transfusion for UC, had some GI bleeding over a yr ago with UC flare  - risk and benefit include bleeding, renal or vascular injury, arrhythmia, MI, stroke, possibly death (very low probability) explained to the pt who display clear understanding and agree to proceed. Will defer to Interventional Cardiologist. No true allergy toward ASA, however was told to avoid ASA with UC. Continue NPO. Will schedule with cath lab.  2. HTN 3. Tobacco abuse 4. ulcerative colitis flare - resolving  - follow by Dr. [name] of [facility] in [place] as outpatient  -  reportly had fever of 101 at home, afebrile here. Continue mesalamine and azothioprine  - consult GI for management of UC flare 5. HLD: LDL 112  - start low dose statin  Ruben Im Pager: [phone] Patient seen. Agree with above. EKG personally reviewed. Shows new T wave inversion in anteroseptal leads. Troponin elevated. Needs cath. Confounding conditions include history of ulcerative colitis with recent flare of diarrhea for past 6 weeks. Bonnie Powers was to have seen her GI MD Dr. [name] tomorrow. Bonnie Powers is mildly anemic but has not been aware of any active GI blood loss. This may influence the choice of stent. Will defer to interventional cardiologist. We will ask GI to see and help manage her UC. Exam reveals clear lungs. Heart no gallop or rub.

## 2014-02-23 NOTE — Progress Notes (Signed)
Patient Name: Bonnie Powers Date of Encounter: 02/23/2014     Principal Problem:   NSTEMI (non-ST elevated myocardial infarction) Active Problems:   UC (ulcerative colitis)   Essential hypertension   Hyperlipidemia   Tobacco abuse   Diarrhea    SUBJECTIVE  CP started around 8pm last night and resolved around 2am this morning. Has not recurred. Denies any SOB. She was going to see her GI doctor in [place] tomorrow.    CURRENT MEDS . acidophilus  1 capsule Oral Daily  . aspirin EC  81 mg Oral Daily  . atorvastatin  40 mg Oral q1800  . azaTHIOprine  150 mg Oral Daily  . enalapril  20 mg Oral Daily  . ferrous sulfate  325 mg Oral BID WC  . [START ON 02/24/2014] Influenza vac split quadrivalent PF  0.5 mL Intramuscular Tomorrow-1000  . Mesalamine  800 mg Oral TID WC  . metoprolol tartrate  12.5 mg Oral BID  . nicotine  21 mg Transdermal Daily    OBJECTIVE  Filed Vitals:   02/23/14 0145 02/23/14 0200 02/23/14 0213 02/23/14 0344  BP:   128/52 120/60  Pulse: 86 83 83 88  Temp:   98.6 F (37 C) 98.9 F (37.2 C)  TempSrc:   Oral Oral  Resp: 25 23 18 18   Height:    5' 7"  (1.702 m)  Weight:    207 lb 1.6 oz (93.94 kg)  SpO2: 98% 96% 98% 97%   No intake or output data in the 24 hours ending 02/23/14 0814 Filed Weights   02/22/14 2222 02/23/14 0344  Weight: 204 lb (92.534 kg) 207 lb 1.6 oz (93.94 kg)    PHYSICAL EXAM  General: Pleasant, NAD. Neuro: Alert and oriented X 3. Moves all extremities spontaneously. Psych: Normal affect. HEENT:  Normal  Neck: Supple without bruits or JVD. Lungs:  Resp regular and unlabored, CTA. Heart: RRR no s3, s4, or murmurs. Abdomen: Soft, non-tender, non-distended, BS + x 4.  Extremities: No clubbing, cyanosis or edema. DP/PT/Radials 2+ and equal bilaterally.  Accessory Clinical Findings  CBC  Recent Labs  02/22/14 2240  WBC 11.7*  HGB 10.3*  HCT 31.2*  MCV 84.8  PLT 469*   Basic Metabolic Panel  Recent Labs  02/22/14 2240 02/23/14 0535  NA 136* 140  K 3.6* 4.5  CL 98 105  CO2 23 23  GLUCOSE 117* 106*  BUN 18 13  CREATININE 1.00 0.84  CALCIUM 9.5 9.2   Liver Function Tests  Recent Labs  02/22/14 2240  AST 12  ALT 8  ALKPHOS 52  BILITOT 0.2*  PROT 7.2  ALBUMIN 3.0*   Cardiac Enzymes  Recent Labs  02/22/14 2300 02/23/14 0535  TROPONINI <0.30 2.36*   Fasting Lipid Panel  Recent Labs  02/23/14 0535  CHOL 171  HDL 40  LDLCALC 112*  TRIG 94  CHOLHDL 4.3    TELE NSR with HR 70-80s, no significant ventricular ectopy    ECG  NSR with TWI in V1-V4 which appears new when compare to yesterday's EKG   Radiology/Studies  Dg Chest 2 View  02/22/2014   CLINICAL DATA:  Chest pain and jaw pain  EXAM: CHEST  2 VIEW  COMPARISON:  09/06/2013  FINDINGS: Cardiac shadow is at the upper limits of normal in size. The lungs are well aerated bilaterally. Aortic calcifications are again seen. No acute bony abnormality is noted. Chronic mid thoracic compression deformity is noted.  IMPRESSION: No acute abnormality noted.  Electronically Signed   By: Inez Catalina M.D.   On: 02/22/2014 23:55    ASSESSMENT AND PLAN  1. NSTEMI  - TWI in anterior lead, new compare to yesterday's EKG. Trop >2  - Will arrange for cardiac cath today  - continue ASA, BB, ACEI.   - ?if UC flare is a contraindication for DES?, per pt, never needed blood transfusion for UC, had some GI bleeding over a yr ago with UC flare  - risk and benefit include bleeding, renal or vascular injury, arrhythmia, MI, stroke, possibly death (very low probability) explained to the pt who display clear understanding and agree to proceed. Will defer to Interventional Cardiologist. No true allergy toward ASA, however was told to avoid ASA with UC. Continue NPO. Will schedule with cath lab.  2. HTN 3. Tobacco abuse 4. ulcerative colitis flare - resolving  - follow by Dr. [name] of [facility] in [place] as outpatient  -  reportly had fever of 101 at home, afebrile here. Continue mesalamine and azothioprine  - consult GI for management of UC flare 5. HLD: LDL 112  - start low dose statin  Ruben Im Pager: [phone] Patient seen. Agree with above. EKG personally reviewed. Shows new T wave inversion in anteroseptal leads. Troponin elevated. Needs cath. Confounding conditions include history of ulcerative colitis with recent flare of diarrhea for past 6 weeks. She was to have seen her GI MD Dr. [name] tomorrow. She is mildly anemic but has not been aware of any active GI blood loss. This may influence the choice of stent. Will defer to interventional cardiologist. We will ask GI to see and help manage her UC. Exam reveals clear lungs. Heart no gallop or rub.

## 2014-02-23 NOTE — Progress Notes (Signed)
Upon review of Bonnie Powers's AM labs her troponin increased to 2.36.  Given her NSTEMI she should be scheduled for LHC rather than stress.

## 2014-02-23 NOTE — Progress Notes (Signed)
ANTICOAGULATION CONSULT NOTE - Initial Consult  Pharmacy Consult for Heparin Indication: chest pain/ACS  Allergies  Allergen Reactions  . Aspirin    Patient Measurements: Height: 5' 6"  (167.6 cm) Weight: 204 lb (92.534 kg) IBW/kg (Calculated) : 59.3 Heparin Dosing Weight: 79.8Kg  Vital Signs: Temp: 98.1 F (36.7 C) (11/17 2222) Temp Source: Oral (11/17 2222) BP: 135/71 mmHg (11/18 0005) Pulse Rate: 92 (11/18 0015)  Labs:  Recent Labs  02/22/14 2240  HGB 10.3*  HCT 31.2*  PLT 411*  LABPROT 14.6  INR 1.13  CREATININE 1.00   Estimated Creatinine Clearance: 60.9 mL/min (by C-G formula based on Cr of 1).  Medical History: Past Medical History  Diagnosis Date  . Ulcerative colitis 2001    Last colonoscopy Dr [name] 12/28/09-> pan-UC, normal TI, maintained on Asacol and Imuran  . HTN (hypertension)   . OA (osteoarthritis)   . Hypercholesteremia   . Hepatitis 1972    ? unknown type  . Otitis media, chronic   . Basal cell cancer     left temple   Assessment: 68yo female c/o chest pain and pain in jaw.  Asked to initiate Heparin for ACS.   Goal of Therapy:  Heparin level 0.3-0.7 units/ml Monitor platelets by anticoagulation protocol: Yes   Plan:  Heparin 4000 units bolus x 1 Heparin infusion at 12 units/Kg/Hr (using ADJ BW) Heparin level in 6-8 hrs then daily Monitor CBC  [place] Crane, Jackelyne Sayer A 02/23/2014,12:27 AM

## 2014-02-24 ENCOUNTER — Ambulatory Visit: Payer: Medicare HMO | Admitting: Gastroenterology

## 2014-02-24 ENCOUNTER — Inpatient Hospital Stay (HOSPITAL_COMMUNITY): Payer: Medicare HMO

## 2014-02-24 ENCOUNTER — Encounter: Payer: Self-pay | Admitting: Internal Medicine

## 2014-02-24 DIAGNOSIS — I25110 Atherosclerotic heart disease of native coronary artery with unstable angina pectoris: Secondary | ICD-10-CM

## 2014-02-24 DIAGNOSIS — I10 Essential (primary) hypertension: Secondary | ICD-10-CM

## 2014-02-24 DIAGNOSIS — Z01810 Encounter for preprocedural cardiovascular examination: Secondary | ICD-10-CM

## 2014-02-24 LAB — BLOOD GAS, ARTERIAL
Acid-base deficit: 3.1 mmol/L — ABNORMAL HIGH (ref 0.0–2.0)
Bicarbonate: 20.5 mEq/L (ref 20.0–24.0)
Drawn by: 41977
FIO2: 0.21 %
O2 Saturation: 95.6 %
Patient temperature: 98.6
TCO2: 21.5 mmol/L (ref 0–100)
pCO2 arterial: 31.2 mmHg — ABNORMAL LOW (ref 35.0–45.0)
pH, Arterial: 7.433 (ref 7.350–7.450)
pO2, Arterial: 76.5 mmHg — ABNORMAL LOW (ref 80.0–100.0)

## 2014-02-24 LAB — PULMONARY FUNCTION TEST
DL/VA % pred: 89 %
DL/VA: 4.32 ml/min/mmHg/L
DLCO cor % pred: 64 %
DLCO cor: 15.63 ml/min/mmHg
DLCO unc % pred: 55 %
DLCO unc: 13.51 ml/min/mmHg
FEF 25-75 Post: 0.36 L/sec
FEF 25-75 Pre: 1.65 L/sec
FEF2575-%Change-Post: -78 %
FEF2575-%Pred-Post: 18 %
FEF2575-%Pred-Pre: 86 %
FEV1-%Change-Post: -25 %
FEV1-%Pred-Post: 62 %
FEV1-%Pred-Pre: 82 %
FEV1-Post: 1.41 L
FEV1-Pre: 1.89 L
FEV1FVC-%Change-Post: -5 %
FEV1FVC-%Pred-Pre: 102 %
FEV6-%Change-Post: -20 %
FEV6-%Pred-Post: 67 %
FEV6-%Pred-Pre: 84 %
FEV6-Post: 1.93 L
FEV6-Pre: 2.42 L
FEV6FVC-%Change-Post: 0 %
FEV6FVC-%Pred-Post: 104 %
FEV6FVC-%Pred-Pre: 104 %
FVC-%Change-Post: -20 %
FVC-%Pred-Post: 64 %
FVC-%Pred-Pre: 80 %
FVC-Post: 1.94 L
FVC-Pre: 2.43 L
Post FEV1/FVC ratio: 73 %
Post FEV6/FVC ratio: 99 %
Pre FEV1/FVC ratio: 78 %
Pre FEV6/FVC Ratio: 100 %
RV % pred: 112 %
RV: 2.45 L
TLC % pred: 96 %
TLC: 4.87 L

## 2014-02-24 LAB — CLOSTRIDIUM DIFFICILE BY PCR: Toxigenic C. Difficile by PCR: NEGATIVE

## 2014-02-24 LAB — CBC
HCT: 30.7 % — ABNORMAL LOW (ref 36.0–46.0)
Hemoglobin: 9.7 g/dL — ABNORMAL LOW (ref 12.0–15.0)
MCH: 26.9 pg (ref 26.0–34.0)
MCHC: 31.6 g/dL (ref 30.0–36.0)
MCV: 85 fL (ref 78.0–100.0)
PLATELETS: 336 10*3/uL (ref 150–400)
RBC: 3.61 MIL/uL — ABNORMAL LOW (ref 3.87–5.11)
RDW: 15.2 % (ref 11.5–15.5)
WBC: 9.4 10*3/uL (ref 4.0–10.5)

## 2014-02-24 LAB — HEPARIN LEVEL (UNFRACTIONATED)
HEPARIN UNFRACTIONATED: 0.41 [IU]/mL (ref 0.30–0.70)
HEPARIN UNFRACTIONATED: 0.32 [IU]/mL (ref 0.30–0.70)
Heparin Unfractionated: 0.16 IU/mL — ABNORMAL LOW (ref 0.30–0.70)

## 2014-02-24 LAB — SEDIMENTATION RATE: Sed Rate: 88 mm/hr — ABNORMAL HIGH (ref 0–22)

## 2014-02-24 LAB — PREALBUMIN: Prealbumin: 12.7 mg/dL — ABNORMAL LOW (ref 17.0–34.0)

## 2014-02-24 LAB — PREPARE RBC (CROSSMATCH)

## 2014-02-24 LAB — ABO/RH: ABO/RH(D): A POS

## 2014-02-24 MED ORDER — BISACODYL 5 MG PO TBEC: 5.0000 mg | DELAYED_RELEASE_TABLET | Freq: Once | ORAL | Status: DC

## 2014-02-24 MED ORDER — SODIUM CHLORIDE 0.9 % IV SOLN
INTRAVENOUS | Status: AC
  Administered 2014-02-25: 69.8 mL/h via INTRAVENOUS
  Filled 2014-02-24: qty 40

## 2014-02-24 MED ORDER — HEPARIN BOLUS VIA INFUSION
2000.0000 [IU] | Freq: Once | INTRAVENOUS | Status: AC
  Administered 2014-02-24: 2000 [IU] via INTRAVENOUS
  Filled 2014-02-24: qty 2000

## 2014-02-24 MED ORDER — SODIUM CHLORIDE 0.9 % IV SOLN
INTRAVENOUS | Status: DC
  Filled 2014-02-24: qty 30

## 2014-02-24 MED ORDER — ALPRAZOLAM 0.25 MG PO TABS: 0.2500 mg | ORAL_TABLET | ORAL | Status: DC | PRN

## 2014-02-24 MED ORDER — DOPAMINE-DEXTROSE 3.2-5 MG/ML-% IV SOLN
0.0000 ug/kg/min | INTRAVENOUS | Status: DC
  Filled 2014-02-24: qty 250

## 2014-02-24 MED ORDER — DEXTROSE 5 % IV SOLN
1.5000 g | INTRAVENOUS | Status: AC
  Administered 2014-02-25: 1.5 g via INTRAVENOUS
  Administered 2014-02-25: .75 g via INTRAVENOUS
  Filled 2014-02-24: qty 1.5

## 2014-02-24 MED ORDER — NITROGLYCERIN IN D5W 200-5 MCG/ML-% IV SOLN
2.0000 ug/min | INTRAVENOUS | Status: AC
  Administered 2014-02-25: 5 ug/min via INTRAVENOUS
  Filled 2014-02-24: qty 250

## 2014-02-24 MED ORDER — TEMAZEPAM 15 MG PO CAPS: 15.0000 mg | ORAL_CAPSULE | Freq: Once | ORAL | Status: AC | PRN

## 2014-02-24 MED ORDER — ALBUTEROL SULFATE (2.5 MG/3ML) 0.083% IN NEBU
2.5000 mg | INHALATION_SOLUTION | Freq: Once | RESPIRATORY_TRACT | Status: AC
  Administered 2014-02-24: 2.5 mg via RESPIRATORY_TRACT

## 2014-02-24 MED ORDER — PAPAVERINE HCL 30 MG/ML IJ SOLN
INTRAMUSCULAR | Status: AC
  Administered 2014-02-25: 500 mL
  Filled 2014-02-24: qty 2.5

## 2014-02-24 MED ORDER — SODIUM CHLORIDE 0.9 % IV SOLN
1500.0000 mg | INTRAVENOUS | Status: AC
  Administered 2014-02-25: 1500 mg via INTRAVENOUS
  Filled 2014-02-24: qty 1500

## 2014-02-24 MED ORDER — EPINEPHRINE HCL 1 MG/ML IJ SOLN
0.0000 ug/min | INTRAVENOUS | Status: DC
  Filled 2014-02-24: qty 4

## 2014-02-24 MED ORDER — MAGNESIUM SULFATE 50 % IJ SOLN
40.0000 meq | INTRAMUSCULAR | Status: DC
  Filled 2014-02-24: qty 10

## 2014-02-24 MED ORDER — PHENYLEPHRINE HCL 10 MG/ML IJ SOLN
30.0000 ug/min | INTRAVENOUS | Status: AC
  Administered 2014-02-25: 25 ug/min via INTRAVENOUS
  Filled 2014-02-24: qty 2

## 2014-02-24 MED ORDER — METOPROLOL TARTRATE 12.5 MG HALF TABLET
12.5000 mg | ORAL_TABLET | Freq: Once | ORAL | Status: AC
  Administered 2014-02-25: 12.5 mg via ORAL
  Filled 2014-02-24: qty 1

## 2014-02-24 MED ORDER — POTASSIUM CHLORIDE 2 MEQ/ML IV SOLN
80.0000 meq | INTRAVENOUS | Status: DC
  Filled 2014-02-24: qty 40

## 2014-02-24 MED ORDER — FLUCONAZOLE 150 MG PO TABS
150.0000 mg | ORAL_TABLET | Freq: Every day | ORAL | Status: DC
  Administered 2014-02-24 – 2014-03-02 (×6): 150 mg via ORAL
  Filled 2014-02-24 (×7): qty 1

## 2014-02-24 MED ORDER — DEXMEDETOMIDINE HCL IN NACL 400 MCG/100ML IV SOLN
0.1000 ug/kg/h | INTRAVENOUS | Status: AC
  Administered 2014-02-25: 0.2 ug/kg/h via INTRAVENOUS
  Filled 2014-02-24: qty 100

## 2014-02-24 MED ORDER — INSULIN REGULAR HUMAN 100 UNIT/ML IJ SOLN
INTRAMUSCULAR | Status: AC
  Administered 2014-02-25: 2 [IU]/h via INTRAVENOUS
  Filled 2014-02-24: qty 2.5

## 2014-02-24 MED ORDER — DEXTROSE 5 % IV SOLN
750.0000 mg | INTRAVENOUS | Status: DC
  Filled 2014-02-24 (×2): qty 750

## 2014-02-24 NOTE — Plan of Care (Signed)
Problem: Phase II Progression Outcomes Goal: Anginal pain relieved Outcome: Completed/Met Date Met:  02/24/14 Goal: Cath/PCI Day Path if indicated Outcome: Completed/Met Date Met:  02/24/14  Problem: Phase I Progression Outcomes Goal: Pain controlled with appropriate interventions Outcome: Completed/Met Date Met:  02/24/14 Goal: Voiding-avoid urinary catheter unless indicated Outcome: Completed/Met Date Met:  02/24/14 Goal: Distal pulses equal to baseline Outcome: Completed/Met Date Met:  02/24/14 Goal: Vascular site scale level 0 - I Vascular Site Scale Level 0: No bruising/bleeding/hematoma Level I (Mild): Bruising/Ecchymosis, minimal bleeding/ooozing, palpable hematoma < 3 cm Level II (Moderate): Bleeding not affecting hemodynamic parameters, pseudoaneurysm, palpable hematoma > 3 cm Level III (Severe) Bleeding which affects hemodynamic parameters or retroperitoneal hemorrhage  Outcome: Completed/Met Date Met:  02/24/14

## 2014-02-24 NOTE — Progress Notes (Signed)
  Echocardiogram 2D Echocardiogram has been performed.  Bonnie Powers 02/24/2014, 1:50 PM

## 2014-02-24 NOTE — Progress Notes (Addendum)
Pre-op Cardiac Surgery  Carotid Findings:  1-39% ICA stenosis.  Vertebral artery flow is antegrade.   Sharion Dove, RVS 02/24/2014   Upper Extremity Right Left  Brachial Pressures 149  Triphasic  162  Triphasic   Radial Waveforms Triphasic  Triphasic   Ulnar Waveforms Triphasic  Triphasic   Palmar Arch (Allen's Test) Normal with radial compression, obliterates with ulnar compression. Obliterates with radial compression, normal with ulnar compression     Lower  Extremity Right Left  Dorsalis Pedis    Anterior Tibial Palpable   Posterior Tibial    Ankle/Brachial Indices     Ralene Cork, RVT 02/24/2014 7:00 PM

## 2014-02-24 NOTE — Progress Notes (Signed)
ANTICOAGULATION CONSULT NOTE - Follow Up Consult  Pharmacy Consult for heparin  Indication: atrial fibrillation  Allergies  Allergen Reactions  . Aspirin     Patient Measurements: Height: 5' 7"  (170.2 cm) Weight: 207 lb 1.6 oz (93.94 kg) IBW/kg (Calculated) : 61.6 Heparin Dosing Weight:   Vital Signs: Temp: 98.8 F (37.1 C) (11/19 0540) Temp Source: Oral (11/19 0540) BP: 115/68 mmHg (11/19 0540) Pulse Rate: 80 (11/19 0540)  Labs:  Recent Labs  02/22/14 2240 02/22/14 2300 02/23/14 0535 02/23/14 0800 02/23/14 1430 02/23/14 2312 02/24/14 0405  HGB 10.3*  --   --   --  10.2*  --  9.7*  HCT 31.2*  --   --   --  32.1*  --  30.7*  PLT 411*  --   --   --  362  --  336  LABPROT 14.6  --  14.8  --   --   --   --   INR 1.13  --  1.15  --   --   --   --   HEPARINUNFRC  --   --   --  0.17*  --  0.24* 0.16*  CREATININE 1.00  --  0.84  --   --   --   --   TROPONINI  --  <0.30 2.36*  --  3.48*  --   --     Estimated Creatinine Clearance: 74.3 mL/min (by C-G formula based on Cr of 0.84).   Medications:  hepairn infusing at 1400 units/hr.  Assessment: Heparin level dropped to 0.16 this am despite rate increase. No bleeding noted. No infusion related issues per RN Goal of Therapy:  Heparin level 0.3-0.7 units/ml Monitor platelets by anticoagulation protocol: Yes   Plan: Heparin bolus 2000 units and increase rate to 1600 units/hr.  Recheck HL in 8 hours.   Curlene Dolphin 02/24/2014,6:12 AM

## 2014-02-24 NOTE — Consult Note (Signed)
[address]       [place] [address]             [phone]        Chiana L Lupa [place] Medical Record #[id number] Date of Birth: [birth year]  Referring: No ref. provider found Primary Care: Velta Addison, [name], DO  Chief Complaint:    Chief Complaint  Patient presents with  . Chest Pain  patient examined and coronary angiogram reviewed   History of Present Illness:     68 yo obese smoker with chronic ulcerative colitis on imuran presents with hx of unstable angina. She was having exertional angina which increased to resting angina at the time of admission. Her cardiac enzymes are mildly elevated-she had nonspecific EKG changes. She underwent cardiac catheterization to define her coronary anatomy. This was performed via the right radial artery and demonstrated high-grade 95% stenosis of the dominant RCA, 90% stenosis of the proximal LAD and 50% stenosis of the first diagonal branch of the LAD. LV systolic function was preserved. 2-D echocardiogram is pending. She is placed on IV heparin and has had no further chest pain. She is felt to be candidate for surgical coronary revascularization because of her chronic ulcerative colitis and significant potential problems with remaining on double antiplatelet agents with bleeding risk.  The patient smokes a pack of cigarettes daily. Her PFTs show 50% of predicted for her mechanics and diffusion capacity. Chest x-ray shows no active disease but with changes of emphysema.  Current Activity/ Functional Status: Patient is able to do normal activities of daily living   Zubrod Score: At the time of surgery this patient's most appropriate activity status/level should be described as: []     0    Normal activity, no symptoms [x]     1    Restricted in physical strenuous activity but ambulatory, able to do out light work []     2    Ambulatory and capable of self care, unable to do work activities, up and about                  more than 50%  Of the time                            []     3    Only limited self care, in bed greater than 50% of waking hours []     4    Completely disabled, no self care, confined to bed or chair []     5    Moribund  Past Medical History  Diagnosis Date  . Ulcerative colitis 2001    Last colonoscopy Dr [name] 12/28/09-> pan-UC, normal TI, maintained on Asacol and Imuran  . HTN (hypertension)   . OA (osteoarthritis)   . Hypercholesteremia   . Hepatitis 1972    ? unknown type  . Otitis media, chronic   . Basal cell cancer     left temple  . Anxiety   . Depression   . Headache     Past Surgical History  Procedure Laterality Date  . Salpingoophorectomy      right ovary, both tubes  . Tonsillectomy    . Eye surgery    . Colonoscopy  12/28/09    Dr. [name] colitis    History  Smoking status  . Current Every Day Smoker -- 1.00 packs/day for 30 years  . Types: Cigarettes  Smokeless tobacco  .  Never Used    Comment: trying    History  Alcohol Use No    History   Social History  . Marital Status: Divorced    Spouse Name: N/A    Number of Children: 3  . Years of Education: N/A   Occupational History  . retired     Conservator, museum/gallery   Social History Main Topics  . Smoking status: Current Every Day Smoker -- 1.00 packs/day for 30 years    Types: Cigarettes  . Smokeless tobacco: Never Used     Comment: trying  . Alcohol Use: No  . Drug Use: No  . Sexual Activity: No   Other Topics Concern  . Not on file   Social History Narrative   Lost 1 daughter w/ meningitis    Allergies  Allergen Reactions  . Aspirin     Current Facility-Administered Medications  Medication Dose Route Frequency Provider Last Rate Last Dose  . acetaminophen (TYLENOL) tablet 650 mg  650 mg Oral Q4H PRN Skeet Latch, MD   650 mg at 02/23/14 2029  . acidophilus (RISAQUAD) capsule 1 capsule  1 capsule Oral Daily Skeet Latch, MD   1 capsule at 02/24/14 1041  .  ALPRAZolam Duanne Moron) tablet 0.25-0.5 mg  0.25-0.5 mg Oral Q4H PRN Ivin Poot, MD      . aspirin EC tablet 81 mg  81 mg Oral Daily Skeet Latch, MD   81 mg at 02/24/14 1041  . atorvastatin (LIPITOR) tablet 40 mg  40 mg Oral q1800 Skeet Latch, MD   40 mg at 02/23/14 1750  . azaTHIOprine (IMURAN) tablet 150 mg  150 mg Oral Daily Skeet Latch, MD   150 mg at 02/24/14 1041  . budesonide-formoterol (SYMBICORT) 160-4.5 MCG/ACT inhaler 2 puff  2 puff Inhalation BID Ivin Poot, MD   2 puff at 02/23/14 2026  . enalapril (VASOTEC) tablet 20 mg  20 mg Oral Daily Skeet Latch, MD   20 mg at 02/24/14 1041  . ferrous sulfate tablet 325 mg  325 mg Oral BID WC Skeet Latch, MD   325 mg at 02/24/14 1041  . heparin ADULT infusion 100 units/mL (25000 units/250 mL)  1,600 Units/hr Intravenous Continuous Larey Dresser, MD 16 mL/hr at 02/24/14 0641 1,600 Units/hr at 02/24/14 0641  . Influenza vac split quadrivalent PF (FLUARIX) injection 0.5 mL  0.5 mL Intramuscular Tomorrow-1000 Larey Dresser, MD      . Mesalamine (ASACOL) DR capsule 800 mg  800 mg Oral TID WC Skeet Latch, MD   800 mg at 02/24/14 0641  . metoprolol tartrate (LOPRESSOR) tablet 12.5 mg  12.5 mg Oral BID Skeet Latch, MD   12.5 mg at 02/24/14 1041  . nicotine (NICODERM CQ - dosed in mg/24 hours) patch 21 mg  21 mg Transdermal Daily Skeet Latch, MD   21 mg at 02/24/14 1042  . nitroGLYCERIN (NITROSTAT) SL tablet 0.4 mg  0.4 mg Sublingual Q5 Min x 3 PRN Skeet Latch, MD      . ondansetron [facility]) injection 4 mg  4 mg Intravenous Q6H PRN Skeet Latch, MD        Prescriptions prior to admission  Medication Sig Dispense Refill Last Dose  . acetaminophen (TYLENOL) 500 MG tablet Take 500-1,000 mg by mouth daily.   Past Week at Unknown time  . azaTHIOprine (IMURAN) 50 MG tablet TAKE 3 TABLETS BY MOUTH EVERY DAY (Patient taking differently: Take 150 mg by mouth daily. ) 270 tablet 3 02/22/2014 at  Unknown time    . Calcium Citrate-Vitamin D (CALCITRATE PLUS D PO) Take 1 tablet by mouth daily. Calcium Citrate 630/Vitamin d 500   02/22/2014 at Unknown time  . cholecalciferol (VITAMIN D) 1000 UNITS tablet Take 1,000 Units by mouth daily.   02/22/2014 at Unknown time  . enalapril (VASOTEC) 20 MG tablet Take 20 mg by mouth daily.   02/22/2014 at Unknown time  . ferrous sulfate 325 (65 FE) MG tablet Take 325 mg by mouth 2 (two) times daily with a meal.   02/22/2014 at Unknown time  . Mesalamine (ASACOL HD) 800 MG TBEC Take 800 mg by mouth 3 (three) times daily.   02/22/2014 at Unknown time  . Multiple Vitamin (MULTIVITAMIN WITH MINERALS) TABS tablet Take 1 tablet by mouth daily.   02/22/2014 at Unknown time  . Probiotic Product (PROBIOTIC FORMULA PO) Take 1 tablet by mouth daily.   02/22/2014 at Unknown time  . amoxicillin-clavulanate (AUGMENTIN) 875-125 MG per tablet Take 1 tablet by mouth 2 (two) times daily. (Patient not taking: Reported on 02/22/2014) 14 tablet 0   . dicyclomine (BENTYL) 10 MG capsule Take 1 capsule (10 mg total) by mouth 4 (four) times daily as needed. For diarrhea (Patient not taking: Reported on 02/22/2014) 90 capsule 3 Taking  . Mesalamine (ASACOL) 400 MG CPDR DR capsule Take 2 capsules (800 mg total) by mouth 3 (three) times daily. (Patient not taking: Reported on 02/22/2014) 180 capsule 11     Family History  Problem Relation Age of Onset  . Skin cancer Father   . Heart failure Mother   . HIV Brother   . Lung cancer Brother      Review of Systems:   The patient is currently not on steroids. She usually takes prednisone for flareup in her chronic ulcerative colitis The patient has up to 20-30 stools per day from ulcerative colitis. The patient has been evaluated by gastroenterology who feel that her ulcerative colitis is stable and would not preclude urgent CABG for unstable angina. 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  skin  folds    Cardiac Review of Systems: Y or N  Chest Pain [  yes  ]  Resting SOB [ no  ] Exertional SOB  [ yes ]  Lamarr Lulas  ]   Pedal Edema [  no ]    Palpitations [place].Blacker  ] Syncope  [ no ]   Presyncope [ no  ]  General Review of Systems: [Y] = yes [  ]=no Constitional: recent weight change [  ]; anorexia [  ]; fatigue [  ]; nausea [  ]; night sweats [  ]; fever [  ]; or chills [  ]                                                               Dental: poor dentition[  ]; Last Dentist visit:   Eye : blurred vision [  ]; diplopia [   ]; vision changes [  ];  Amaurosis fugax[  ]; Resp: cough [  ];  wheezing[  ];  hemoptysis[  ]; shortness of breath[  ]; paroxysmal nocturnal dyspnea[  ]; dyspnea on exertion[  ];  or orthopnea[  ];  GI:  gallstones[  ], vomiting[  ];  dysphagia[  ]; melena[  ];  hematochezia [  ]; heartburn[  ];   Hx of  Colonoscopy[  ]; GU: kidney stones [  ]; hematuria[  ];   dysuria [  ];  nocturia[  ];  history of     obstruction [  ]; urinary frequency [  ]             Skin: rash, swelling[  ];, hair loss[  ];  peripheral edema[  ];  or itching[  ]; Musculosketetal: myalgias[  ];  joint swelling[  ];  joint erythema[  ];  joint pain[  ];  back pain[  ];  Heme/Lymph: bruising[  ];  bleeding[  ];  anemia[  ];  Neuro: TIA[  ];  headaches[  ];  stroke[  ];  vertigo[  ];  seizures[  ];   paresthesias[  ];  difficulty walking[  ];  Psych:depression[  ]; anxiety[  ];  Endocrine: diabetes[  ];  thyroid dysfunction[  ];  Immunizations: Flu [  ]; Pneumococcal[  ];  Other:  Physical Exam: BP 115/68 mmHg  Pulse 80  Temp(Src) 98.8 F (37.1 C) (Oral)  Resp 20  Ht 5' 7"  (1.702 m)  Wt 207 lb 1.6 oz (93.94 kg)  BMI 32.43 kg/m2  SpO2 95%  Gen. appearance-middle-aged obese female no acute distress HEENT-dentition adequate, pupils equal, normocephalic Neck-no JVD mass or bruit Lungs-distant breath sounds Cardiac-regular  rhythm without murmur or gallop Abdomen-obese soft nontender with bowel sounds present Extremities-no tenderness edema or cyanosis, no varicosities of the lower extremities Neurologic-no focal motor deficit Vascular-no carotid disease by ultrasound, pedal pulses palpable  Diagnostic Studies & Laboratory data:     Recent Radiology Findings:   Dg Chest 2 View  02/22/2014   CLINICAL DATA:  Chest pain and jaw pain  EXAM: CHEST  2 VIEW  COMPARISON:  09/06/2013  FINDINGS: Cardiac shadow is at the upper limits of normal in size. The lungs are well aerated bilaterally. Aortic calcifications are again seen. No acute bony abnormality is noted. Chronic mid thoracic compression deformity is noted.  IMPRESSION: No acute abnormality noted.   Electronically Signed   By: Inez Catalina M.D.   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 Indications benefits alternatives and risks fully discussed with patient who demonstrates her understanding and agrees to proceed with surgery       @ME1 @ 02/24/2014 11:09 AM

## 2014-02-24 NOTE — Progress Notes (Signed)
Patient Name: Bonnie Powers Date of Encounter: 02/24/2014     Principal Problem:   NSTEMI (non-ST elevated myocardial infarction) Active Problems:   UC (ulcerative colitis)   Essential hypertension   Hyperlipidemia   Tobacco abuse   Diarrhea    SUBJECTIVE  Currently undergoing PFT by Dr. Prescott Gum. Denies any CP or SOB overnight.  CURRENT MEDS . acidophilus  1 capsule Oral Daily  . aspirin EC  81 mg Oral Daily  . atorvastatin  40 mg Oral q1800  . azaTHIOprine  150 mg Oral Daily  . budesonide-formoterol  2 puff Inhalation BID  . enalapril  20 mg Oral Daily  . ferrous sulfate  325 mg Oral BID WC  . Influenza vac split quadrivalent PF  0.5 mL Intramuscular Tomorrow-1000  . Mesalamine  800 mg Oral TID WC  . metoprolol tartrate  12.5 mg Oral BID  . nicotine  21 mg Transdermal Daily    OBJECTIVE  Filed Vitals:   02/23/14 2027 02/23/14 2052 02/23/14 2210 02/24/14 0540  BP:  124/64 133/52 115/68  Pulse:  79 73 80  Temp:  98.9 F (37.2 C)  98.8 F (37.1 C)  TempSrc:  Oral  Oral  Resp:  22  20  Height:      Weight:      SpO2: 95% 94%  95%    Intake/Output Summary (Last 24 hours) at 02/24/14 0853 Last data filed at 02/24/14 0641  Gross per 24 hour  Intake 377.97 ml  Output    250 ml  Net 127.97 ml   Filed Weights   02/22/14 2222 02/23/14 0344  Weight: 204 lb (92.534 kg) 207 lb 1.6 oz (93.94 kg)    PHYSICAL EXAM  General: Pleasant, NAD. Neuro: Alert and oriented X 3. Moves all extremities spontaneously. Psych: Normal affect. HEENT:  Normal  Neck: Supple without bruits or JVD. Lungs:  Resp regular and unlabored, CTA. Heart: RRR no s3, s4, or murmurs. Abdomen: Soft, non-tender, non-distended, BS + x 4.  Extremities: No clubbing, cyanosis or edema. DP/PT/Radials 2+ and equal bilaterally.  Accessory Clinical Findings  CBC  Recent Labs  02/23/14 1430 02/24/14 0405  WBC 10.6* 9.4  HGB 10.2* 9.7*  HCT 32.1* 30.7*  MCV 86.5 85.0  PLT 362 867    Basic Metabolic Panel  Recent Labs  02/22/14 2240 02/23/14 0535  NA 136* 140  K 3.6* 4.5  CL 98 105  CO2 23 23  GLUCOSE 117* 106*  BUN 18 13  CREATININE 1.00 0.84  CALCIUM 9.5 9.2   Liver Function Tests  Recent Labs  02/22/14 2240  AST 12  ALT 8  ALKPHOS 52  BILITOT 0.2*  PROT 7.2  ALBUMIN 3.0*   Cardiac Enzymes  Recent Labs  02/22/14 2300 02/23/14 0535 02/23/14 1430  TROPONINI <0.30 2.36* 3.48*   Fasting Lipid Panel  Recent Labs  02/23/14 0535  CHOL 171  HDL 40  LDLCALC 112*  TRIG 94  CHOLHDL 4.3    TELE NSR with HR 60-100s    ECG  NSR with TWI in anterior lead  Echocardiogram  Pending echo    Radiology/Studies  Dg Chest 2 View  02/22/2014   CLINICAL DATA:  Chest pain and jaw pain  EXAM: CHEST  2 VIEW  COMPARISON:  09/06/2013  FINDINGS: Cardiac shadow is at the upper limits of normal in size. The lungs are well aerated bilaterally. Aortic calcifications are again seen. No acute bony abnormality is noted. Chronic mid thoracic compression  deformity is noted.  IMPRESSION: No acute abnormality noted.   Electronically Signed   By: Inez Catalina M.D.   On: 02/22/2014 23:55    ASSESSMENT AND PLAN  1. NSTEMI - TWI in anterior lead, new compare to yesterday's EKG. Trop >2 - continue ASA, BB, ACEI.  - cath 02/23/2014 95% prox LAD, 95% prox to mid RCA. EF 40%. Per Dr. Angelena Form, lesion is treatable with BMS however high risk of restenosis. Given her h/o GI bleed with ulcerative colitis, will ask CT surgery to see for eval of CABG  2. HTN 3. Tobacco abuse: will give nicotin patch 4. ulcerative colitis flare - resolving - follow by Dr. [name] of [facility] in [place] as outpatient - reportly had fever of 101 at home, afebrile here. Continue mesalamine and azothioprine - per GI, no steroid with upcoming surgery.   5. HLD: LDL 112 - on low dose  statin  Ruben Im Pager: [phone] Patient remains stable on IV heparin awaiting CABG tomorrow.  No chest pain. Rhythm stable NSR. Lungs clear. Heart no gallop. She is on moderate statin therapy for NSTEMI.  Unable to take high dose because of prior intolerance (myalgias). Agree with assessment and plan as noted above.

## 2014-02-24 NOTE — Progress Notes (Addendum)
ANTICOAGULATION CONSULT NOTE - Follow Up Consult  Pharmacy Consult for heparin  Indication: atrial fibrillation  Allergies  Allergen Reactions  . Aspirin     Patient Measurements: Height: 5' 7"  (170.2 cm) Weight: 207 lb 1.6 oz (93.94 kg) IBW/kg (Calculated) : 61.6 Heparin Dosing Weight: 82 kg  Vital Signs: Temp: 98.7 F (37.1 C) (11/19 1408) Temp Source: Oral (11/19 1408) BP: 129/55 mmHg (11/19 1408) Pulse Rate: 80 (11/19 1408)  Labs:  Recent Labs  02/22/14 2240 02/22/14 2300 02/23/14 0535  02/23/14 1430 02/23/14 2312 02/24/14 0405 02/24/14 1410  HGB 10.3*  --   --   --  10.2*  --  9.7*  --   HCT 31.2*  --   --   --  32.1*  --  30.7*  --   PLT 411*  --   --   --  362  --  336  --   LABPROT 14.6  --  14.8  --   --   --   --   --   INR 1.13  --  1.15  --   --   --   --   --   HEPARINUNFRC  --   --   --   < >  --  0.24* 0.16* 0.41  CREATININE 1.00  --  0.84  --   --   --   --   --   TROPONINI  --  <0.30 2.36*  --  3.48*  --   --   --   < > = values in this interval not displayed.  Estimated Creatinine Clearance: 74.3 mL/min (by C-G formula based on Cr of 0.84).   Medications:  Heparin infusing at 1600 units/hr.  Assessment: Heparin level = 0.41 on IV heparin drip 1600 units/hr in this 68 y.o female.  No bleeding noted.  Hgb down to 9.7 down from 10.3, pltc 336K.  Goal of Therapy:  Heparin level 0.3-0.7 units/ml Monitor platelets by anticoagulation protocol: Yes   Plan: Continue IV Heparin drip at 1600 units/hr.  Recheck HL in 6 hours to confirm remains therapeutic.  Daily heparin level and CBC.  Nicole Cella, RPh Clinical Pharmacist Pager: [phone] 02/24/2014,4:06 PM    Addendum  -Heparin level therapeutic, but appears to be downtrending -Continue heparin at 1600 units/hr -Recheck with AM labs    Hughes Better, PharmD, BCPS Clinical Pharmacist Pager: [phone] 02/24/2014 10:29 PM

## 2014-02-24 NOTE — Progress Notes (Signed)
   Subjective  Diffuse abdominal tenderness,  One small mushy stool this am   Objective  Ulcerative colitis, mild flare up, awaiting decision  Regarding  heart surgery. Vital signs in last 24 hours: Temp:  [98.8 F (37.1 C)-98.9 F (37.2 C)] 98.8 F (37.1 C) (11/19 0540) Pulse Rate:  [61-80] 80 (11/19 0540) Resp:  [18-28] 20 (11/19 0540) BP: (115-133)/(47-68) 115/68 mmHg (11/19 0540) SpO2:  [94 %-98 %] 95 % (11/19 0540) Last BM Date: 02/23/14 General:    white female in NAD Heart:  Regular rate and rhythm; no murmurs Lungs: Respirations even and unlabored, lungs CTA bilaterally Abdomen:  Soft, diffusely tender but nondistended. Normal bowel sounds., no tympany Extremities:  Without edema. Neurologic:  Alert and oriented,  grossly normal neurologically. Psych:  Cooperative. Normal mood and affect.  Intake/Output from previous day: 11/18 0701 - 11/19 0700 In: 240 [P.O.:240] Out: 250 [Urine:250] Intake/Output this shift:    Lab Results:  Recent Labs  02/22/14 2240 02/23/14 1430 02/24/14 0405  WBC 11.7* 10.6* 9.4  HGB 10.3* 10.2* 9.7*  HCT 31.2* 32.1* 30.7*  PLT 411* 362 336   BMET  Recent Labs  02/22/14 2240 02/23/14 0535  NA 136* 140  K 3.6* 4.5  CL 98 105  CO2 23 23  GLUCOSE 117* 106*  BUN 18 13  CREATININE 1.00 0.84  CALCIUM 9.5 9.2   LFT  Recent Labs  02/22/14 2240  PROT 7.2  ALBUMIN 3.0*  AST 12  ALT 8  ALKPHOS 52  BILITOT 0.2*   PT/INR  Recent Labs  02/22/14 2240 02/23/14 0535  LABPROT 14.6 14.8  INR 1.13 1.15    Studies/Results: Dg Chest 2 View  02/22/2014   CLINICAL DATA:  Chest pain and jaw pain  EXAM: CHEST  2 VIEW  COMPARISON:  09/06/2013  FINDINGS: Cardiac shadow is at the upper limits of normal in size. The lungs are well aerated bilaterally. Aortic calcifications are again seen. No acute bony abnormality is noted. Chronic mid thoracic compression deformity is noted.  IMPRESSION: No acute abnormality noted.   Electronically  Signed   By: Inez Catalina M.D.   On: 02/22/2014 23:55       Assessment / Plan:   Mild flare up of UC, will not start steroids in anticipation of surgery. We have doubled up on Mesalamine  t 4.8 gm and continue Imuran 150 mg/day.  Continue Bentyl 10 mg po tid Check sed. Rate.  Pt will return to Dr [facility] in [place]  after discharge, will sign off  Principal Problem:   NSTEMI (non-ST elevated myocardial infarction) Active Problems:   Diarrhea   UC (ulcerative colitis)   Essential hypertension   Hyperlipidemia   Tobacco abuse     LOS: 2 days   Delfin Edis  02/24/2014, 7:04 AM

## 2014-02-24 NOTE — Progress Notes (Signed)
CARDIAC REHAB PHASE I   PRE:  Rate/Rhythm: 96 SR  BP:  Supine:   Sitting: 151/61  Standing:    SaO2: 98 RA  MODE:  Ambulation: 550 ft   POST:  Rate/Rhythm: 90  BP:  Supine:   Sitting: 145/59  Standing:    SaO2: 97 RA 1420-1510 Pt tolerated ambulation well without c/o of pain or OB. VS stable. Gait steady. Pt able to walk 550 feet.Completed pre-op education with pt and husband. They voice understanding. Pt given surgery education booklet and pt care guide. Pt declined watching gonig for heart surgery video. Pt's husband is scheduled to have his gallbladder removed removed 12/4. He is planning to call to see if he can reschedule it in a month. He will be able to provide 24/7 care for her at discharge and her daughter is also coming to stay with them starting next Tuesday. We will follow pt post-op as ordered.  Rodney Langton RN 02/24/2014 3:10 PM

## 2014-02-25 ENCOUNTER — Inpatient Hospital Stay (HOSPITAL_COMMUNITY): Payer: Medicare HMO | Admitting: Anesthesiology

## 2014-02-25 ENCOUNTER — Encounter (HOSPITAL_COMMUNITY)
Admission: EM | Disposition: A | Discharge status: Home / Self Care | Payer: Medicare HMO | Source: Home / Self Care | Attending: Cardiothoracic Surgery

## 2014-02-25 ENCOUNTER — Inpatient Hospital Stay (HOSPITAL_COMMUNITY): Payer: Medicare HMO

## 2014-02-25 ENCOUNTER — Encounter (HOSPITAL_COMMUNITY): Payer: Self-pay | Admitting: Certified Registered"

## 2014-02-25 DIAGNOSIS — I2510 Atherosclerotic heart disease of native coronary artery without angina pectoris: Secondary | ICD-10-CM | POA: Diagnosis present

## 2014-02-25 HISTORY — PX: INTRAOPERATIVE TRANSESOPHAGEAL ECHOCARDIOGRAM: SHX5062

## 2014-02-25 HISTORY — PX: CORONARY ARTERY BYPASS GRAFT: SHX141

## 2014-02-25 LAB — CREATININE, SERUM
Creatinine, Ser: 0.69 mg/dL (ref 0.50–1.10)
GFR calc Af Amer: >90 mL/min
GFR calc non Af Amer: 87 mL/min — ABNORMAL LOW

## 2014-02-25 LAB — POCT I-STAT, CHEM 8
BUN: 5 mg/dL — ABNORMAL LOW (ref 6–23)
BUN: 5 mg/dL — AB (ref 6–23)
BUN: 5 mg/dL — ABNORMAL LOW (ref 6–23)
BUN: 6 mg/dL (ref 6–23)
BUN: 6 mg/dL (ref 6–23)
BUN: 7 mg/dL (ref 6–23)
CALCIUM ION: 1.17 mmol/L (ref 1.13–1.30)
CALCIUM ION: 1.16 mmol/L (ref 1.13–1.30)
CHLORIDE: 104 meq/L (ref 96–112)
CHLORIDE: 108 meq/L (ref 96–112)
CREATININE: 0.5 mg/dL (ref 0.50–1.10)
CREATININE: 0.7 mg/dL (ref 0.50–1.10)
CREATININE: 0.7 mg/dL (ref 0.50–1.10)
CREATININE: 0.7 mg/dL (ref 0.50–1.10)
Calcium, Ion: 1.27 mmol/L (ref 1.13–1.30)
Calcium, Ion: 1.1 mmol/L — ABNORMAL LOW (ref 1.13–1.30)
Calcium, Ion: 1.27 mmol/L (ref 1.13–1.30)
Calcium, Ion: 1.22 mmol/L (ref 1.13–1.30)
Chloride: 106 mEq/L (ref 96–112)
Chloride: 106 mEq/L (ref 96–112)
Chloride: 99 mEq/L (ref 96–112)
Chloride: 102 mEq/L (ref 96–112)
Creatinine, Ser: 0.5 mg/dL (ref 0.50–1.10)
Creatinine, Ser: 0.6 mg/dL (ref 0.50–1.10)
GLUCOSE: 98 mg/dL (ref 70–99)
GLUCOSE: 142 mg/dL — AB (ref 70–99)
Glucose, Bld: 126 mg/dL — ABNORMAL HIGH (ref 70–99)
Glucose, Bld: 100 mg/dL — ABNORMAL HIGH (ref 70–99)
Glucose, Bld: 133 mg/dL — ABNORMAL HIGH (ref 70–99)
Glucose, Bld: 127 mg/dL — ABNORMAL HIGH (ref 70–99)
HCT: 29 % — ABNORMAL LOW (ref 36.0–46.0)
HCT: 23 % — ABNORMAL LOW (ref 36.0–46.0)
HCT: 26 % — ABNORMAL LOW (ref 36.0–46.0)
HCT: 24 % — ABNORMAL LOW (ref 36.0–46.0)
HEMATOCRIT: 26 % — AB (ref 36.0–46.0)
HEMATOCRIT: 39 % (ref 36.0–46.0)
HEMOGLOBIN: 9.9 g/dL — AB (ref 12.0–15.0)
HEMOGLOBIN: 8.8 g/dL — AB (ref 12.0–15.0)
HEMOGLOBIN: 13.3 g/dL (ref 12.0–15.0)
Hemoglobin: 7.8 g/dL — ABNORMAL LOW (ref 12.0–15.0)
Hemoglobin: 8.8 g/dL — ABNORMAL LOW (ref 12.0–15.0)
Hemoglobin: 8.2 g/dL — ABNORMAL LOW (ref 12.0–15.0)
POTASSIUM: 3.6 meq/L — AB (ref 3.7–5.3)
POTASSIUM: 3.6 meq/L — AB (ref 3.7–5.3)
Potassium: 3.7 mEq/L (ref 3.7–5.3)
Potassium: 4 mEq/L (ref 3.7–5.3)
Potassium: 4.2 mEq/L (ref 3.7–5.3)
Potassium: 4.5 mEq/L (ref 3.7–5.3)
SODIUM: 137 meq/L (ref 137–147)
SODIUM: 138 meq/L (ref 137–147)
Sodium: 136 mEq/L — ABNORMAL LOW (ref 137–147)
Sodium: 137 mEq/L (ref 137–147)
Sodium: 136 mEq/L — ABNORMAL LOW (ref 137–147)
Sodium: 138 mEq/L (ref 137–147)
TCO2: 22 mmol/L (ref 0–100)
TCO2: 23 mmol/L (ref 0–100)
TCO2: 23 mmol/L (ref 0–100)
TCO2: 22 mmol/L (ref 0–100)
TCO2: 22 mmol/L (ref 0–100)
TCO2: 18 mmol/L (ref 0–100)

## 2014-02-25 LAB — POCT I-STAT 3, ART BLOOD GAS (G3+)
ACID-BASE DEFICIT: 2 mmol/L (ref 0.0–2.0)
Acid-base deficit: 3 mmol/L — ABNORMAL HIGH (ref 0.0–2.0)
Acid-base deficit: 3 mmol/L — ABNORMAL HIGH (ref 0.0–2.0)
Acid-base deficit: 6 mmol/L — ABNORMAL HIGH (ref 0.0–2.0)
Acid-base deficit: 6 mmol/L — ABNORMAL HIGH (ref 0.0–2.0)
BICARBONATE: 24.2 meq/L — AB (ref 20.0–24.0)
Bicarbonate: 22.9 mEq/L (ref 20.0–24.0)
Bicarbonate: 22.5 meq/L (ref 20.0–24.0)
Bicarbonate: 19.8 meq/L — ABNORMAL LOW (ref 20.0–24.0)
Bicarbonate: 20.1 meq/L (ref 20.0–24.0)
O2 SAT: 100 %
O2 Saturation: 100 %
O2 Saturation: 96 %
O2 Saturation: 97 %
O2 Saturation: 96 %
PH ART: 7.345 — AB (ref 7.350–7.450)
PO2 ART: 280 mmHg — AB (ref 80.0–100.0)
Patient temperature: 37
Patient temperature: 36.8
Patient temperature: 37
TCO2: 26 mmol/L (ref 0–100)
TCO2: 24 mmol/L (ref 0–100)
TCO2: 24 mmol/L (ref 0–100)
TCO2: 21 mmol/L (ref 0–100)
TCO2: 21 mmol/L (ref 0–100)
pCO2 arterial: 47.1 mmHg — ABNORMAL HIGH (ref 35.0–45.0)
pCO2 arterial: 41.9 mmHg (ref 35.0–45.0)
pCO2 arterial: 42 mmHg (ref 35.0–45.0)
pCO2 arterial: 39 mmHg (ref 35.0–45.0)
pCO2 arterial: 39 mmHg (ref 35.0–45.0)
pH, Arterial: 7.318 — ABNORMAL LOW (ref 7.350–7.450)
pH, Arterial: 7.336 — ABNORMAL LOW (ref 7.350–7.450)
pH, Arterial: 7.311 — ABNORMAL LOW (ref 7.350–7.450)
pH, Arterial: 7.321 — ABNORMAL LOW (ref 7.350–7.450)
pO2, Arterial: 271 mmHg — ABNORMAL HIGH (ref 80.0–100.0)
pO2, Arterial: 84 mmHg (ref 80.0–100.0)
pO2, Arterial: 96 mmHg (ref 80.0–100.0)
pO2, Arterial: 89 mmHg (ref 80.0–100.0)

## 2014-02-25 LAB — CBC
HCT: 29.8 % — ABNORMAL LOW (ref 36.0–46.0)
HCT: 28.2 % — ABNORMAL LOW (ref 36.0–46.0)
HEMATOCRIT: 30.6 % — AB (ref 36.0–46.0)
HEMOGLOBIN: 9.7 g/dL — AB (ref 12.0–15.0)
Hemoglobin: 9.6 g/dL — ABNORMAL LOW (ref 12.0–15.0)
Hemoglobin: 8.9 g/dL — ABNORMAL LOW (ref 12.0–15.0)
MCH: 26.4 pg (ref 26.0–34.0)
MCH: 26.7 pg (ref 26.0–34.0)
MCH: 26.4 pg (ref 26.0–34.0)
MCHC: 31.7 g/dL (ref 30.0–36.0)
MCHC: 32.2 g/dL (ref 30.0–36.0)
MCHC: 31.6 g/dL (ref 30.0–36.0)
MCV: 83.4 fL (ref 78.0–100.0)
MCV: 82.8 fL (ref 78.0–100.0)
MCV: 83.7 fL (ref 78.0–100.0)
Platelets: 357 10*3/uL (ref 150–400)
Platelets: 245 10*3/uL (ref 150–400)
Platelets: 239 10*3/uL (ref 150–400)
RBC: 3.67 MIL/uL — ABNORMAL LOW (ref 3.87–5.11)
RBC: 3.6 MIL/uL — ABNORMAL LOW (ref 3.87–5.11)
RBC: 3.37 MIL/uL — ABNORMAL LOW (ref 3.87–5.11)
RDW: 14.9 % (ref 11.5–15.5)
RDW: 15 % (ref 11.5–15.5)
RDW: 15.1 % (ref 11.5–15.5)
WBC: 8.9 10*3/uL (ref 4.0–10.5)
WBC: 16.5 10*3/uL — ABNORMAL HIGH (ref 4.0–10.5)
WBC: 14.9 10*3/uL — ABNORMAL HIGH (ref 4.0–10.5)

## 2014-02-25 LAB — HEPATITIS PANEL, ACUTE
HCV Ab: NEGATIVE
Hep A IgM: NONREACTIVE
Hep B C IgM: NONREACTIVE
Hepatitis B Surface Ag: NEGATIVE

## 2014-02-25 LAB — GLUCOSE, CAPILLARY
GLUCOSE-CAPILLARY: 112 mg/dL — AB (ref 70–99)
Glucose-Capillary: 124 mg/dL — ABNORMAL HIGH (ref 70–99)
Glucose-Capillary: 149 mg/dL — ABNORMAL HIGH (ref 70–99)
Glucose-Capillary: 127 mg/dL — ABNORMAL HIGH (ref 70–99)

## 2014-02-25 LAB — BASIC METABOLIC PANEL
Anion gap: 14 (ref 5–15)
BUN: 8 mg/dL (ref 6–23)
CO2: 22 mEq/L (ref 19–32)
Calcium: 9.1 mg/dL (ref 8.4–10.5)
Chloride: 104 mEq/L (ref 96–112)
Creatinine, Ser: 0.84 mg/dL (ref 0.50–1.10)
GFR calc Af Amer: 80 mL/min — ABNORMAL LOW (ref >90)
GFR calc non Af Amer: 69 mL/min — ABNORMAL LOW (ref >90)
Glucose, Bld: 103 mg/dL — ABNORMAL HIGH (ref 70–99)
Potassium: 4 mEq/L (ref 3.7–5.3)
Sodium: 140 mEq/L (ref 137–147)

## 2014-02-25 LAB — PREPARE RBC (CROSSMATCH)

## 2014-02-25 LAB — POCT I-STAT 4, (NA,K, GLUC, HGB,HCT)
Glucose, Bld: 105 mg/dL — ABNORMAL HIGH (ref 70–99)
HCT: 30 % — ABNORMAL LOW (ref 36.0–46.0)
Hemoglobin: 10.2 g/dL — ABNORMAL LOW (ref 12.0–15.0)
Potassium: 3.8 meq/L (ref 3.7–5.3)
Sodium: 138 meq/L (ref 137–147)

## 2014-02-25 LAB — HEMOGLOBIN AND HEMATOCRIT, BLOOD
HCT: 22.5 % — ABNORMAL LOW (ref 36.0–46.0)
Hemoglobin: 7.5 g/dL — ABNORMAL LOW (ref 12.0–15.0)

## 2014-02-25 LAB — PROTIME-INR
INR: 1.34 (ref 0.00–1.49)
PROTHROMBIN TIME: 16.8 s — AB (ref 11.6–15.2)

## 2014-02-25 LAB — HEPARIN LEVEL (UNFRACTIONATED): HEPARIN UNFRACTIONATED: 0.1 [IU]/mL — AB (ref 0.30–0.70)

## 2014-02-25 LAB — MAGNESIUM: Magnesium: 3.2 mg/dL — ABNORMAL HIGH (ref 1.5–2.5)

## 2014-02-25 LAB — PLATELET COUNT: Platelets: 263 10*3/uL (ref 150–400)

## 2014-02-25 LAB — APTT: APTT: 34 s (ref 24–37)

## 2014-02-25 SURGERY — CORONARY ARTERY BYPASS GRAFTING (CABG)
Anesthesia: General | Site: Chest

## 2014-02-25 MED ORDER — METOPROLOL TARTRATE 1 MG/ML IV SOLN
2.5000 mg | INTRAVENOUS | Status: DC | PRN
Start: 2014-02-25 — End: 2014-03-02

## 2014-02-25 MED ORDER — DOCUSATE SODIUM 100 MG PO CAPS: 200.0000 mg | ORAL_CAPSULE | Freq: Every day | ORAL | Status: DC

## 2014-02-25 MED ORDER — ACETAMINOPHEN 160 MG/5ML PO SOLN: 650.0000 mg | Freq: Once | ORAL | Status: AC

## 2014-02-25 MED ORDER — SODIUM CHLORIDE 0.9 % IV SOLN
INTRAVENOUS | Status: DC
  Administered 2014-02-25: 0.9 [IU]/h via INTRAVENOUS
  Filled 2014-02-25: qty 2.5

## 2014-02-25 MED ORDER — METOPROLOL TARTRATE 25 MG/10 ML ORAL SUSPENSION
12.5000 mg | Freq: Two times a day (BID) | ORAL | Status: DC
  Filled 2014-02-25 (×11): qty 5

## 2014-02-25 MED ORDER — CHLORHEXIDINE GLUCONATE 4 % EX LIQD
60.0000 mL | Freq: Once | CUTANEOUS | Status: DC
  Filled 2014-02-25 (×2): qty 60

## 2014-02-25 MED ORDER — MIDAZOLAM HCL 5 MG/5ML IJ SOLN
INTRAMUSCULAR | Status: DC | PRN
  Administered 2014-02-25: 2 mg via INTRAVENOUS
  Administered 2014-02-25 (×2): 1 mg via INTRAVENOUS
  Administered 2014-02-25 (×2): 3 mg via INTRAVENOUS

## 2014-02-25 MED ORDER — HEMOSTATIC AGENTS (NO CHARGE) OPTIME
TOPICAL | Status: DC | PRN
  Administered 2014-02-25: 1 via TOPICAL

## 2014-02-25 MED ORDER — PANTOPRAZOLE SODIUM 40 MG PO TBEC
40.0000 mg | DELAYED_RELEASE_TABLET | Freq: Every day | ORAL | Status: DC
  Administered 2014-02-27 – 2014-03-01 (×3): 40 mg via ORAL
  Filled 2014-02-25 (×3): qty 1

## 2014-02-25 MED ORDER — NITROGLYCERIN IN D5W 200-5 MCG/ML-% IV SOLN
0.0000 ug/min | INTRAVENOUS | Status: DC
  Administered 2014-02-25: 0 ug/min via INTRAVENOUS

## 2014-02-25 MED ORDER — ACETAMINOPHEN 500 MG PO TABS
1000.0000 mg | ORAL_TABLET | Freq: Four times a day (QID) | ORAL | Status: DC
Start: 2014-02-26 — End: 2014-03-02
  Administered 2014-02-26 – 2014-03-02 (×17): 1000 mg via ORAL
  Filled 2014-02-25 (×20): qty 2

## 2014-02-25 MED ORDER — SODIUM CHLORIDE 0.9 % IV SOLN: 250.0000 mL | INTRAVENOUS | Status: DC

## 2014-02-25 MED ORDER — PROTAMINE SULFATE 10 MG/ML IV SOLN
INTRAVENOUS | Status: AC
  Filled 2014-02-25: qty 25

## 2014-02-25 MED ORDER — VECURONIUM BROMIDE 10 MG IV SOLR
INTRAVENOUS | Status: DC | PRN
  Administered 2014-02-25: 3 mg via INTRAVENOUS
  Administered 2014-02-25: 2 mg via INTRAVENOUS
  Administered 2014-02-25 (×2): 5 mg via INTRAVENOUS

## 2014-02-25 MED ORDER — TRAMADOL HCL 50 MG PO TABS: 50.0000 mg | ORAL_TABLET | ORAL | Status: DC | PRN

## 2014-02-25 MED ORDER — METOPROLOL TARTRATE 12.5 MG HALF TABLET
12.5000 mg | ORAL_TABLET | Freq: Two times a day (BID) | ORAL | Status: DC
  Administered 2014-02-26 – 2014-03-02 (×9): 12.5 mg via ORAL
  Filled 2014-02-25 (×12): qty 1

## 2014-02-25 MED ORDER — CHLORHEXIDINE GLUCONATE 4 % EX LIQD
60.0000 mL | Freq: Once | CUTANEOUS | Status: AC
  Administered 2014-02-25: 4 via TOPICAL
  Filled 2014-02-25: qty 60

## 2014-02-25 MED ORDER — HEPARIN SODIUM (PORCINE) 1000 UNIT/ML IJ SOLN
INTRAMUSCULAR | Status: AC
  Filled 2014-02-25: qty 1

## 2014-02-25 MED ORDER — EPHEDRINE SULFATE 50 MG/ML IJ SOLN
INTRAMUSCULAR | Status: AC
  Filled 2014-02-25: qty 1

## 2014-02-25 MED ORDER — VANCOMYCIN HCL IN DEXTROSE 1-5 GM/200ML-% IV SOLN
1000.0000 mg | Freq: Once | INTRAVENOUS | Status: AC
  Administered 2014-02-25: 1000 mg via INTRAVENOUS
  Filled 2014-02-25: qty 200

## 2014-02-25 MED ORDER — CHLORHEXIDINE GLUCONATE 0.12 % MT SOLN: 15.0000 mL | Freq: Two times a day (BID) | OROMUCOSAL | Status: DC

## 2014-02-25 MED ORDER — HEPARIN SODIUM (PORCINE) 1000 UNIT/ML IJ SOLN
INTRAMUSCULAR | Status: DC | PRN
  Administered 2014-02-25: 25000 [IU] via INTRAVENOUS
  Administered 2014-02-25: 2000 [IU] via INTRAVENOUS
  Administered 2014-02-25: 3000 [IU] via INTRAVENOUS

## 2014-02-25 MED ORDER — FENTANYL CITRATE 0.05 MG/ML IJ SOLN
INTRAMUSCULAR | Status: AC
  Filled 2014-02-25: qty 5

## 2014-02-25 MED ORDER — PHENYLEPHRINE 40 MCG/ML (10ML) SYRINGE FOR IV PUSH (FOR BLOOD PRESSURE SUPPORT)
PREFILLED_SYRINGE | INTRAVENOUS | Status: AC
  Filled 2014-02-25: qty 10

## 2014-02-25 MED ORDER — MIDAZOLAM HCL 2 MG/2ML IJ SOLN: 2.0000 mg | INTRAMUSCULAR | Status: DC | PRN

## 2014-02-25 MED ORDER — VECURONIUM BROMIDE 10 MG IV SOLR
INTRAVENOUS | Status: AC
  Filled 2014-02-25: qty 10

## 2014-02-25 MED ORDER — LACTATED RINGERS IV SOLN
INTRAVENOUS | Status: DC
  Administered 2014-02-25 – 2014-02-26 (×2): 20 mL/h via INTRAVENOUS

## 2014-02-25 MED ORDER — LACTATED RINGERS IV SOLN
INTRAVENOUS | Status: DC | PRN
  Administered 2014-02-25 (×4): via INTRAVENOUS

## 2014-02-25 MED ORDER — SODIUM CHLORIDE 0.9 % IJ SOLN: 3.0000 mL | INTRAMUSCULAR | Status: DC | PRN

## 2014-02-25 MED ORDER — ROCURONIUM BROMIDE 50 MG/5ML IV SOLN
INTRAVENOUS | Status: AC
  Filled 2014-02-25: qty 1

## 2014-02-25 MED ORDER — ARTIFICIAL TEARS OP OINT
TOPICAL_OINTMENT | OPHTHALMIC | Status: DC | PRN
  Administered 2014-02-25: 1 via OPHTHALMIC

## 2014-02-25 MED ORDER — SODIUM CHLORIDE 0.45 % IV SOLN
INTRAVENOUS | Status: DC
  Administered 2014-02-25: 20 mL/h via INTRAVENOUS

## 2014-02-25 MED ORDER — ACETAMINOPHEN 160 MG/5ML PO SOLN
1000.0000 mg | Freq: Four times a day (QID) | ORAL | Status: DC
Start: 2014-02-26 — End: 2014-03-02
  Administered 2014-02-26: 1000 mg
  Filled 2014-02-25: qty 40.6

## 2014-02-25 MED ORDER — SODIUM CHLORIDE 0.9 % IV SOLN
INTRAVENOUS | Status: DC
  Administered 2014-02-25: 20 mL/h via INTRAVENOUS

## 2014-02-25 MED ORDER — LACTATED RINGERS IV SOLN: 500.0000 mL | Freq: Once | INTRAVENOUS | Status: AC | PRN

## 2014-02-25 MED ORDER — BISACODYL 10 MG RE SUPP: 10.0000 mg | Freq: Every day | RECTAL | Status: DC

## 2014-02-25 MED ORDER — DEXTROSE 5 % IV SOLN
1.5000 g | Freq: Two times a day (BID) | INTRAVENOUS | Status: AC
  Administered 2014-02-25 – 2014-02-27 (×4): 1.5 g via INTRAVENOUS
  Filled 2014-02-25 (×4): qty 1.5

## 2014-02-25 MED ORDER — MORPHINE SULFATE 2 MG/ML IJ SOLN
1.0000 mg | INTRAMUSCULAR | Status: AC | PRN
  Administered 2014-02-25 (×3): 2 mg via INTRAVENOUS
  Filled 2014-02-25 (×5): qty 1

## 2014-02-25 MED ORDER — PROTAMINE SULFATE 10 MG/ML IV SOLN
INTRAVENOUS | Status: AC
  Filled 2014-02-25: qty 5

## 2014-02-25 MED ORDER — MAGNESIUM SULFATE 4 GM/100ML IV SOLN
4.0000 g | Freq: Once | INTRAVENOUS | Status: AC
  Administered 2014-02-25: 4 g via INTRAVENOUS
  Filled 2014-02-25: qty 100

## 2014-02-25 MED ORDER — BUDESONIDE-FORMOTEROL FUMARATE 160-4.5 MCG/ACT IN AERO
2.0000 | INHALATION_SPRAY | Freq: Two times a day (BID) | RESPIRATORY_TRACT | Status: DC
  Administered 2014-02-25 – 2014-03-02 (×9): 2 via RESPIRATORY_TRACT
  Filled 2014-02-25 (×2): qty 6

## 2014-02-25 MED ORDER — SODIUM CHLORIDE 0.9 % IJ SOLN
3.0000 mL | Freq: Two times a day (BID) | INTRAMUSCULAR | Status: DC
  Administered 2014-02-26 – 2014-03-01 (×4): 3 mL via INTRAVENOUS

## 2014-02-25 MED ORDER — DEXMEDETOMIDINE HCL IN NACL 200 MCG/50ML IV SOLN
0.0000 ug/kg/h | INTRAVENOUS | Status: DC
  Administered 2014-02-25: 0.5 ug/kg/h via INTRAVENOUS

## 2014-02-25 MED ORDER — POTASSIUM CHLORIDE 10 MEQ/50ML IV SOLN
10.0000 meq | INTRAVENOUS | Status: AC
  Administered 2014-02-25 (×3): 10 meq via INTRAVENOUS

## 2014-02-25 MED ORDER — ARTIFICIAL TEARS OP OINT
TOPICAL_OINTMENT | OPHTHALMIC | Status: AC
  Filled 2014-02-25: qty 3.5

## 2014-02-25 MED ORDER — EPHEDRINE SULFATE 50 MG/ML IJ SOLN
INTRAMUSCULAR | Status: DC | PRN
  Administered 2014-02-25 (×2): 5 mg via INTRAVENOUS

## 2014-02-25 MED ORDER — INSULIN REGULAR BOLUS VIA INFUSION
0.0000 [IU] | Freq: Three times a day (TID) | INTRAVENOUS | Status: DC
  Filled 2014-02-25: qty 10

## 2014-02-25 MED ORDER — CETYLPYRIDINIUM CHLORIDE 0.05 % MT LIQD
7.0000 mL | Freq: Two times a day (BID) | OROMUCOSAL | Status: DC
  Administered 2014-02-25 – 2014-03-01 (×5): 7 mL via OROMUCOSAL

## 2014-02-25 MED ORDER — LEVALBUTEROL HCL 1.25 MG/0.5ML IN NEBU
1.2500 mg | INHALATION_SOLUTION | Freq: Four times a day (QID) | RESPIRATORY_TRACT | Status: DC
  Administered 2014-02-25 – 2014-02-26 (×3): 1.25 mg via RESPIRATORY_TRACT
  Filled 2014-02-25 (×8): qty 0.5

## 2014-02-25 MED ORDER — OXYCODONE HCL 5 MG PO TABS
5.0000 mg | ORAL_TABLET | ORAL | Status: DC | PRN
  Administered 2014-02-26: 10 mg via ORAL
  Administered 2014-02-26: 5 mg via ORAL
  Administered 2014-02-26 – 2014-03-01 (×9): 10 mg via ORAL
  Administered 2014-03-02: 5 mg via ORAL
  Filled 2014-02-25 (×8): qty 2
  Filled 2014-02-25: qty 1
  Filled 2014-02-25 (×4): qty 2

## 2014-02-25 MED ORDER — FENTANYL CITRATE 0.05 MG/ML IJ SOLN
INTRAMUSCULAR | Status: DC | PRN
  Administered 2014-02-25: 100 ug via INTRAVENOUS
  Administered 2014-02-25: 250 ug via INTRAVENOUS
  Administered 2014-02-25: 1200 ug via INTRAVENOUS
  Administered 2014-02-25 (×2): 50 ug via INTRAVENOUS
  Administered 2014-02-25: 100 ug via INTRAVENOUS

## 2014-02-25 MED ORDER — PROTAMINE SULFATE 10 MG/ML IV SOLN
INTRAVENOUS | Status: DC | PRN
  Administered 2014-02-25: 350 mg via INTRAVENOUS

## 2014-02-25 MED ORDER — POTASSIUM CHLORIDE 10 MEQ/50ML IV SOLN
10.0000 meq | Freq: Once | INTRAVENOUS | Status: AC
  Administered 2014-02-25: 10 meq via INTRAVENOUS

## 2014-02-25 MED ORDER — SODIUM CHLORIDE 0.9 % IV SOLN: INTRAVENOUS | Status: DC | PRN

## 2014-02-25 MED ORDER — SODIUM CHLORIDE 0.9 % IJ SOLN
OROMUCOSAL | Status: DC | PRN
  Administered 2014-02-25: 4 mL via TOPICAL

## 2014-02-25 MED ORDER — MORPHINE SULFATE 2 MG/ML IJ SOLN
2.0000 mg | INTRAMUSCULAR | Status: DC | PRN
  Administered 2014-02-26 (×2): 4 mg via INTRAVENOUS
  Administered 2014-02-26 (×5): 2 mg via INTRAVENOUS
  Filled 2014-02-25 (×3): qty 1
  Filled 2014-02-25: qty 2
  Filled 2014-02-25 (×3): qty 1

## 2014-02-25 MED ORDER — ROCURONIUM BROMIDE 100 MG/10ML IV SOLN
INTRAVENOUS | Status: DC | PRN
  Administered 2014-02-25: 50 mg via INTRAVENOUS

## 2014-02-25 MED ORDER — CETYLPYRIDINIUM CHLORIDE 0.05 % MT LIQD
7.0000 mL | Freq: Four times a day (QID) | OROMUCOSAL | Status: DC
  Administered 2014-02-25: 7 mL via OROMUCOSAL

## 2014-02-25 MED ORDER — PHENYLEPHRINE HCL 10 MG/ML IJ SOLN
0.0000 ug/min | INTRAVENOUS | Status: DC
  Administered 2014-02-25: 0 ug/min via INTRAVENOUS
  Filled 2014-02-25 (×2): qty 2

## 2014-02-25 MED ORDER — ONDANSETRON HCL 4 MG/2ML IJ SOLN
4.0000 mg | Freq: Four times a day (QID) | INTRAMUSCULAR | Status: DC | PRN
  Administered 2014-02-26 – 2014-02-27 (×2): 4 mg via INTRAVENOUS
  Filled 2014-02-25 (×2): qty 2

## 2014-02-25 MED ORDER — LIDOCAINE HCL (CARDIAC) 20 MG/ML IV SOLN
INTRAVENOUS | Status: AC
  Filled 2014-02-25: qty 5

## 2014-02-25 MED ORDER — ACETAMINOPHEN 650 MG RE SUPP
650.0000 mg | Freq: Once | RECTAL | Status: AC
  Administered 2014-02-25: 650 mg via RECTAL

## 2014-02-25 MED ORDER — 0.9 % SODIUM CHLORIDE (POUR BTL) OPTIME
TOPICAL | Status: DC | PRN
  Administered 2014-02-25: 1000 mL

## 2014-02-25 MED ORDER — PROPOFOL 10 MG/ML IV BOLUS
INTRAVENOUS | Status: DC | PRN
  Administered 2014-02-25: 50 mg via INTRAVENOUS

## 2014-02-25 MED ORDER — MIDAZOLAM HCL 10 MG/2ML IJ SOLN
INTRAMUSCULAR | Status: AC
  Filled 2014-02-25: qty 2

## 2014-02-25 MED ORDER — FAMOTIDINE IN NACL 20-0.9 MG/50ML-% IV SOLN
20.0000 mg | Freq: Two times a day (BID) | INTRAVENOUS | Status: AC
  Administered 2014-02-25 (×2): 20 mg via INTRAVENOUS
  Filled 2014-02-25: qty 50

## 2014-02-25 MED ORDER — PROPOFOL 10 MG/ML IV BOLUS
INTRAVENOUS | Status: AC
  Filled 2014-02-25: qty 20

## 2014-02-25 MED ORDER — BISACODYL 5 MG PO TBEC: 10.0000 mg | DELAYED_RELEASE_TABLET | Freq: Every day | ORAL | Status: DC

## 2014-02-25 MED ORDER — ALBUMIN HUMAN 5 % IV SOLN
250.0000 mL | INTRAVENOUS | Status: AC | PRN
  Administered 2014-02-25 (×3): 250 mL via INTRAVENOUS
  Filled 2014-02-25: qty 250

## 2014-02-25 SURGICAL SUPPLY — 105 items
ADAPTER CARDIO PERF ANTE/RETRO (ADAPTER) ×4 IMPLANT
ADPR PRFSN 84XANTGRD RTRGD (ADAPTER) ×1
ATTRACTOMAT 16X20 MAGNETIC DRP (DRAPES) ×4 IMPLANT
BAG DECANTER FOR FLEXI CONT (MISCELLANEOUS) ×4 IMPLANT
BANDAGE ELASTIC 4 VELCRO ST LF (GAUZE/BANDAGES/DRESSINGS) ×4 IMPLANT
BANDAGE ELASTIC 6 VELCRO ST LF (GAUZE/BANDAGES/DRESSINGS) ×4 IMPLANT
BASKET HEART  (ORDER IN 25'S) (MISCELLANEOUS) ×1
BASKET HEART (ORDER IN 25'S) (MISCELLANEOUS) ×1
BASKET HEART (ORDER IN 25S) (MISCELLANEOUS) ×2 IMPLANT
BINDER BREAST XLRG (GAUZE/BANDAGES/DRESSINGS) ×4 IMPLANT
BLADE STERNUM SYSTEM 6 (BLADE) ×4 IMPLANT
BLADE SURG 11 STRL SS (BLADE) ×4 IMPLANT
BLADE SURG 12 STRL SS (BLADE) ×4 IMPLANT
BLADE SURG ROTATE 9660 (MISCELLANEOUS) IMPLANT
BNDG GAUZE ELAST 4 BULKY (GAUZE/BANDAGES/DRESSINGS) ×4 IMPLANT
CABLE PACING FASLOC BLUE (MISCELLANEOUS) ×4 IMPLANT
CANISTER SUCTION 2500CC (MISCELLANEOUS) ×4 IMPLANT
CANNULA GUNDRY RCSP 15FR (MISCELLANEOUS) ×4 IMPLANT
CATH CPB KIT VANTRIGT (MISCELLANEOUS) ×4 IMPLANT
CATH ROBINSON RED A/P 18FR (CATHETERS) ×12 IMPLANT
CATH THORACIC 36FR RT ANG (CATHETERS) ×4 IMPLANT
CLIP FOGARTY SPRING 6M (CLIP) ×4 IMPLANT
CLIP TI WIDE RED SMALL 6 (CLIP) ×8 IMPLANT
COUNTER NEEDLE 20 DBL MAG RED (NEEDLE) ×4 IMPLANT
COVER SURGICAL LIGHT HANDLE (MISCELLANEOUS) ×4 IMPLANT
CRADLE DONUT ADULT HEAD (MISCELLANEOUS) ×4 IMPLANT
DRAIN CHANNEL 32F RND 10.7 FF (WOUND CARE) ×4 IMPLANT
DRAPE CARDIOVASCULAR INCISE (DRAPES) ×4
DRAPE SLUSH/WARMER DISC (DRAPES) ×4 IMPLANT
DRAPE SRG 135X102X78XABS (DRAPES) ×2 IMPLANT
DRSG AQUACEL AG ADV 3.5X14 (GAUZE/BANDAGES/DRESSINGS) ×4 IMPLANT
ELECT BLADE 4.0 EZ CLEAN MEGAD (MISCELLANEOUS) ×4
ELECT BLADE 6.5 EXT (BLADE) ×4 IMPLANT
ELECT CAUTERY BLADE 6.4 (BLADE) ×4 IMPLANT
ELECT REM PT RETURN 9FT ADLT (ELECTROSURGICAL) ×8
ELECTRODE BLDE 4.0 EZ CLN MEGD (MISCELLANEOUS) ×2 IMPLANT
ELECTRODE REM PT RTRN 9FT ADLT (ELECTROSURGICAL) ×4 IMPLANT
GAUZE SPONGE 4X4 12PLY STRL (GAUZE/BANDAGES/DRESSINGS) ×8 IMPLANT
GLOVE BIO SURGEON STRL SZ 6 (GLOVE) ×16 IMPLANT
GLOVE BIO SURGEON STRL SZ 6.5 (GLOVE) ×6 IMPLANT
GLOVE BIO SURGEON STRL SZ7 (GLOVE) ×12 IMPLANT
GLOVE BIO SURGEON STRL SZ7.5 (GLOVE) ×12 IMPLANT
GLOVE BIO SURGEONS STRL SZ 6.5 (GLOVE) ×2
GLOVE BIOGEL PI IND STRL 6 (GLOVE) ×10 IMPLANT
GLOVE BIOGEL PI INDICATOR 6 (GLOVE) ×10
GOWN STRL REUS W/ TWL LRG LVL3 (GOWN DISPOSABLE) ×8 IMPLANT
GOWN STRL REUS W/TWL LRG LVL3 (GOWN DISPOSABLE) ×16
HEMOSTAT POWDER SURGIFOAM 1G (HEMOSTASIS) ×12 IMPLANT
HEMOSTAT SURGICEL 2X14 (HEMOSTASIS) ×4 IMPLANT
INSERT FOGARTY XLG (MISCELLANEOUS) IMPLANT
KIT BASIN OR (CUSTOM PROCEDURE TRAY) ×4 IMPLANT
KIT ROOM TURNOVER OR (KITS) ×4 IMPLANT
KIT SUCTION CATH 14FR (SUCTIONS) ×4 IMPLANT
KIT VASOVIEW W/TROCAR VH 2000 (KITS) ×4 IMPLANT
LEAD PACING MYOCARDI (MISCELLANEOUS) ×4 IMPLANT
LIQUID BAND (GAUZE/BANDAGES/DRESSINGS) ×4 IMPLANT
MARKER GRAFT CORONARY BYPASS (MISCELLANEOUS) ×12 IMPLANT
MATRIX HEMOSTAT SURGIFLO (HEMOSTASIS) ×4 IMPLANT
NS IRRIG 1000ML POUR BTL (IV SOLUTION) ×20 IMPLANT
PACK OPEN HEART (CUSTOM PROCEDURE TRAY) ×4 IMPLANT
PAD ARMBOARD 7.5X6 YLW CONV (MISCELLANEOUS) ×8 IMPLANT
PAD ELECT DEFIB RADIOL ZOLL (MISCELLANEOUS) ×4 IMPLANT
PENCIL BUTTON HOLSTER BLD 10FT (ELECTRODE) ×4 IMPLANT
PUNCH AORTIC ROT 4.0MM RCL 40 (MISCELLANEOUS) ×4 IMPLANT
PUNCH AORTIC ROTATE 4.0MM (MISCELLANEOUS) IMPLANT
PUNCH AORTIC ROTATE 4.5MM 8IN (MISCELLANEOUS) IMPLANT
PUNCH AORTIC ROTATE 5MM 8IN (MISCELLANEOUS) IMPLANT
REAMER C 12MM (INSTRUMENTS) ×4 IMPLANT
SET CARDIOPLEGIA MPS 5001102 (MISCELLANEOUS) ×4 IMPLANT
SPONGE GAUZE 4X4 12PLY STER LF (GAUZE/BANDAGES/DRESSINGS) ×4 IMPLANT
SPONGE LAP 18X18 X RAY DECT (DISPOSABLE) ×8 IMPLANT
SURGIFLO W/THROMBIN 8M KIT (HEMOSTASIS) ×4 IMPLANT
SUT BONE WAX W31G (SUTURE) ×4 IMPLANT
SUT MNCRL AB 4-0 PS2 18 (SUTURE) ×12 IMPLANT
SUT PROLENE 3 0 SH DA (SUTURE) IMPLANT
SUT PROLENE 3 0 SH1 36 (SUTURE) IMPLANT
SUT PROLENE 4 0 RB 1 (SUTURE) ×4
SUT PROLENE 4 0 SH DA (SUTURE) ×4 IMPLANT
SUT PROLENE 4-0 RB1 .5 CRCL 36 (SUTURE) ×2 IMPLANT
SUT PROLENE 5 0 C 1 36 (SUTURE) IMPLANT
SUT PROLENE 6 0 C 1 24 (SUTURE) ×4 IMPLANT
SUT PROLENE 6 0 C 1 30 (SUTURE) ×16 IMPLANT
SUT PROLENE 6 0 CC (SUTURE) ×12 IMPLANT
SUT PROLENE 8 0 BV175 6 (SUTURE) IMPLANT
SUT PROLENE BLUE 7 0 (SUTURE) ×8 IMPLANT
SUT SILK  1 MH (SUTURE)
SUT SILK 1 MH (SUTURE) IMPLANT
SUT SILK 2 0 SH CR/8 (SUTURE) IMPLANT
SUT SILK 3 0 SH CR/8 (SUTURE) ×4 IMPLANT
SUT STEEL 6MS V (SUTURE) ×8 IMPLANT
SUT STEEL SZ 6 DBL 3X14 BALL (SUTURE) ×4 IMPLANT
SUT VIC AB 1 CTX 36 (SUTURE) ×8
SUT VIC AB 1 CTX36XBRD ANBCTR (SUTURE) ×4 IMPLANT
SUT VIC AB 2-0 CT1 27 (SUTURE)
SUT VIC AB 2-0 CT1 TAPERPNT 27 (SUTURE) IMPLANT
SUT VIC AB 2-0 CTX 27 (SUTURE) IMPLANT
SUT VIC AB 3-0 X1 27 (SUTURE) ×12 IMPLANT
SUTURE E-PAK OPEN HEART (SUTURE) ×4 IMPLANT
SYSTEM SAHARA CHEST DRAIN ATS (WOUND CARE) ×4 IMPLANT
TOWEL OR 17X24 6PK STRL BLUE (TOWEL DISPOSABLE) ×8 IMPLANT
TOWEL OR 17X26 10 PK STRL BLUE (TOWEL DISPOSABLE) ×8 IMPLANT
TRAY FOLEY IC TEMP SENS 16FR (CATHETERS) ×4 IMPLANT
TUBING INSUFFLATION (TUBING) ×4 IMPLANT
UNDERPAD 30X30 INCONTINENT (UNDERPADS AND DIAPERS) ×4 IMPLANT
WATER STERILE IRR 1000ML POUR (IV SOLUTION) ×8 IMPLANT

## 2014-02-25 NOTE — Brief Op Note (Addendum)
02/22/2014 - 02/25/2014  12:04 PM  PATIENT:  Bonnie Powers  68 y.o. female  PRE-OPERATIVE DIAGNOSIS:  1. S/p NSTEMI 2.CAD  POST-OPERATIVE DIAGNOSIS: 1. S/p NSTEMI 2.CAD  PROCEDURE:INTRAOPERATIVE TRANSESOPHAGEAL ECHOCARDIOGRAM, MEDIAN STERNOTOMY for CORONARY ARTERY BYPASS GRAFTING (CABG) x3 (LIMA to LAD, SVG to DIAGONAL, SVG to RCA) with EVH from RIGHT and LEFT THIGS  SURGEON:  Surgeon(s) and Role:    * Ivin Poot, MD - Primary  PHYSICIAN ASSISTANT: Lars Pinks PA-C  ANESTHESIA:   general  EBL:  Total I/O In: 1000 [I.V.:1000] Out: 400 [Urine:400]   DRAINS: Chest tubes placed in the mediastinal and pleural spaces   COUNTS CORRECT:  YES  DICTATION: .Dragon Dictation  PLAN OF CARE: Admit to inpatient   PATIENT DISPOSITION:  ICU - intubated and hemodynamically stable.   Delay start of Pharmacological VTE agent (>24hrs) due to surgical blood loss or risk of bleeding: yes   BASELINE WEIGHT: 92 kg

## 2014-02-25 NOTE — Progress Notes (Signed)
CT surgery p.m. Rounds  Status post CABG 3 Patient awake and recently extubated Hemodynamics stable Blood sugars under good control--continue postop care

## 2014-02-25 NOTE — Transfer of Care (Signed)
Immediate Anesthesia Transfer of Care Note  Patient: Bonnie Powers  Procedure(s) Performed: Procedure(s): CORONARY ARTERY BYPASS GRAFTING (CABG) (N/A) INTRAOPERATIVE TRANSESOPHAGEAL ECHOCARDIOGRAM (N/A)  Patient Location: SICU  Anesthesia Type:General  Level of Consciousness: sedated, unresponsive and Patient remains intubated per anesthesia plan  Airway & Oxygen Therapy: Patient remains intubated per anesthesia plan and Patient placed on Ventilator (see vital sign flow sheet for setting)  Post-op Assessment: Report given to PACU RN and Post -op Vital signs reviewed and stable  Post vital signs: Reviewed and stable  Complications: No apparent anesthesia complications

## 2014-02-25 NOTE — OR Nursing (Signed)
Chest incision 0902

## 2014-02-25 NOTE — Anesthesia Procedure Notes (Addendum)
Procedure Name: Intubation Date/Time: 02/25/2014 7:53 AM Performed by: Gaylene Brooks Pre-anesthesia Checklist: Timeout performed, Patient identified, Emergency Drugs available, Suction available and Patient being monitored Patient Re-evaluated:Patient Re-evaluated prior to inductionOxygen Delivery Method: Circle system utilized Preoxygenation: Pre-oxygenation with 100% oxygen Intubation Type: IV induction Ventilation: Mask ventilation without difficulty Laryngoscope Size: Miller and 2 Grade View: Grade III Tube type: Oral Tube size: 8.0 mm Number of attempts: 2 Airway Equipment and Method: Bougie stylet and Stylet Placement Confirmation: ETT inserted through vocal cords under direct vision,  breath sounds checked- equal and bilateral and positive ETCO2 Secured at: 22 cm Tube secured with: Tape Dental Injury: Teeth and Oropharynx as per pre-operative assessment  Difficulty Due To: Difficult Airway- due to dentition and Difficult Airway- due to anterior larynx

## 2014-02-25 NOTE — Plan of Care (Signed)
Problem: Consults Goal: Cardiac Surgery Patient Education ( See Patient Education module for education specifics.) Outcome: Completed/Met Date Met:  02/25/14  Problem: Phase II - Intermediate Post-Op Goal: Wean to Extubate Outcome: Completed/Met Date Met:  02/25/14 Pt able to meet criteria for Rapid wean after 5 hrs, extubated to Moodus after 7 hrs without difficulty after all parameters met. Goal: Maintain Hemodynamic Stability Outcome: Completed/Met Date Met:  02/25/14 Goal: CBGs/Blood Glucose per SCIP Criteria Outcome: Completed/Met Date Met:  02/25/14 Glucose stabilizer and insulin drip in use with CBG's ranging from 94-134.

## 2014-02-25 NOTE — Procedures (Signed)
Extubation Procedure Note  Patient Details:   Name: ARITHA HUCKEBA DOB: 1945-10-23 MRN: [id number]   Airway Documentation:  Airway 8 mm (Active)  Secured at (cm) 22 cm 02/25/2014  6:42 PM  Measured From Lips 02/25/2014  8:00 PM  Secured Location Right 02/25/2014  6:42 PM  Secured By Pink Tape 02/25/2014  6:42 PM  Site Condition Dry 02/25/2014  8:00 PM    Evaluation  O2 sats: stable throughout Complications: No apparent complications Patient did tolerate procedure well. Bilateral Breath Sounds: Diminished, Rhonchi   Yes, pt able to vocalize and cough to clear secretions with cuff leak. Extubated pt to 5LPM nasal cannula. Pt tolerating well at this time.   Virgilio Frees 02/25/2014, 9:05 PM

## 2014-02-25 NOTE — OR Nursing (Signed)
45 min call made to SICU nurse

## 2014-02-25 NOTE — Anesthesia Preprocedure Evaluation (Addendum)
Anesthesia Evaluation  Patient identified by MRN, date of birth, ID band Patient awake    Reviewed: Allergy & Precautions, H&P , NPO status , Patient's Chart, lab work & pertinent test results  Airway Mallampati: I  TM Distance: >3 FB Neck ROM: Full    Dental  (+) Dental Advisory Given, Chipped, Poor Dentition,    Pulmonary Current Smoker,          Cardiovascular hypertension, Pt. on medications     Neuro/Psych Anxiety Depression    GI/Hepatic   Endo/Other    Renal/GU      Musculoskeletal   Abdominal   Peds  Hematology   Anesthesia Other Findings   Reproductive/Obstetrics                            Anesthesia Physical Anesthesia Plan  ASA: III  Anesthesia Plan: General   Post-op Pain Management:    Induction: Intravenous  Airway Management Planned: Oral ETT  Additional Equipment: Arterial line, CVP, PA Cath, 3D TEE and Ultrasound Guidance Line Placement  Intra-op Plan:   Post-operative Plan: Extubation in OR  Informed Consent: I have reviewed the patients History and Physical, chart, labs and discussed the procedure including the risks, benefits and alternatives for the proposed anesthesia with the patient or authorized representative who has indicated his/her understanding and acceptance.     Plan Discussed with: CRNA and Surgeon  Anesthesia Plan Comments:         Anesthesia Quick Evaluation

## 2014-02-25 NOTE — Anesthesia Postprocedure Evaluation (Signed)
Anesthesia Post Note  Patient: Bonnie Powers  Procedure(s) Performed: Procedure(s) (LRB): CORONARY ARTERY BYPASS GRAFTING (CABG) (N/A) INTRAOPERATIVE TRANSESOPHAGEAL ECHOCARDIOGRAM (N/A)  Anesthesia type: General  Patient location: ICU  Post pain: Pain level controlled  Post assessment: Post-op Vital signs reviewed  Last Vitals:  Filed Vitals:   02/25/14 1530  BP: 94/67  Pulse: 90  Temp: 37 C  Resp: 20    Post vital signs: stable  Level of consciousness: Patient remains intubated per anesthesia plan  Complications: No apparent anesthesia complications

## 2014-02-25 NOTE — Progress Notes (Signed)
  Echocardiogram Echocardiogram Transesophageal has been performed.  Chauna Osoria FRANCES 02/25/2014, 9:05 AM

## 2014-02-25 NOTE — Progress Notes (Signed)
The patient was examined and preop studies reviewed. There has been no change from the prior exam and the patient is ready for surgery.   Plan CABG today on C Sellen

## 2014-02-26 ENCOUNTER — Inpatient Hospital Stay (HOSPITAL_COMMUNITY): Payer: Medicare HMO

## 2014-02-26 LAB — GLUCOSE, CAPILLARY
GLUCOSE-CAPILLARY: 106 mg/dL — AB (ref 70–99)
GLUCOSE-CAPILLARY: 111 mg/dL — AB (ref 70–99)
GLUCOSE-CAPILLARY: 114 mg/dL — AB (ref 70–99)
GLUCOSE-CAPILLARY: 120 mg/dL — AB (ref 70–99)
GLUCOSE-CAPILLARY: 121 mg/dL — AB (ref 70–99)
Glucose-Capillary: 130 mg/dL — ABNORMAL HIGH (ref 70–99)
Glucose-Capillary: 113 mg/dL — ABNORMAL HIGH (ref 70–99)
Glucose-Capillary: 125 mg/dL — ABNORMAL HIGH (ref 70–99)
Glucose-Capillary: 116 mg/dL — ABNORMAL HIGH (ref 70–99)
Glucose-Capillary: 127 mg/dL — ABNORMAL HIGH (ref 70–99)
Glucose-Capillary: 108 mg/dL — ABNORMAL HIGH (ref 70–99)
Glucose-Capillary: 110 mg/dL — ABNORMAL HIGH (ref 70–99)
Glucose-Capillary: 105 mg/dL — ABNORMAL HIGH (ref 70–99)
Glucose-Capillary: 117 mg/dL — ABNORMAL HIGH (ref 70–99)
Glucose-Capillary: 138 mg/dL — ABNORMAL HIGH (ref 70–99)
Glucose-Capillary: 130 mg/dL — ABNORMAL HIGH (ref 70–99)

## 2014-02-26 LAB — POCT I-STAT 3, ART BLOOD GAS (G3+)
Acid-base deficit: 4 mmol/L — ABNORMAL HIGH (ref 0.0–2.0)
BICARBONATE: 21.4 meq/L (ref 20.0–24.0)
O2 Saturation: 97 %
PH ART: 7.349 — AB (ref 7.350–7.450)
Patient temperature: 36.8
TCO2: 23 mmol/L (ref 0–100)
pCO2 arterial: 38.8 mmHg (ref 35.0–45.0)
pO2, Arterial: 98 mmHg (ref 80.0–100.0)

## 2014-02-26 LAB — CBC
HCT: 28.9 % — ABNORMAL LOW (ref 36.0–46.0)
HEMATOCRIT: 27 % — AB (ref 36.0–46.0)
HEMOGLOBIN: 8.6 g/dL — AB (ref 12.0–15.0)
Hemoglobin: 9.1 g/dL — ABNORMAL LOW (ref 12.0–15.0)
MCH: 26.7 pg (ref 26.0–34.0)
MCH: 26.7 pg (ref 26.0–34.0)
MCHC: 31.9 g/dL (ref 30.0–36.0)
MCHC: 31.5 g/dL (ref 30.0–36.0)
MCV: 83.9 fL (ref 78.0–100.0)
MCV: 84.8 fL (ref 78.0–100.0)
Platelets: 227 10*3/uL (ref 150–400)
Platelets: 227 10*3/uL (ref 150–400)
RBC: 3.22 MIL/uL — AB (ref 3.87–5.11)
RBC: 3.41 MIL/uL — AB (ref 3.87–5.11)
RDW: 15.2 % (ref 11.5–15.5)
RDW: 15.5 % (ref 11.5–15.5)
WBC: 11.5 10*3/uL — ABNORMAL HIGH (ref 4.0–10.5)
WBC: 11.9 10*3/uL — AB (ref 4.0–10.5)

## 2014-02-26 LAB — BASIC METABOLIC PANEL
ANION GAP: 13 (ref 5–15)
Anion gap: 17 — ABNORMAL HIGH (ref 5–15)
BUN: 8 mg/dL (ref 6–23)
BUN: 8 mg/dL (ref 6–23)
CHLORIDE: 107 meq/L (ref 96–112)
CO2: 20 mEq/L (ref 19–32)
CO2: 19 mEq/L (ref 19–32)
CREATININE: 0.69 mg/dL (ref 0.50–1.10)
Calcium: 8.1 mg/dL — ABNORMAL LOW (ref 8.4–10.5)
Calcium: 8.3 mg/dL — ABNORMAL LOW (ref 8.4–10.5)
Chloride: 101 mEq/L (ref 96–112)
Creatinine, Ser: 0.71 mg/dL (ref 0.50–1.10)
GFR calc Af Amer: >90 mL/min (ref >90)
GFR calc Af Amer: >90 mL/min (ref >90)
GFR calc non Af Amer: 87 mL/min — ABNORMAL LOW (ref >90)
GFR calc non Af Amer: 86 mL/min — ABNORMAL LOW (ref >90)
GLUCOSE: 110 mg/dL — AB (ref 70–99)
Glucose, Bld: 132 mg/dL — ABNORMAL HIGH (ref 70–99)
Potassium: 4.3 mEq/L (ref 3.7–5.3)
Potassium: 4.5 mEq/L (ref 3.7–5.3)
Sodium: 140 mEq/L (ref 137–147)
Sodium: 137 mEq/L (ref 137–147)

## 2014-02-26 LAB — HEMOGLOBIN A1C
Hgb A1c MFr Bld: 6.5 % — ABNORMAL HIGH (ref <5.7)
Mean Plasma Glucose: 140 mg/dL — ABNORMAL HIGH (ref <117)

## 2014-02-26 LAB — MAGNESIUM
Magnesium: 2.7 mg/dL — ABNORMAL HIGH (ref 1.5–2.5)
Magnesium: 2.3 mg/dL (ref 1.5–2.5)

## 2014-02-26 MED ORDER — LEVALBUTEROL HCL 1.25 MG/0.5ML IN NEBU
1.2500 mg | INHALATION_SOLUTION | Freq: Four times a day (QID) | RESPIRATORY_TRACT | Status: DC | PRN
  Filled 2014-02-26: qty 0.5

## 2014-02-26 MED ORDER — INSULIN ASPART 100 UNIT/ML ~~LOC~~ SOLN: 0.0000 [IU] | SUBCUTANEOUS | Status: DC

## 2014-02-26 MED ORDER — INSULIN ASPART 100 UNIT/ML ~~LOC~~ SOLN
0.0000 [IU] | SUBCUTANEOUS | Status: DC
  Administered 2014-02-26 – 2014-02-27 (×3): 2 [IU] via SUBCUTANEOUS

## 2014-02-26 MED ORDER — SODIUM CHLORIDE 0.9 % IV SOLN: INTRAVENOUS | Status: DC | PRN

## 2014-02-26 MED ORDER — FUROSEMIDE 10 MG/ML IJ SOLN
20.0000 mg | Freq: Two times a day (BID) | INTRAMUSCULAR | Status: DC
  Administered 2014-02-26 – 2014-02-27 (×2): 20 mg via INTRAVENOUS
  Filled 2014-02-26 (×3): qty 2

## 2014-02-26 MED ORDER — FUROSEMIDE 10 MG/ML IJ SOLN
20.0000 mg | Freq: Every day | INTRAMUSCULAR | Status: DC
  Administered 2014-02-26: 20 mg via INTRAVENOUS
  Filled 2014-02-26: qty 2

## 2014-02-26 NOTE — Op Note (Signed)
NAMEARASELY, AKKERMAN              ACCOUNT NO.:  [id number]  MEDICAL RECORD NO.:  [id number]  LOCATION:  2S09C                        FACILITY:  [facility]  PHYSICIAN:  Ivin Poot, M.D.  DATE OF BIRTH:  Aug 14, 1945  DATE OF PROCEDURE:  02/25/2014 DATE OF DISCHARGE:                              OPERATIVE REPORT   OPERATION: 1. Coronary artery bypass grafting x3 (left internal mammary artery to     LAD, saphenous vein graft to diagonal, saphenous vein graft to     right coronary artery). 2. Endoscopic harvest of left leg greater saphenous vein.  SURGEON:  Ivin Poot, M.D.  ASSISTANT:  Lars Pinks, PA-C  ANESTHESIA:  General by Crissie Sickles. Conrad Denver, M.D.  PREOPERATIVE DIAGNOSIS:  Severe multivessel coronary artery disease with unstable angina.  POSTOPERATIVE DIAGNOSIS:  Severe multivessel coronary artery disease with unstable angina.  CLINICAL NOTE:  The patient is a 68 year old obese female smoker with chronic ulcerative colitis.  She was admitted to the hospital with symptoms of unstable angina.  On presentation, she has nonspecific ST- segment changes.  However, her cardiac enzymes were mildly positive. She was taken to the cath lab for definition of her coronary anatomy. She was found to have a high-grade 95% stenosis to the LAD diagonal, high-grade 95% stenosis of the proximal to mid RCA, and no significant disease of the circumflex.  LV function was fairly well preserved.  A preoperative echocardiogram showed mild aortic insufficiency with preserved LV systolic function.  She was felt to be candidate for surgical coronary revascularization as it was felt the patient would not tolerate dual anti-platelet therapy with her ulcerative colitis due to the risk of recurrent GI bleeding.  Prior to surgery, I discussed the procedure of CABG with the patient and her family.  I discussed the major aspects of the operation including the use of general anesthesia and  cardiopulmonary bypass, the location of the surgical incisions, and the expected postoperative hospital recovery.  I discussed with her the major risks to her of the operation including risks of stroke, MI, bleeding requiring blood transfusion, postoperative infection, postoperative ventilator dependence due to her COPD, postoperative pleural effusions, arrhythmia, and death.  After reviewing these issues, she demonstrated her understanding and agreed to proceed with surgery under what I felt was an informed consent.  FINDINGS: 1. Obese body habitus with difficult exposure of the posterior aspect     of the heart. 2. Severe coronary disease with heavy calcification of the right and     LAD. 3. Adequate conduit. 4. No blood products were required for the operation.  OPERATIVE PROCEDURE:  The patient was brought to the operating room and placed supine on the operating table.  General anesthesia was induced under invasive hemodynamic monitoring.  The chest, abdomen, and legs were prepped with Betadine and draped as a sterile field.  A proper time- out was performed.  A sternotomy incision was made as the saphenous vein was harvested from the left leg.  The vein was examined and exposed on the right leg, but was found to be too small.  Left internal mammary artery was harvested as a pedicle graft from its origin at the subclavian vessels.  It was 1.4-mm vessel with good flow. The sternal retractor was placed using the deep blades due to the patient's short obese body habitus.  The pericardium was opened and suspended.  Pursestrings were placed in the ascending aorta and right atrium and heparin was administered.  After the vein had been harvested and the ACT was documented as being therapeutic, the patient was cannulated and placed on cardiopulmonary bypass.  The coronary arteries were identified for grafting, and the mammary artery and vein grafts were prepared for the distal  anastomoses.  Cardioplegia cannulas were placed both antegrade and retrograde cold blood cardioplegia, and the patient was cooled to 32 degrees.  The aortic crossclamp was applied.  A 1 L of cold blood cardioplegia was delivered between the aortic and coronary sinus catheters.  Cardioplegic arrest was successfully achieved, and there was minimal evidence of aortic insufficiency during administration of the aortic cardioplegia.  Septal temperature dropped less than 12 degrees.  The distal coronary anastomoses were then performed.  The first distal anastomosis was to the distal RCA.  This was a 1.5 mm to 1.75-mm vessel with a thickened wall and a high-grade 95% proximal to mid vessel stenosis.  A reverse saphenous vein was sewn end-to-side with running 7- 0 Prolene with good flow through the graft.  Cardioplegia was redosed.  The second distal anastomosis was to the diagonal branch of the LAD. This was a 1.5-mm vessel, proximal ostial 80% to 90% stenosis.  A reverse saphenous vein was sewn end-to-side with running 7-0 Prolene. There was good flow through the graft.  Cardioplegia was redosed.  The third distal anastomosis was the mid portion of the LAD.  It had a proximal 95% stenosis.  The left IMA pedicle was brought through an opening in the left lateral pericardium, was brought down onto the LAD and sewn end-to-side with running 8-0 Prolene.  There was excellent flow through the anastomosis after briefly releasing the pedicle bulldog on the mammary artery.  The bulldog was reapplied, and the pedicle was secured to the epicardium with 6-0 Prolene.  Cardioplegia was redosed.  The crossclamp was still in place, 2 proximal vein anastomoses were performed on the ascending aorta with a 4.0 mm punch running 6-0 Prolene.  Prior to tying down the final proximal anastomosis, the air was vented from the coronaries with a dose of retrograde warm blood cardioplegia.  The crossclamp was then  removed.  The heart resumed a spontaneous rhythm.  Air was vented from the coronaries and the coronaries were opened, each had good flow.  Hemostasis was documented at the proximal distal anastomoses.  The cardioplegia cannulas were removed.  The patient was rewarmed and reperfused.  Temporary pacing wires were applied.  The lungs re-expanded and the ventilator was resumed.  When the patient was adequately reperfused, she was weaned from cardiopulmonary bypass without needing inotropes.  Cardiac output and hemodynamics were stable. Echocardiogram showed preserved LV systolic function.  She remained to have mild aortic insufficiency.  Protamine was administered without adverse reaction.  The cannula was removed.  The mediastinum was irrigated with warm saline.  The superior pericardial fat was closed. Anterior mediastinal and left pleural chest tube were placed and brought out through separate incisions.  The sternum was closed with interrupted steel wire.  The pectoralis fascia and subcutaneous layers were closed in running Vicryl.  The skin was closed with a subcuticular.  Sterile dressings were applied.  Total cardiopulmonary bypass time was 110 minutes.     Collier Salina  Prescott Gum, M.D.     PV/MEDQ  D:  02/25/2014  T:  02/26/2014  Job:  [id number]  cc:   Loralie Champagne, MD

## 2014-02-26 NOTE — Progress Notes (Signed)
1 Day Post-Op Procedure(s) (LRB): CORONARY ARTERY BYPASS GRAFTING (CABG) (N/A) INTRAOPERATIVE TRANSESOPHAGEAL ECHOCARDIOGRAM (N/A) Subjective: Good progression today nsr No abdominal pain  Objective: Vital signs in last 24 hours: Temp:  [98 F (36.7 C)-98.8 F (37.1 C)] 98 F (36.7 C) (11/21 1200) Pulse Rate:  [75-90] 78 (11/21 1900) Cardiac Rhythm:  [-] Normal sinus rhythm (11/21 2000) Resp:  [17-31] 31 (11/21 1900) BP: (98-126)/(34-63) 101/38 mmHg (11/21 2000) SpO2:  [92 %-99 %] 94 % (11/21 1936) Arterial Line BP: (117-159)/(52-58) 132/58 mmHg (11/21 1000) Weight:  [213 lb 3 oz (96.7 kg)] 213 lb 3 oz (96.7 kg) (11/21 0600)  Hemodynamic parameters for last 24 hours: PAP: (25-37)/(10-19) 34/19 mmHg CO:  [6.4 L/min-6.8 L/min] 6.8 L/min CI:  [3.2 L/min/m2-3.4 L/min/m2] 3.4 L/min/m2  Intake/Output from previous day: 11/20 0701 - 11/21 0700 In: 6165.1 [P.O.:360; I.V.:3795.1; Blood:570; NG/GT:30; IV Piggyback:1410] Out: 4469 [Urine:1890; Blood:1525; Chest Tube:280] Intake/Output this shift: Total I/O In: 260 [P.O.:240; I.V.:20] Out: 75 [Urine:75]  abd soft extrem warm  Lab Results:  Recent Labs  02/26/14 0430 02/26/14 1709  WBC 11.5* 11.9*  HGB 8.6* 9.1*  HCT 27.0* 28.9*  PLT 227 227   BMET:  Recent Labs  02/26/14 0430 02/26/14 1709  NA 140 137  K 4.3 4.5  CL 107 101  CO2 20 19  GLUCOSE 110* 132*  BUN 8 8  CREATININE 0.69 0.71  CALCIUM 8.1* 8.3*    PT/INR:  Recent Labs  02/25/14 1355  LABPROT 16.8*  INR 1.34   ABG    Component Value Date/Time   PHART 7.349* 02/26/2014 0412   HCO3 21.4 02/26/2014 0412   TCO2 23 02/26/2014 0412   ACIDBASEDEF 4.0* 02/26/2014 0412   O2SAT 97.0 02/26/2014 0412   CBG (last 3)   Recent Labs  02/26/14 1146 02/26/14 1619 02/26/14 2000  GLUCAP 120* 138* 130*    Assessment/Plan: S/P Procedure(s) (LRB): CORONARY ARTERY BYPASS GRAFTING (CABG) (N/A) INTRAOPERATIVE TRANSESOPHAGEAL ECHOCARDIOGRAM (N/A) See  progression orders   LOS: 4 days    VAN TRIGT III,PETER 02/26/2014

## 2014-02-27 ENCOUNTER — Inpatient Hospital Stay (HOSPITAL_COMMUNITY): Payer: Medicare HMO

## 2014-02-27 ENCOUNTER — Encounter (HOSPITAL_COMMUNITY): Payer: Self-pay | Admitting: *Deleted

## 2014-02-27 LAB — CBC
HCT: 26.6 % — ABNORMAL LOW (ref 36.0–46.0)
Hemoglobin: 8.5 g/dL — ABNORMAL LOW (ref 12.0–15.0)
MCH: 27 pg (ref 26.0–34.0)
MCHC: 32 g/dL (ref 30.0–36.0)
MCV: 84.4 fL (ref 78.0–100.0)
Platelets: 234 10*3/uL (ref 150–400)
RBC: 3.15 MIL/uL — ABNORMAL LOW (ref 3.87–5.11)
RDW: 15.5 % (ref 11.5–15.5)
WBC: 10.9 10*3/uL — ABNORMAL HIGH (ref 4.0–10.5)

## 2014-02-27 LAB — GLUCOSE, CAPILLARY
GLUCOSE-CAPILLARY: 142 mg/dL — AB (ref 70–99)
Glucose-Capillary: 102 mg/dL — ABNORMAL HIGH (ref 70–99)
Glucose-Capillary: 117 mg/dL — ABNORMAL HIGH (ref 70–99)
Glucose-Capillary: 126 mg/dL — ABNORMAL HIGH (ref 70–99)
Glucose-Capillary: 188 mg/dL — ABNORMAL HIGH (ref 70–99)

## 2014-02-27 LAB — BASIC METABOLIC PANEL
Anion gap: 15 (ref 5–15)
BUN: 9 mg/dL (ref 6–23)
CO2: 24 mEq/L (ref 19–32)
Calcium: 8.5 mg/dL (ref 8.4–10.5)
Chloride: 96 mEq/L (ref 96–112)
Creatinine, Ser: 0.81 mg/dL (ref 0.50–1.10)
GFR calc Af Amer: 84 mL/min — ABNORMAL LOW (ref >90)
GFR calc non Af Amer: 72 mL/min — ABNORMAL LOW (ref >90)
Glucose, Bld: 115 mg/dL — ABNORMAL HIGH (ref 70–99)
Potassium: 4.3 mEq/L (ref 3.7–5.3)
Sodium: 135 mEq/L — ABNORMAL LOW (ref 137–147)

## 2014-02-27 MED ORDER — INSULIN ASPART 100 UNIT/ML ~~LOC~~ SOLN
0.0000 [IU] | Freq: Three times a day (TID) | SUBCUTANEOUS | Status: DC
  Administered 2014-02-27: 2 [IU] via SUBCUTANEOUS
  Administered 2014-02-27: 4 [IU] via SUBCUTANEOUS
  Administered 2014-02-28: 2 [IU] via SUBCUTANEOUS
  Administered 2014-02-28: 4 [IU] via SUBCUTANEOUS
  Administered 2014-02-28 – 2014-03-01 (×5): 2 [IU] via SUBCUTANEOUS

## 2014-02-27 MED ORDER — FUROSEMIDE 40 MG PO TABS
40.0000 mg | ORAL_TABLET | Freq: Every day | ORAL | Status: DC
  Administered 2014-02-28 – 2014-03-02 (×3): 40 mg via ORAL
  Filled 2014-02-27 (×3): qty 1

## 2014-02-27 MED ORDER — DICYCLOMINE HCL 10 MG PO CAPS: 10.0000 mg | ORAL_CAPSULE | Freq: Three times a day (TID) | ORAL | Status: DC

## 2014-02-27 MED ORDER — FUROSEMIDE 10 MG/ML IJ SOLN
20.0000 mg | Freq: Two times a day (BID) | INTRAMUSCULAR | Status: AC
  Administered 2014-02-27: 20 mg via INTRAVENOUS

## 2014-02-27 MED ORDER — FERROUS SULFATE 325 (65 FE) MG PO TABS
325.0000 mg | ORAL_TABLET | Freq: Two times a day (BID) | ORAL | Status: DC
  Administered 2014-02-27 – 2014-03-02 (×7): 325 mg via ORAL
  Filled 2014-02-27 (×9): qty 1

## 2014-02-27 MED ORDER — SODIUM CHLORIDE 0.9 % IJ SOLN: 3.0000 mL | INTRAMUSCULAR | Status: DC | PRN

## 2014-02-27 MED ORDER — SODIUM CHLORIDE 0.9 % IJ SOLN
3.0000 mL | Freq: Two times a day (BID) | INTRAMUSCULAR | Status: DC
  Administered 2014-02-27 – 2014-02-28 (×2): 3 mL via INTRAVENOUS

## 2014-02-27 MED ORDER — METHYLPREDNISOLONE SODIUM SUCC 40 MG IJ SOLR
40.0000 mg | Freq: Every day | INTRAMUSCULAR | Status: AC
  Administered 2014-02-27 – 2014-03-01 (×3): 40 mg via INTRAVENOUS
  Filled 2014-02-27 (×3): qty 1

## 2014-02-27 MED ORDER — POTASSIUM CHLORIDE CRYS ER 20 MEQ PO TBCR
20.0000 meq | EXTENDED_RELEASE_TABLET | Freq: Every day | ORAL | Status: DC
  Administered 2014-02-27 – 2014-03-02 (×4): 20 meq via ORAL
  Filled 2014-02-27 (×4): qty 1

## 2014-02-27 MED ORDER — MOVING RIGHT ALONG BOOK
Freq: Once | Status: AC
  Administered 2014-02-27: 18:00:00
  Filled 2014-02-27: qty 1

## 2014-02-27 MED ORDER — SODIUM CHLORIDE 0.9 % IV SOLN: 250.0000 mL | INTRAVENOUS | Status: DC | PRN

## 2014-02-27 MED ORDER — MESALAMINE 400 MG PO CPDR
800.0000 mg | DELAYED_RELEASE_CAPSULE | Freq: Three times a day (TID) | ORAL | Status: DC
  Administered 2014-02-27 – 2014-03-02 (×11): 800 mg via ORAL
  Filled 2014-02-27 (×14): qty 2

## 2014-02-27 NOTE — Progress Notes (Addendum)
2 Days Post-Op Procedure(s) (LRB): CORONARY ARTERY BYPASS GRAFTING (CABG) (N/A) INTRAOPERATIVE TRANSESOPHAGEAL ECHOCARDIOGRAM (N/A) Subjective: Ambulating well Maintaining sinus rhythm Having loose stools which is normal for her ulcerative colitis Chest x-ray clear  Objective: Vital signs in last 24 hours: Temp:  [98 F (36.7 C)-99.3 F (37.4 C)] 98.2 F (36.8 C) (11/22 0802) Pulse Rate:  [75-88] 80 (11/22 0802) Cardiac Rhythm:  [-] Normal sinus rhythm (11/21 2000) Resp:  [19-33] 27 (11/22 0802) BP: (97-126)/(31-63) 112/46 mmHg (11/22 0802) SpO2:  [92 %-100 %] 100 % (11/22 0914) Weight:  [209 lb 10.5 oz (95.1 kg)] 209 lb 10.5 oz (95.1 kg) (11/22 0700)  Hemodynamic parameters for last 24 hours:   afebrile sinus rhythm  Intake/Output from previous day: 11/21 0701 - 11/22 0700 In: 1761 [P.O.:835; I.V.:513; IV Piggyback:100] Out: 2755 [Urine:2705; Chest Tube:50] Intake/Output this shift:    Exam Lungs clear Incisions clean Extremities warm Abdomen soft  Lab Results:  Recent Labs  02/26/14 1709 02/27/14 0510  WBC 11.9* 10.9*  HGB 9.1* 8.5*  HCT 28.9* 26.6*  PLT 227 234   BMET:  Recent Labs  02/26/14 1709 02/27/14 0510  NA 137 135*  K 4.5 4.3  CL 101 96  CO2 19 24  GLUCOSE 132* 115*  BUN 8 9  CREATININE 0.71 0.81  CALCIUM 8.3* 8.5    PT/INR:  Recent Labs  02/25/14 1355  LABPROT 16.8*  INR 1.34   ABG    Component Value Date/Time   PHART 7.349* 02/26/2014 0412   HCO3 21.4 02/26/2014 0412   TCO2 23 02/26/2014 0412   ACIDBASEDEF 4.0* 02/26/2014 0412   O2SAT 97.0 02/26/2014 0412   CBG (last 3)   Recent Labs  02/26/14 2000 02/27/14 0018 02/27/14 0801  GLUCAP 130* 117* 126*    Assessment/Plan: S/P Procedure(s) (LRB): CORONARY ARTERY BYPASS GRAFTING (CABG) (N/A) INTRAOPERATIVE TRANSESOPHAGEAL ECHOCARDIOGRAM (N/A) Plan for transfer to step-down: see transfer orders Daily dose of iv solumedrol for fare up [place]  LOS: 5 days    VAN TRIGT  III,PETER 02/27/2014

## 2014-02-27 NOTE — Progress Notes (Signed)
Pt refused to ambulate tonight.  Education reinforced.  Pt resting in bed with call bell in reach.  Will continue to monitor.

## 2014-02-27 NOTE — Plan of Care (Signed)
Problem: Phase I Progression Outcomes Goal: Hemodynamically stable Outcome: Adequate for Discharge

## 2014-02-27 NOTE — Plan of Care (Signed)
Problem: Phase II - Intermediate Post-Op Goal: Activity Progressed Outcome: Completed/Met Date Met:  02/27/14 OOB to chair with assistance, ambulated 200' with assistance without difficulties.

## 2014-02-28 ENCOUNTER — Inpatient Hospital Stay (HOSPITAL_COMMUNITY): Payer: Medicare HMO

## 2014-02-28 LAB — TYPE AND SCREEN
ABO/RH(D): A POS
Antibody Screen: NEGATIVE
Unit division: 0
Unit division: 0
Unit division: 0
Unit division: 0

## 2014-02-28 LAB — CBC
HCT: 26.5 % — ABNORMAL LOW (ref 36.0–46.0)
Hemoglobin: 8.4 g/dL — ABNORMAL LOW (ref 12.0–15.0)
MCH: 26.4 pg (ref 26.0–34.0)
MCHC: 31.7 g/dL (ref 30.0–36.0)
MCV: 83.3 fL (ref 78.0–100.0)
Platelets: 285 10*3/uL (ref 150–400)
RBC: 3.18 MIL/uL — ABNORMAL LOW (ref 3.87–5.11)
RDW: 15.3 % (ref 11.5–15.5)
WBC: 11 10*3/uL — ABNORMAL HIGH (ref 4.0–10.5)

## 2014-02-28 LAB — BASIC METABOLIC PANEL
Anion gap: 14 (ref 5–15)
BUN: 13 mg/dL (ref 6–23)
CO2: 24 mEq/L (ref 19–32)
Calcium: 8.8 mg/dL (ref 8.4–10.5)
Chloride: 97 mEq/L (ref 96–112)
Creatinine, Ser: 0.76 mg/dL (ref 0.50–1.10)
GFR calc Af Amer: >90 mL/min (ref >90)
GFR calc non Af Amer: 84 mL/min — ABNORMAL LOW (ref >90)
Glucose, Bld: 126 mg/dL — ABNORMAL HIGH (ref 70–99)
Potassium: 4.5 mEq/L (ref 3.7–5.3)
Sodium: 135 mEq/L — ABNORMAL LOW (ref 137–147)

## 2014-02-28 LAB — GLUCOSE, CAPILLARY
GLUCOSE-CAPILLARY: 132 mg/dL — AB (ref 70–99)
Glucose-Capillary: 124 mg/dL — ABNORMAL HIGH (ref 70–99)
Glucose-Capillary: 158 mg/dL — ABNORMAL HIGH (ref 70–99)
Glucose-Capillary: 170 mg/dL — ABNORMAL HIGH (ref 70–99)

## 2014-02-28 MED ORDER — AMIODARONE HCL IN DEXTROSE 360-4.14 MG/200ML-% IV SOLN
60.0000 mg/h | INTRAVENOUS | Status: AC
  Administered 2014-02-28: 60 mg/h via INTRAVENOUS
  Filled 2014-02-28: qty 200

## 2014-02-28 MED ORDER — AMIODARONE HCL IN DEXTROSE 360-4.14 MG/200ML-% IV SOLN
30.0000 mg/h | INTRAVENOUS | Status: DC
  Administered 2014-02-28 – 2014-03-01 (×2): 30 mg/h via INTRAVENOUS
  Filled 2014-02-28 (×3): qty 200

## 2014-02-28 MED FILL — Mannitol IV Soln 20%: INTRAVENOUS | Qty: 500 | Status: AC

## 2014-02-28 MED FILL — Sodium Chloride IV Soln 0.9%: INTRAVENOUS | Qty: 2000 | Status: AC

## 2014-02-28 MED FILL — Electrolyte-R (PH 7.4) Solution: INTRAVENOUS | Qty: 3000 | Status: AC

## 2014-02-28 MED FILL — Lidocaine HCl IV Inj 20 MG/ML: INTRAVENOUS | Qty: 10 | Status: AC

## 2014-02-28 MED FILL — Sodium Bicarbonate IV Soln 8.4%: INTRAVENOUS | Qty: 50 | Status: AC

## 2014-02-28 MED FILL — Heparin Sodium (Porcine) Inj 1000 Unit/ML: INTRAMUSCULAR | Qty: 50 | Status: AC

## 2014-02-28 NOTE — Progress Notes (Signed)
Midsternal aquacel dressing removed and betadine applied. Pt rhythm atrial fibrillation prior to removal. Pt resting with call bell within reach.  Will continue to monitor. Payton Emerald, RN

## 2014-02-28 NOTE — Progress Notes (Signed)
Utilization review completed.  

## 2014-02-28 NOTE — Progress Notes (Addendum)
Pt noted to be in an irregular rhythm.  12 lead EKG confirmed Afib, rate in 80/90s.  Pt asymptomatic at this time, VSS.  Will notify day PA.  Will report to day RN.

## 2014-02-28 NOTE — Progress Notes (Signed)
      [address]       [place] [address]             [phone]     CARDIOTHORACIC SURGERY PROGRESS NOTE  3 Days Post-Op  S/P Procedure(s) (LRB): CORONARY ARTERY BYPASS GRAFTING (CABG) (N/A) INTRAOPERATIVE TRANSESOPHAGEAL ECHOCARDIOGRAM (N/A)  Subjective: Patient doing well. She reports colitis flare that has not improved significantly from her first dose of steroid (yesterday). She states that this has resulted in straining when she has a bowel movement, and has noticed that her heart monitor beeps when she does so. She has been noted to be going into Afib. Otherwise the patient states she is somewhat sore from the procedure, but her pain is well managed.   Objective: Vital signs in last 24 hours: Temp:  [98 F (36.7 C)-98.5 F (36.9 C)] 98.5 F (36.9 C) (11/23 0609) Pulse Rate:  [76-89] 89 (11/23 0609) Cardiac Rhythm:  [-] Normal sinus rhythm (11/22 1956) Resp:  [21-29] 23 (11/23 0609) BP: (105-123)/(39-56) 112/56 mmHg (11/23 0609) SpO2:  [90 %-100 %] 90 % (11/23 0609) Weight:  [205 lb 9.6 oz (93.26 kg)] 205 lb 9.6 oz (93.26 kg) (11/23 9379)  Physical Exam:  Rhythm:   A Fib, rate of 87  Breath sounds: Clear to auscultation bilaterally  Heart sounds:  Irregular rate and rhythm, no murmurs, rubs, or gallops  Incisions:  Clean and dry  Abdomen:  Soft, non distended  Extremities:  Warm, well perfused, non edematous   Intake/Output from previous day: 11/22 0701 - 11/23 0700 In: 240 [P.O.:240] Out: 1320 [Urine:1320] Intake/Output this shift:    Lab Results:  Recent Labs  02/27/14 0510 02/28/14 0426  WBC 10.9* 11.0*  HGB 8.5* 8.4*  HCT 26.6* 26.5*  PLT 234 285   BMET:  Recent Labs  02/27/14 0510 02/28/14 0426  NA 135* 135*  K 4.3 4.5  CL 96 97  CO2 24 24  GLUCOSE 115* 126*  BUN 9 13  CREATININE 0.81 0.76  CALCIUM 8.5 8.8    CBG (last 3)   Recent Labs  02/27/14 1629 02/27/14 2142 02/28/14 0617  GLUCAP 142* 188* 132*    PT/INR:   Recent Labs  02/25/14 1355  LABPROT 16.8*  INR 1.34    CXR:  CXR from this morning pending  Assessment/Plan: S/P Procedure(s) (LRB): CORONARY ARTERY BYPASS GRAFTING (CABG) (N/A) INTRAOPERATIVE TRANSESOPHAGEAL ECHOCARDIOGRAM (N/A)  Patient progressing well, but has gone back into A fib. She is currently rate controlled and is asymptomatic. Consider beginning Amiodarone for possible conversion. Continue IV solumedrol for UC. Hgb stable at 8.4 this AM, baseline is 10.3. All other labs reviewed and stable.   Waylan Rocher, PA-S2 02/28/2014 8:00 AM  Patient examined, agree with above assessment by Meryl Dare, PA-S Chest x-ray today shows mild atelectasis Abdomen nontender exam Patient back in sinus rhythm on intravenous amiodarone We'll convert to oral amiodarone tomorrow\hold Coumadin because of inflammatory bowel disease Plan for discharge home on Wednesday p.m.

## 2014-02-28 NOTE — Plan of Care (Signed)
Problem: Phase III - Recovery through Discharge Goal: Maintain Hemodynamic Stability Outcome: Completed/Met Date Met:  02/28/14

## 2014-02-28 NOTE — Progress Notes (Signed)
CARDIAC REHAB PHASE I   PRE:  Rate/Rhythm: 80 SR  BP:  Supine:   Sitting: 120/62  Standing:    SaO2: 95 2L  MODE:  Ambulation: 350 ft   POST:  Rate/Rhythm: 111 ST  BP:  Supine:   Sitting: to bathroom  Standing:    SaO2: 95 2L 1025-1050 Assisted X 1 used walker and O2 2L to ambulate. Gait steady with walker. Pt able to walk 350 feet and had to return to room to go to the bathroom. No c/o with walking.  Rodney Langton RN 02/28/2014 10:48 AM

## 2014-03-01 ENCOUNTER — Encounter (HOSPITAL_COMMUNITY): Payer: Self-pay | Admitting: Cardiothoracic Surgery

## 2014-03-01 LAB — GLUCOSE, CAPILLARY
GLUCOSE-CAPILLARY: 131 mg/dL — AB (ref 70–99)
Glucose-Capillary: 130 mg/dL — ABNORMAL HIGH (ref 70–99)
Glucose-Capillary: 136 mg/dL — ABNORMAL HIGH (ref 70–99)
Glucose-Capillary: 147 mg/dL — ABNORMAL HIGH (ref 70–99)

## 2014-03-01 LAB — BASIC METABOLIC PANEL
Anion gap: 16 — ABNORMAL HIGH (ref 5–15)
BUN: 18 mg/dL (ref 6–23)
CO2: 24 mEq/L (ref 19–32)
Calcium: 9.3 mg/dL (ref 8.4–10.5)
Chloride: 95 mEq/L — ABNORMAL LOW (ref 96–112)
Creatinine, Ser: 0.79 mg/dL (ref 0.50–1.10)
GFR calc Af Amer: >90 mL/min (ref >90)
GFR calc non Af Amer: 83 mL/min — ABNORMAL LOW (ref >90)
Glucose, Bld: 137 mg/dL — ABNORMAL HIGH (ref 70–99)
Potassium: 4.3 mEq/L (ref 3.7–5.3)
Sodium: 135 mEq/L — ABNORMAL LOW (ref 137–147)

## 2014-03-01 LAB — CBC
HCT: 28.3 % — ABNORMAL LOW (ref 36.0–46.0)
Hemoglobin: 9.2 g/dL — ABNORMAL LOW (ref 12.0–15.0)
MCH: 27.7 pg (ref 26.0–34.0)
MCHC: 32.5 g/dL (ref 30.0–36.0)
MCV: 85.2 fL (ref 78.0–100.0)
Platelets: 369 10*3/uL (ref 150–400)
RBC: 3.32 MIL/uL — ABNORMAL LOW (ref 3.87–5.11)
RDW: 15.3 % (ref 11.5–15.5)
WBC: 12.9 10*3/uL — ABNORMAL HIGH (ref 4.0–10.5)

## 2014-03-01 MED ORDER — AMIODARONE HCL 200 MG PO TABS
200.0000 mg | ORAL_TABLET | Freq: Every day | ORAL | Status: DC
  Administered 2014-03-01 – 2014-03-02 (×2): 200 mg via ORAL
  Filled 2014-03-01 (×2): qty 1

## 2014-03-01 MED FILL — Magnesium Sulfate Inj 50%: INTRAMUSCULAR | Qty: 10 | Status: AC

## 2014-03-01 MED FILL — Potassium Chloride Inj 2 mEq/ML: INTRAVENOUS | Qty: 40 | Status: AC

## 2014-03-01 MED FILL — Heparin Sodium (Porcine) Inj 1000 Unit/ML: INTRAMUSCULAR | Qty: 30 | Status: AC

## 2014-03-01 NOTE — Progress Notes (Signed)
[address]       [place] [address]             [phone]      4 Days Post-Op Procedure(s) (LRB): CORONARY ARTERY BYPASS GRAFTING (CABG) (N/A) INTRAOPERATIVE TRANSESOPHAGEAL ECHOCARDIOGRAM (N/A) Subjective: Feels ok overall, some bradycardia overnight  Objective: Vital signs in last 24 hours: Temp:  [98.1 F (36.7 C)-98.5 F (36.9 C)] 98.5 F (36.9 C) (11/24 0446) Pulse Rate:  [68-74] 68 (11/24 0446) Cardiac Rhythm:  [-] Normal sinus rhythm (11/23 2005) Resp:  [18-22] 18 (11/24 0446) BP: (110-140)/(50-58) 140/58 mmHg (11/24 0446) SpO2:  [93 %-98 %] 93 % (11/24 0446) Weight:  [206 lb 12.7 oz (93.8 kg)] 206 lb 12.7 oz (93.8 kg) (11/24 0446)  Hemodynamic parameters for last 24 hours:    Intake/Output from previous day: 11/23 0701 - 11/24 0700 In: 720 [P.O.:720] Out: -  Intake/Output this shift:    General appearance: alert, cooperative and no distress Heart: regular rate and rhythm Lungs: mild wheeze, dim in bases Abdomen: benign Extremities: +LE edema Wound: healing well  Lab Results:  Recent Labs  02/28/14 0426 03/01/14 0349  WBC 11.0* 12.9*  HGB 8.4* 9.2*  HCT 26.5* 28.3*  PLT 285 369   BMET:  Recent Labs  02/28/14 0426 03/01/14 0349  NA 135* 135*  K 4.5 4.3  CL 97 95*  CO2 24 24  GLUCOSE 126* 137*  BUN 13 18  CREATININE 0.76 0.79  CALCIUM 8.8 9.3    PT/INR: No results for input(s): LABPROT, INR in the last 72 hours. ABG    Component Value Date/Time   PHART 7.349* 02/26/2014 0412   HCO3 21.4 02/26/2014 0412   TCO2 23 02/26/2014 0412   ACIDBASEDEF 4.0* 02/26/2014 0412   O2SAT 97.0 02/26/2014 0412   CBG (last 3)   Recent Labs  02/28/14 1623 02/28/14 2108 03/01/14 0629  GLUCAP 158* 170* 131*    Meds Scheduled Meds: . acetaminophen  1,000 mg Oral 4 times per day   Or  . acetaminophen (TYLENOL) oral liquid 160 mg/5 mL  1,000 mg Per Tube 4 times per day  . acidophilus  1 capsule Oral Daily  .  antiseptic oral rinse  7 mL Mouth Rinse BID  . atorvastatin  40 mg Oral q1800  . budesonide-formoterol  2 puff Inhalation BID  . ferrous sulfate  325 mg Oral BID WC  . fluconazole  150 mg Oral Daily  . furosemide  40 mg Oral Daily  . insulin aspart  0-24 Units Subcutaneous TID AC & HS  . Mesalamine  800 mg Oral TID WC  . methylPREDNISolone (SOLU-MEDROL) injection  40 mg Intravenous Daily  . metoprolol tartrate  12.5 mg Oral BID   Or  . metoprolol tartrate  12.5 mg Per Tube BID  . nicotine  21 mg Transdermal Daily  . pantoprazole  40 mg Oral Daily  . potassium chloride  20 mEq Oral Daily  . sodium chloride  3 mL Intravenous Q12H  . sodium chloride  3 mL Intravenous Q12H   Continuous Infusions: . sodium chloride 20 mL/hr (02/25/14 1400)  . amiodarone 30 mg/hr (03/01/14 0359)  . lactated ringers 20 mL/hr (02/26/14 1659)   PRN Meds:.sodium chloride, levalbuterol, metoprolol, ondansetron (ZOFRAN) IV, oxyCODONE, sodium chloride, sodium chloride, traMADol  Xrays Dg Chest 2 View  02/28/2014   CLINICAL DATA:  Status post CABG ; improving status  EXAM: CHEST  2 VIEW  COMPARISON:  Portable chest  x-ray of February 27, 2014  FINDINGS: The right lung is well-expanded and clear. On the left there is mild basilar atelectasis which has improved further. The left hemidiaphragm is better demonstrated today. The cardiac silhouette remains enlarged. The pulmonary vascularity is not engorged. The patient has undergone previous CABG.  IMPRESSION: There has been further interval improvement in the appearance of the left hemithorax consistent with resolving atelectasis and small pleural effusion. There is no pulmonary edema.   Electronically Signed   By: David  Martinique   On: 02/28/2014 08:03    Assessment/Plan: S/P Procedure(s) (LRB): CORONARY ARTERY BYPASS GRAFTING (CABG) (N/A) INTRAOPERATIVE TRANSESOPHAGEAL ECHOCARDIOGRAM (N/A) 1 doing well 2 change to po amio 200 mg daily- monitor rhythm/rate closely 3  sugars ok control- HGB A1C is 6.5 so will need close follow up by primary, she is on steroids for UC 4 labs stable Push pulm toilet/rehab as able  LOS: 7 days    Neev Mcmains E 03/01/2014

## 2014-03-01 NOTE — Progress Notes (Signed)
Medicare Important Message given? YES  (If response is "NO", the following Medicare IM given date fields will be blank)  Date Medicare IM given: 03/01/14 Medicare IM given by:  Dahlia Client [employer]

## 2014-03-01 NOTE — Plan of Care (Signed)
Problem: Phase II Progression Outcomes Goal: Hemodynamically stable Outcome: Completed/Met Date Met:  03/01/14  Problem: Phase III - Recovery through Discharge Goal: Activity Progressed Outcome: Progressing

## 2014-03-01 NOTE — Progress Notes (Signed)
CARDIAC REHAB PHASE I   PRE:  Rate/Rhythm: 71 SR  BP:  Supine:   Sitting: 133/53  Standing:    SaO2: 95 1L 92 RA  MODE:  Ambulation: 550 ft   POST:  Rate/Rhythm: 100 SR  BP:  Supine:   Sitting: 136/83  Standing:    SaO2: 93 RA 1010-1055 On arrival pt in recliner sat on 1L 95%, O2 discontinued sat room air 92%. Assisted X 1 to ambulate. Gait steady with hand held assist. Pt does tire with walking and took one standing rest stop in hall. She has a walker at home and I recommended her to use it until her legs get stronger. She was able to walk 550 feet. Room air sat after walk 93%, O2 left off. Discussed Outpt. CRP with pt she agrees to referral to referral to [facility]. Also discussed smoking cessation with her. She feels that she is not going to have any difficulty quitting. I gave her tips for quitting, coaching contact number and quit smart class information. Also left pt heart healthy and diabetic diet information. Reported to RN O2 left off.  Rodney Langton RN 03/01/2014 10:50 AM

## 2014-03-02 ENCOUNTER — Other Ambulatory Visit: Payer: Self-pay | Admitting: Physician Assistant

## 2014-03-02 LAB — GLUCOSE, CAPILLARY
Glucose-Capillary: 119 mg/dL — ABNORMAL HIGH (ref 70–99)
Glucose-Capillary: 116 mg/dL — ABNORMAL HIGH (ref 70–99)

## 2014-03-02 MED ORDER — FUROSEMIDE 40 MG PO TABS: 40.0000 mg | ORAL_TABLET | Freq: Every day | ORAL | Status: DC

## 2014-03-02 MED ORDER — BUDESONIDE-FORMOTEROL FUMARATE 160-4.5 MCG/ACT IN AERO: 2.0000 | INHALATION_SPRAY | Freq: Two times a day (BID) | RESPIRATORY_TRACT | Status: DC

## 2014-03-02 MED ORDER — METOPROLOL TARTRATE 25 MG PO TABS: 12.5000 mg | ORAL_TABLET | Freq: Two times a day (BID) | ORAL | Status: DC

## 2014-03-02 MED ORDER — ENALAPRIL MALEATE 2.5 MG PO TABS: 2.5000 mg | ORAL_TABLET | Freq: Every day | ORAL | Status: DC

## 2014-03-02 MED ORDER — POTASSIUM CHLORIDE CRYS ER 20 MEQ PO TBCR: 20.0000 meq | EXTENDED_RELEASE_TABLET | Freq: Every day | ORAL | Status: DC

## 2014-03-02 MED ORDER — ATORVASTATIN CALCIUM 40 MG PO TABS: 40.0000 mg | ORAL_TABLET | Freq: Every day | ORAL | Status: DC

## 2014-03-02 MED ORDER — ENALAPRIL MALEATE 2.5 MG PO TABS
2.5000 mg | ORAL_TABLET | Freq: Every day | ORAL | Status: DC
  Administered 2014-03-02: 2.5 mg via ORAL
  Filled 2014-03-02: qty 1

## 2014-03-02 MED ORDER — LISINOPRIL 2.5 MG PO TABS
2.5000 mg | ORAL_TABLET | Freq: Every day | ORAL | Status: DC
  Filled 2014-03-02: qty 1

## 2014-03-02 MED ORDER — NICOTINE 21 MG/24HR TD PT24: 21.0000 mg | MEDICATED_PATCH | Freq: Every day | TRANSDERMAL | Status: DC

## 2014-03-02 MED ORDER — AMIODARONE HCL 200 MG PO TABS: 200.0000 mg | ORAL_TABLET | Freq: Every day | ORAL | Status: DC

## 2014-03-02 MED ORDER — OXYCODONE HCL 5 MG PO TABS: 5.0000 mg | ORAL_TABLET | ORAL | Status: DC | PRN

## 2014-03-02 MED ORDER — ENALAPRIL MALEATE 2.5 MG PO TABS: 20.0000 mg | ORAL_TABLET | Freq: Every day | ORAL | Status: DC

## 2014-03-02 NOTE — Progress Notes (Signed)
CT sutures removed per order. Benzoin and 1/2" steri strips applied. Pt instructed to leave Steris on until they fall off. Verbalized understanding. Will continue to monitor pt closely.

## 2014-03-02 NOTE — Plan of Care (Addendum)
Problem: Phase II Progression Outcomes Goal: Stress Test if indicated Outcome: Not Applicable Date Met:  24/23/53  Problem: Phase III Progression Outcomes Goal: Hemodynamically stable Outcome: Completed/Met Date Met:  03/02/14 Goal: No anginal pain Outcome: Completed/Met Date Met:  03/02/14 Goal: Vascular site scale level 0 - I Vascular Site Scale Level 0: No bruising/bleeding/hematoma Level I (Mild): Bruising/Ecchymosis, minimal bleeding/ooozing, palpable hematoma < 3 cm Level II (Moderate): Bleeding not affecting hemodynamic parameters, pseudoaneurysm, palpable hematoma > 3 cm Level III (Severe) Bleeding which affects hemodynamic parameters or retroperitoneal hemorrhage  Outcome: Completed/Met Date Met:  03/02/14 Goal: Discharge plan remains appropriate-arrangements made Outcome: Completed/Met Date Met:  03/02/14 Goal: Tolerating diet Outcome: Completed/Met Date Met:  03/02/14  Problem: Discharge Progression Outcomes Goal: No anginal pain Outcome: Completed/Met Date Met:  03/02/14

## 2014-03-02 NOTE — Discharge Summary (Addendum)
Physician Discharge Summary       [address]       [place] [address]             [phone]    Patient ID: Bonnie Powers MRN: [id number] DOB/AGE: [birth year] 68 y.o.  Admit date: 02/22/2014 Discharge date: 03/02/2014  Admission Diagnoses: 1. S/p NSTEMI  2. Multivessel CAD 3. History of hypertension 4. History of hypercholesterolemia 5. History of tobacco abuse 6. History of ulcerative colitis 7. History of Hepatitis 8. History of OA  Discharge Diagnoses:  1. S/p NSTEMI  2. Multivessel CAD 3. History of hypertension 4. History of hypercholesterolemia 5. History of tobacco abuse 6. History of ulcerative colitis 7. History of Hepatitis 8. History of OA 9. ABL anemia 10. Post op atrial fibrillation (converted to sinus rhythm)   Procedure (s):  1. Coronary catheterization done by Dr. Julianne Handler on 02/23/2014: Angiographic Findings:  Left main: No obstructive disease.   Left Anterior Descending Artery: Large caliber vessel that courses to the apex. The proximal vessel has a 95% stenosis just before the takeoff of the moderate caliber diagonal branch. Just beyond the takeoff of the diagonal branch, the mid LAD has an aneurysmal segment followed by a 30% stenosis. The remainder of the mid and distal LAD has no obstructive disease. The diagonal branch is a moderate caliber, bifurcating vessel with ostial 50% stenosis.  Circumflex Artery: Large caliber vessel with large obtuse marginal branch. There is mild plaque in the mid Circumflex. Right Coronary Artery: Large dominant vessel with diffuse 40% proximal stenosis followed by 95% stenosis in the proximal to mid vessel. The remainder of the mid and distal vessel has mild plaque disease.  Left Ventricular Angiogram: LVEF=45% with hypokinesis of the anterior wall and apex.   Impression: 1. 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  NSTEMI 3. Moderate segmental LV systolic dysfunction  Coronary artery bypass grafting x3 (left internal mammary artery to  LAD, saphenous vein graft to diagonal, saphenous vein graft to right coronary artery) with endoscopic harvest of left leg greater saphenous vein by Dr. Prescott Gum on 02/25/2014.  History of Presenting Illness: This is a 68 year old obese smoker with chronic ulcerative colitis on imuran presents with hx of unstable angina. She was having exertional angina which increased to resting angina at the time of admission. Her cardiac enzymes are mildly elevated-she had nonspecific EKG changes. She underwent cardiac catheterization to define her coronary anatomy. This was performed via the right radial artery and demonstrated high-grade 95% stenosis of the dominant RCA, 90% stenosis of the proximal LAD and 50% stenosis of the first diagonal branch of the LAD. LV systolic function was preserved. 2-D echocardiogram is pending. She is placed on IV heparin and has had no further chest pain. She is felt to be candidate for surgical coronary revascularization because of her chronic ulcerative colitis and significant potential problems with remaining on double antiplatelet agents with bleeding risk.  Brief Hospital Course:  The patient was extubated the evening of surgery without difficulty. She remained afebrile and hemodynamically stable. Gordy Councilman, a line, chest tubes, and foley were removed early in the post operative course. She did have brief atrial fibrillation. She was started on Amiodarone. She converted to sinus rhythm and remained. Lopressor was started and titrated accordingly. She was volume over loaded and diuresed. She was weaned off the insulin drip.  The patient's HGA1C pre op was  6.5 Her glucose remained well controlled. She will need to follow up with her medical doctor regarding further diabetes management and surveillance of HGA1C 6.5.The patient was felt surgically stable for  transfer from the ICU to PCTU for further convalescence on 02/28/2013. She continues to progress with cardiac rehab. She was ambulating on room air. She has been tolerating a diet and has had multiple bowel movements (has a history of ulcerative colitis). Epicardial pacing wires and chest tube sutures will be removed prior to discharge. She has been seen and evaluated by Dr. Prescott Gum and myself. She is felt surgically stable for discharge today.   Latest Vital Signs: Blood pressure 148/55, pulse 91, temperature 98 F (36.7 C), temperature source Oral, resp. rate 16, height 5' 6"  (1.676 m), weight 205 lb 7.5 oz (93.2 kg), SpO2 91 %.  Physical Exam: Cardiovascular: RRR, no murmurs, gallops, or rubs. Pulmonary: Slightly diminished at bases; no rales, wheezes, or rhonchi. Abdomen: Soft, non tender, bowel sounds present. Extremities: Trace bilateral lower extremity edema. Wounds: Clean and dry. No erythema or signs of infection.  Discharge Condition:Stable  Recent laboratory studies:  Lab Results  Component Value Date   WBC 12.9* 03/01/2014   HGB 9.2* 03/01/2014   HCT 28.3* 03/01/2014   MCV 85.2 03/01/2014   PLT 369 03/01/2014   Lab Results  Component Value Date   NA 135* 03/01/2014   K 4.3 03/01/2014   CL 95* 03/01/2014   CO2 24 03/01/2014   CREATININE 0.79 03/01/2014   GLUCOSE 137* 03/01/2014    Diagnostic Studies: Dg Chest 2 View  02/28/2014   CLINICAL DATA:  Status post CABG ; improving status  EXAM: CHEST  2 VIEW  COMPARISON:  Portable chest x-ray of February 27, 2014  FINDINGS: The right lung is well-expanded and clear. On the left there is mild basilar atelectasis which has improved further. The left hemidiaphragm is better demonstrated today. The cardiac silhouette remains enlarged. The pulmonary vascularity is not engorged. The patient has undergone previous CABG.  IMPRESSION: There has been further interval improvement in the appearance of the left hemithorax consistent  with resolving atelectasis and small pleural effusion. There is no pulmonary edema.   Electronically Signed   By: David  Martinique   On: 02/28/2014 08:03        Discharge Instructions    Amb Referral to Cardiac Rehabilitation    Complete by:  As directed            Discharge Medications:   Medication List    STOP taking these medications        amoxicillin-clavulanate 875-125 MG per tablet  Commonly known as:  AUGMENTIN     azaTHIOprine 50 MG tablet  Commonly known as:  IMURAN      TAKE these medications        acetaminophen 500 MG tablet  Commonly known as:  TYLENOL  Take 500-1,000 mg by mouth daily.     amiodarone 200 MG tablet  Commonly known as:  PACERONE  Take 1 tablet (200 mg total) by mouth daily.     ASACOL HD 800 MG Tbec  Generic drug:  Mesalamine  Take 800 mg by mouth 3 (three) times daily.     budesonide-formoterol 160-4.5 MCG/ACT inhaler  Commonly known as:  SYMBICORT  Inhale 2 puffs into the lungs 2 (two) times daily.     CALCITRATE PLUS D PO  Take 1 tablet by mouth daily. Calcium Citrate 630/Vitamin d 500  cholecalciferol 1000 UNITS tablet  Commonly known as:  VITAMIN D  Take 1,000 Units by mouth daily.     dicyclomine 10 MG capsule  Commonly known as:  BENTYL  Take 1 capsule (10 mg total) by mouth 4 (four) times daily as needed. For diarrhea     enalapril 2.5 MG tablet  Commonly known as:  VASOTEC  Take 1 tablet (2.5 mg total) by mouth daily.     ferrous sulfate 325 (65 FE) MG tablet  Take 325 mg by mouth 2 (two) times daily with a meal.     furosemide 40 MG tablet  Commonly known as:  LASIX  Take 1 tablet (40 mg total) by mouth daily. For 5 days then stop.     metoprolol tartrate 25 MG tablet  Commonly known as:  LOPRESSOR  Take 0.5 tablets (12.5 mg total) by mouth 2 (two) times daily.     multivitamin with minerals Tabs tablet  Take 1 tablet by mouth daily.     nicotine 21 mg/24hr patch  Commonly known as:  NICODERM CQ - dosed  in mg/24 hours  Place 1 patch (21 mg total) onto the skin daily.     oxyCODONE 5 MG immediate release tablet  Commonly known as:  Oxy IR/ROXICODONE  Take 1-2 tablets (5-10 mg total) by mouth every 3 (three) hours as needed for severe pain.     potassium chloride SA 20 MEQ tablet  Commonly known as:  K-DUR,KLOR-CON  Take 1 tablet (20 mEq total) by mouth daily. For 5 days then stop.     PROBIOTIC FORMULA PO  Take 1 tablet by mouth daily.        The patient has been discharged on:   1.Beta Blocker:  Yes [  x ]                              No   [   ]                              If No, reason:  2.Ace Inhibitor/ARB: Yes [ x  ]                                     No  [    ]                                     If No, reason:  3.Statin:   Yes [   ]                  No  [  x ]                  If No, reason: Intolerance to statins as has tried before (muscle aches)  4.Shela Commons:  Yes  [ x  ]                  No   [   ]                  If No, reason:  Follow Up Appointments: Follow-up Information    Follow up with Lauree Chandler, MD.   Specialty:  Cardiology   Why:  Call for a follow up appointment for 2 weeks   Contact information:   [place]. [address] [place] [address] [phone]       Follow up with VAN Wilber Oliphant, MD On 04/13/2014.   Specialty:  Cardiothoracic Surgery   Why:  PA/LAT CXR to be taken (at [facility] which is in the same building as Dr. Lucianne Lei Trigt's office) on 04/13/2014 at 11:00 am;Appointment time is at 12:00 pm   Contact information:   [place] [address] [phone]       Follow up with Velta Addison, [name], DO.   Specialty:  Osteopathic Medicine   Why:  Please call for a follow up appointment regarding HGA1C 6.5   Contact information:   [place] [address] [phone]       Signed: Lars Pinks MPA-C 03/02/2014, 12:57 PM

## 2014-03-02 NOTE — Progress Notes (Addendum)
      [address]       [place] [address]             [phone]        5 Days Post-Op Procedure(s) (LRB): CORONARY ARTERY BYPASS GRAFTING (CABG) (N/A) INTRAOPERATIVE TRANSESOPHAGEAL ECHOCARDIOGRAM (N/A)  Subjective: Patient ambulating in halls this am, no complaints, and she wants to go home.  Objective: Vital signs in last 24 hours: Temp:  [98 F (36.7 C)] 98 F (36.7 C) (11/24 1350) Pulse Rate:  [69-80] 80 (11/24 2055) Cardiac Rhythm:  [-] Normal sinus rhythm (11/25 0743) Resp:  [18] 18 (11/24 2055) BP: (121-133)/(53) 121/53 mmHg (11/24 1350) SpO2:  [92 %-94 %] 92 % (11/24 2055) Weight:  [205 lb 7.5 oz (93.2 kg)] 205 lb 7.5 oz (93.2 kg) (11/25 0500)  Pre op weight 92 kg Current Weight  03/02/14 205 lb 7.5 oz (93.2 kg)      Physical Exam:  Cardiovascular: RRR, no murmurs, gallops, or rubs. Pulmonary: Slightly diminished at bases; no rales, wheezes, or rhonchi. Abdomen: Soft, non tender, bowel sounds present. Extremities: Trace bilateral lower extremity edema. Wounds: Clean and dry.  No erythema or signs of infection.  Lab Results: CBC: Recent Labs  02/28/14 0426 03/01/14 0349  WBC 11.0* 12.9*  HGB 8.4* 9.2*  HCT 26.5* 28.3*  PLT 285 369   BMET:  Recent Labs  02/28/14 0426 03/01/14 0349  NA 135* 135*  K 4.5 4.3  CL 97 95*  CO2 24 24  GLUCOSE 126* 137*  BUN 13 18  CREATININE 0.76 0.79  CALCIUM 8.8 9.3    PT/INR:  Lab Results  Component Value Date   INR 1.34 02/25/2014   INR 1.15 02/23/2014   INR 1.13 02/22/2014   ABG:  INR: Will add last result for INR, ABG once components are confirmed Will add last 4 CBG results once components are confirmed  Assessment/Plan:  1. CV - S/p NSTEMI.Previous a fib, but has been maintaining SR with HR in the 80's. On Lopressor 12.5 bid. Will start low dose ACE as well. 2.  Pulmonary - Encourage incentive spirometer 3. Volume Overload - On Lasix 40 daily 4.  Acute blood loss anemia -  H and H yesterday stable at 9.2 and 28.3. Continue ferrous sulfate. 5. CBGs 130/136/147. Pre op HGA1C 6.5. She is likely a new diabetic and needs to follow up with medical doctor after discharge. 6. Remove EPW and sutures 7. Discharge this afternoon  ZIMMERMAN,DONIELLE MPA-C 03/02/2014,8:21 AM

## 2014-03-02 NOTE — Progress Notes (Signed)
Pt ambulated 400 ft pushing walker. Pt assisted back to bed for EPW removal.  Will continue to monitor pt closely.

## 2014-03-02 NOTE — Progress Notes (Signed)
EPW removed per order. Ends intact. VSS. Pt instructed of one hour bedrest. Verbalized understanding. Will continue to monitor pt cloesly.

## 2014-03-02 NOTE — Discharge Instructions (Signed)
Activity: 1.May walk up steps                2.No lifting more than ten pounds for four weeks.                 3.No driving for four weeks.                4.Stop any activity that causes chest pain, shortness of breath, dizziness, sweating or excessive weakness.                5.Avoid straining.                6.Continue with your breathing exercises daily.  Diet: Diabetic diet and Low fat, Low salt diet  Wound Care: May shower.  Clean wounds with mild soap and water daily. Contact the office at [phone] if any problems arise.  Coronary Artery Bypass Grafting, Care After Refer to this sheet in the next few weeks. These instructions provide you with information on caring for yourself after your procedure. Your health care provider may also give you more specific instructions. Your treatment has been planned according to current medical practices, but problems sometimes occur. Call your health care provider if you have any problems or questions after your procedure. WHAT TO EXPECT AFTER THE PROCEDURE Recovery from surgery will be different for everyone. Some people feel well after 3 or 4 weeks, while for others it takes longer. After your procedure, it is typical to have the following:  Nausea and a lack of appetite.   Constipation.  Weakness and fatigue.   Depression or irritability.   Pain or discomfort at your incision site. HOME CARE INSTRUCTIONS  Take medicines only as directed by your health care provider. Do not stop taking medicines or start any new medicines without first checking with your health care provider.  Take your pulse as directed by your health care provider.  Perform deep breathing as directed by your health care provider. If you were given a device called an incentive spirometer, use it to practice deep breathing several times a day. Support your chest with a pillow or your arms when you take deep breaths or cough.  Keep incision areas clean, dry, and  protected. Remove or change any bandages (dressings) only as directed by your health care provider. You may have skin adhesive strips over the incision areas. Do not take the strips off. They will fall off on their own.  Check incision areas daily for any swelling, redness, or drainage.  If incisions were made in your legs, do the following:  Avoid crossing your legs.   Avoid sitting for long periods of time. Change positions every 30 minutes.   Elevate your legs when you are sitting.  Wear compression stockings as directed by your health care provider. These stockings help keep blood clots from forming in your legs.  Take showers once your health care provider approves. Until then, only take sponge baths. Pat incisions dry. Do not rub incisions with a washcloth or towel. Do not take baths, swim, or use a hot tub until your health care provider approves.  Eat foods that are high in fiber, such as raw fruits and vegetables, whole grains, beans, and nuts. Meats should be lean cut. Avoid canned, processed, and fried foods.  Drink enough fluid to keep your urine clear or pale yellow.  Weigh yourself every day. This helps identify if you are retaining fluid that may make your heart and lungs  work harder.  Rest and limit activity as directed by your health care provider. You may be instructed to:  Stop any activity at once if you have chest pain, shortness of breath, irregular heartbeats, or dizziness. Get help right away if you have any of these symptoms.  Move around frequently for short periods or take short walks as directed by your health care provider. Increase your activities gradually. You may need physical therapy or cardiac rehabilitation to help strengthen your muscles and build your endurance.  Avoid lifting, pushing, or pulling anything heavier than 10 lb (4.5 kg) for at least 6 weeks after surgery.  Do not drive until your health care provider approves.  Ask your health  care provider when you may return to work.  Ask your health care provider when you may resume sexual activity.  Keep all follow-up visits as directed by your health care provider. This is important. SEEK MEDICAL CARE IF:  You have swelling, redness, increasing pain, or drainage at the site of an incision.  You have a fever.  You have swelling in your ankles or legs.  You have pain in your legs.   You gain 2 or more pounds (0.9 kg) a day.  You are nauseous or vomit.  You have diarrhea. SEEK IMMEDIATE MEDICAL CARE IF:  You have chest pain that goes to your jaw or arms.  You have shortness of breath.   You have a fast or irregular heartbeat.   You notice a "clicking" in your breastbone (sternum) when you move.   You have numbness or weakness in your arms or legs.  You feel dizzy or light-headed.  MAKE SURE YOU:  Understand these instructions.  Will watch your condition.  Will get help right away if you are not doing well or get worse. Document Released: 10/12/2004 Document Revised: 08/09/2013 Document Reviewed: 09/01/2012 [facility] Patient Information 2015 [place], [place]. This information is not intended to replace advice given to you by your health care provider. Make sure you discuss any questions you have with your health care provider.

## 2014-03-02 NOTE — Progress Notes (Signed)
Assessment unchanged. Discussed d/c instructions with patient including: follow up appointment and medication instructions. Patient verbalized understanding. IV and tele removed. Patient left with all belongings accompanied by volunteer.

## 2014-03-02 NOTE — Care Management Note (Signed)
    Page 1 of 1   03/02/2014     2:36:05 PM CARE MANAGEMENT NOTE 03/02/2014  Patient:  Bonnie Powers, Bonnie Powers   Account Number:  [id number]  Date Initiated:  02/28/2014  Documentation initiated by:  Marvetta Gibbons  Subjective/Objective Assessment:   Pt admitted s/p CABG x3     Action/Plan:   PTA pt lived at home with spouse   Anticipated DC Date:  03/03/2014   Anticipated DC Plan:  [place]  CM consult      Choice offered to / List presented to:             Status of service:  Completed, signed off Medicare Important Message given?  YES (If response is "NO", the following Medicare IM given date fields will be blank) Date Medicare IM given:  03/01/2014 Medicare IM given by:  Marvetta Gibbons Date Additional Medicare IM given:   Additional Medicare IM given by:    Discharge Disposition:  HOME/SELF CARE  Per UR Regulation:  Reviewed for med. necessity/level of care/duration of stay  If discussed at [place] of Stay Meetings, dates discussed:   03/01/2014    Comments:  03/01/14- 1145- Marvetta Gibbons RN, BSN [phone] Spoke with pt at bedside- per conversation pt has DME at home that includes RW, cane, w/c- does  not need any DME- she is off 02 presently- - spouse will be available to assist at discharge. No anticipated needs- NCM will follow  02/28/14- 1600- Marvetta Gibbons RN, BSN [phone] Pt placed on IV amio for afib,

## 2014-03-02 NOTE — Progress Notes (Signed)
[phone] Cardiac Rehab  Completed discharge education with pt. She voices understanding. Placed recovery from heart surgery video for pt to watch. Deon Pilling, RN 03/02/2014 10:19 AM

## 2014-03-09 ENCOUNTER — Telehealth: Payer: Self-pay | Admitting: *Deleted

## 2014-03-09 DIAGNOSIS — G8918 Other acute postprocedural pain: Secondary | ICD-10-CM

## 2014-03-09 MED ORDER — OXYCODONE HCL 5 MG PO TABS: 5.0000 mg | ORAL_TABLET | ORAL | Status: DC | PRN

## 2014-03-09 NOTE — Telephone Encounter (Signed)
Bonnie Powers called to see if she could take her antianxiety med after her CABG. I said that would be fine.  She also said she was almost out of pain med and would like a refill.  I said I would have Dr. Prescott Gum sign a new script and she could pick it up tomorrow from our office and she agreed.

## 2014-03-11 ENCOUNTER — Telehealth: Payer: Self-pay | Admitting: *Deleted

## 2014-03-11 NOTE — Telephone Encounter (Signed)
Bonnie Powers called to relate problems she is having with her ear and throat.  She said the ear is swollen and her throat is sore.  The ear problem is chronic and she felt she should probably see her PCP as usual. I agreed.  She is a recent CABG.

## 2014-03-17 ENCOUNTER — Encounter (HOSPITAL_COMMUNITY): Payer: Medicare HMO

## 2014-03-17 ENCOUNTER — Encounter (HOSPITAL_COMMUNITY): Payer: Self-pay | Admitting: Cardiovascular Disease

## 2014-03-18 ENCOUNTER — Encounter: Payer: Self-pay | Admitting: Nurse Practitioner

## 2014-03-18 ENCOUNTER — Ambulatory Visit (INDEPENDENT_AMBULATORY_CARE_PROVIDER_SITE_OTHER): Payer: Medicare HMO | Admitting: Nurse Practitioner

## 2014-03-18 VITALS — BP 130/52 | HR 66 | Ht 66.0 in | Wt 196.8 lb

## 2014-03-18 DIAGNOSIS — Z79899 Other long term (current) drug therapy: Secondary | ICD-10-CM

## 2014-03-18 DIAGNOSIS — I259 Chronic ischemic heart disease, unspecified: Secondary | ICD-10-CM

## 2014-03-18 DIAGNOSIS — I480 Paroxysmal atrial fibrillation: Secondary | ICD-10-CM

## 2014-03-18 DIAGNOSIS — I214 Non-ST elevation (NSTEMI) myocardial infarction: Secondary | ICD-10-CM

## 2014-03-18 LAB — BASIC METABOLIC PANEL
BUN: 11 mg/dL (ref 6–23)
CO2: 23 mEq/L (ref 19–32)
Calcium: 9.6 mg/dL (ref 8.4–10.5)
Chloride: 101 mEq/L (ref 96–112)
Creatinine, Ser: 1 mg/dL (ref 0.4–1.2)
GFR: 60.38 mL/min (ref >60.00)
Glucose, Bld: 98 mg/dL (ref 70–99)
Potassium: 4.2 mEq/L (ref 3.5–5.1)
Sodium: 132 mEq/L — ABNORMAL LOW (ref 135–145)

## 2014-03-18 LAB — CBC
HCT: 27.4 % — ABNORMAL LOW (ref 36.0–46.0)
Hemoglobin: 8.6 g/dL — ABNORMAL LOW (ref 12.0–15.0)
MCHC: 31.5 g/dL (ref 30.0–36.0)
MCV: 83.1 fl (ref 78.0–100.0)
Platelets: 476 10*3/uL — ABNORMAL HIGH (ref 150.0–400.0)
RBC: 3.3 Mil/uL — ABNORMAL LOW (ref 3.87–5.11)
RDW: 15.9 % — ABNORMAL HIGH (ref 11.5–15.5)
WBC: 6.9 10*3/uL (ref 4.0–10.5)

## 2014-03-18 LAB — TSH: TSH: 2.8 u[IU]/mL (ref 0.35–4.50)

## 2014-03-18 MED ORDER — ASPIRIN EC 81 MG PO TBEC: 81.0000 mg | DELAYED_RELEASE_TABLET | Freq: Every day | ORAL | Status: DC

## 2014-03-18 NOTE — Patient Instructions (Addendum)
We will be checking the following labs today BMET, CBC, TSH  Stay on all your current medicines - restart your aspirin at 81 mg a day  Congrats for not smoking!!!! Keep up the good work  I will see you in a month with fasting labs - we will talk about your cholesterol medicine  Ok to start cardiac rehab next week  Call the [place] office at [phone] if you have any questions, problems or concerns.

## 2014-03-18 NOTE — Progress Notes (Signed)
Bonnie Powers Date of Birth: 08/05/1945 Medical Record #[id number]  History of Present Illness: Bonnie Powers is seen back today for a post hospital visit. Seen for Dr. Mare Ferrari. She is a 68 year old female with HTN, HLD, tobacco abuse, hepatitis, OA, and ulcerative colitis on Umuran.   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 Prescott Gum in November of 2015. Post op course complicated by blood loss anemia and PAF.   Comes in today. Here with her husband, [name].  She has been home almost 3 weeks. Doing ok. Walking about 5 to 8 minutes a day. She is NOT smoking - using patches. Not short of breath. Some generalized post op soreness. Still using some narcotic at night. No palpitations. Tolerating her medicines. No fever or chills. She is coughing - all clear sputum. She is not on aspirin. Cannot tolerate statin.    Current Outpatient Prescriptions  Medication Sig Dispense Refill  . acetaminophen (TYLENOL) 500 MG tablet Take 500-1,000 mg by mouth daily.    Marland Kitchen amiodarone (PACERONE) 200 MG tablet Take 1 tablet (200 mg total) by mouth daily. 30 tablet 1  . amoxicillin-clavulanate (AUGMENTIN) 875-125 MG per tablet Take 1 tablet by mouth 2 (two) times daily.     Marland Kitchen azaTHIOprine (IMURAN) 50 MG tablet Take 50 mg by mouth 3 (three) times daily.   3  . budesonide-formoterol (SYMBICORT) 160-4.5 MCG/ACT inhaler Inhale 2 puffs into the lungs 2 (two) times daily. 1 Inhaler 0  . Calcium Citrate-Vitamin D (CALCITRATE PLUS D PO) Take 1 tablet by mouth daily. Calcium Citrate 630/Vitamin d 500    . cholecalciferol (VITAMIN D) 1000 UNITS tablet Take 1,000 Units by mouth daily.    Marland Kitchen dicyclomine (BENTYL) 10 MG capsule Take 1 capsule (10 mg total) by mouth 4 (four) times daily as needed. For diarrhea 90 capsule 3  . enalapril (VASOTEC) 2.5 MG tablet Take 1 tablet (2.5 mg total) by mouth daily. 30 tablet 1  . escitalopram  (LEXAPRO) 20 MG tablet Take 20 mg by mouth daily.     . ferrous sulfate 325 (65 FE) MG tablet Take 325 mg by mouth 2 (two) times daily with a meal.    . furosemide (LASIX) 40 MG tablet Take 1 tablet (40 mg total) by mouth daily. For 5 days then stop. 5 tablet 0  . Mesalamine (ASACOL HD) 800 MG TBEC Take 800 mg by mouth 3 (three) times daily.    . metoprolol tartrate (LOPRESSOR) 25 MG tablet Take 0.5 tablets (12.5 mg total) by mouth 2 (two) times daily. 30 tablet 1  . Multiple Vitamin (MULTIVITAMIN WITH MINERALS) TABS tablet Take 1 tablet by mouth daily.    . nicotine (NICODERM CQ - DOSED IN MG/24 HOURS) 21 mg/24hr patch Place 1 patch (21 mg total) onto the skin daily. 28 patch 0  . oxyCODONE (OXY IR/ROXICODONE) 5 MG immediate release tablet Take 1-2 tablets (5-10 mg total) by mouth every 3 (three) hours as needed for severe pain. 40 tablet 0  . potassium chloride SA (K-DUR,KLOR-CON) 20 MEQ tablet Take 1 tablet (20 mEq total) by mouth daily. For 5 days then stop. 5 tablet 0  . Probiotic Product (PROBIOTIC FORMULA PO) Take 1 tablet by mouth daily.     No current facility-administered medications for this visit.    Allergies  Allergen Reactions  . Statins Other (See  Comments)    Muscle aches  . Aspirin     Past Medical History  Diagnosis Date  . Ulcerative colitis 2001    Last colonoscopy Dr [name] 12/28/09-> pan-UC, normal TI, maintained on Asacol and Imuran  . HTN (hypertension)   . OA (osteoarthritis)   . Hypercholesteremia   . Hepatitis 1972    ? unknown type  . Otitis media, chronic   . Basal cell cancer     left temple  . Anxiety   . Depression   . Headache     Past Surgical History  Procedure Laterality Date  . Salpingoophorectomy      right ovary, both tubes  . Tonsillectomy    . Eye surgery    . Colonoscopy  12/28/09    Dr. [name] colitis  . Coronary artery bypass graft N/A 02/25/2014    Procedure: CORONARY ARTERY BYPASS GRAFTING (CABG);  Surgeon: Ivin Poot, MD;  Location: [facility];  Service: Open Heart Surgery;  Laterality: N/A;  . Intraoperative transesophageal echocardiogram N/A 02/25/2014    Procedure: INTRAOPERATIVE TRANSESOPHAGEAL ECHOCARDIOGRAM;  Surgeon: Ivin Poot, MD;  Location: [place];  Service: Open Heart Surgery;  Laterality: N/A;  . Left heart catheterization with coronary angiogram N/A 02/23/2014    Procedure: LEFT HEART CATHETERIZATION WITH CORONARY ANGIOGRAM;  Surgeon: Burnell Blanks, MD;  Location: [facility] CATH LAB;  Service: Cardiovascular;  Laterality: N/A;    History  Smoking status  . Former Smoker -- 1.00 packs/day for 30 years  . Types: Cigarettes  . Quit date: 02/22/2014  Smokeless tobacco  . Never Used    Comment: trying    History  Alcohol Use No    Family History  Problem Relation Age of Onset  . Skin cancer Father   . Heart failure Mother   . HIV Brother   . Lung cancer Brother     Review of Systems: The review of systems is per the HPI.  All other systems were reviewed and are negative.  Physical Exam: BP 130/52 mmHg  Pulse 66  Ht 5' 6"  (1.676 m)  Wt 196 lb 12.8 oz (89.268 kg)  BMI 31.78 kg/m2 Patient is very pleasant and in no acute distress. Skin is warm and dry. Color is sallow. HEENT is unremarkable. Normocephalic/atraumatic. PERRL. Sclera are nonicteric. Neck is supple. No masses. No JVD. Lungs are coarse. Cardiac exam shows a regular rate and rhythm. Sternum ok. Abdomen is soft. Extremities are without edema. Gait and ROM are intact. No gross neurologic deficits noted.  Wt Readings from Last 3 Encounters:  03/18/14 196 lb 12.8 oz (89.268 kg)  03/02/14 205 lb 7.5 oz (93.2 kg)  12/14/12 186 lb 12.8 oz (84.732 kg)    LABORATORY DATA/PROCEDURES:  Lab Results  Component Value Date   WBC 12.9* 03/01/2014   HGB 9.2* 03/01/2014   HCT 28.3* 03/01/2014   PLT 369 03/01/2014   GLUCOSE 137* 03/01/2014   CHOL 171 02/23/2014   TRIG 94 02/23/2014   HDL 40 02/23/2014   LDLCALC 112*  02/23/2014   ALT 8 02/22/2014   AST 12 02/22/2014   NA 135* 03/01/2014   K 4.3 03/01/2014   CL 95* 03/01/2014   CREATININE 0.79 03/01/2014   BUN 18 03/01/2014   CO2 24 03/01/2014   INR 1.34 02/25/2014   HGBA1C 6.5* 02/25/2014    BNP (last 3 results) No results for input(s): PROBNP in the last 8760 hours.   Echo Study Conclusions from November 2015  -  Left ventricle: The cavity size was normal. Wall thickness was increased increased in a pattern of mild to moderate LVH. Systolic function was normal. The estimated ejection fraction was in the range of 55% to 60%. Wall motion was normal; there were no regional wall motion abnormalities. - Aortic valve: Trileaflet; normal thickness, mildly calcified leaflets. There was mild to moderate regurgitation. - Mitral valve: Mildly calcified annulus. - Left atrium: The atrium was moderately to severely dilated.  Assessment / Plan: 1. NSTEMI with CABG x 3 - well preserved LV function - doing ok clinically. See back in 4 weeks. Ok to start cardiac rehab next week. She is agreeable to restarting aspirin at 81 mg daily. She says she is not allergic.   2. Tobacco abuse -  Congratulated for not smoking - hopefully she will be able to sustain.  3. PAF - on amiodarone. EKG shows NSR today with anterior T wave changes. Hope to discontinue on return. I have placed her back on aspirin.   4. HLD -  Statin intolerant. May need to consider referral to lipid clinic. Will recheck her lab on return.   Patient is agreeable to this plan and will call if any problems develop in the interim.   Burtis Junes, RN, [place] [address] [place], [place]  [phone]

## 2014-03-24 ENCOUNTER — Encounter (HOSPITAL_COMMUNITY): Payer: Self-pay

## 2014-03-24 ENCOUNTER — Encounter (HOSPITAL_COMMUNITY)
Admission: RE | Admit: 2014-03-24 | Discharge: 2014-03-24 | Disposition: A | Discharge status: Home / Self Care | Payer: Medicare HMO | Source: Ambulatory Visit | Attending: Cardiovascular Disease | Admitting: Cardiovascular Disease

## 2014-03-24 VITALS — BP 120/60 | HR 71 | Ht 66.0 in | Wt 197.7 lb

## 2014-03-24 DIAGNOSIS — Z951 Presence of aortocoronary bypass graft: Secondary | ICD-10-CM

## 2014-03-24 DIAGNOSIS — I213 ST elevation (STEMI) myocardial infarction of unspecified site: Secondary | ICD-10-CM | POA: Insufficient documentation

## 2014-03-24 NOTE — Progress Notes (Signed)
Patient was referred to Cardiac Rehab by Dr. Mare Ferrari post CABGx3 Z95.1. During orientation advised patient on arrival and appointment times what to wear, what to do before, during and after exercise. Reviewed attendance and class policy. Talked about inclement weather and class consultation policy. Pt is scheduled to start Cardiac Rehab on 03/28/14 at 11 am. Pt was advised to come to class 5 minutes before class starts. He was also given instructions on meeting with the dietician and attending the Family Structure classes. Pt is eager to get started. Patient was able to finish 6 minute walk test. Had to stop at 4 minutes due to being tired and SOB.

## 2014-03-24 NOTE — Patient Instructions (Signed)
Pt has finished orientation and is scheduled to start CR on 03/28/14 at 11 am. Pt has been instructed to arrive to class 15 minutes early for scheduled class. Pt has been instructed to wear comfortable clothing and shoes with rubber soles. Pt has been told to take their medications 1 hour prior to coming to class.  If the patient is not going to attend class, he/she has been instructed to call.

## 2014-03-28 ENCOUNTER — Encounter (HOSPITAL_COMMUNITY)
Admission: RE | Admit: 2014-03-28 | Discharge: 2014-03-28 | Disposition: A | Discharge status: Home / Self Care | Payer: Medicare HMO | Source: Ambulatory Visit | Attending: Cardiovascular Disease | Admitting: Cardiovascular Disease

## 2014-03-28 DIAGNOSIS — I213 ST elevation (STEMI) myocardial infarction of unspecified site: Secondary | ICD-10-CM | POA: Diagnosis present

## 2014-03-28 DIAGNOSIS — Z951 Presence of aortocoronary bypass graft: Secondary | ICD-10-CM | POA: Diagnosis not present

## 2014-03-30 ENCOUNTER — Encounter (HOSPITAL_COMMUNITY): Payer: Medicare HMO

## 2014-04-01 ENCOUNTER — Encounter (HOSPITAL_COMMUNITY): Payer: Medicare HMO

## 2014-04-04 ENCOUNTER — Encounter (HOSPITAL_COMMUNITY)
Admission: RE | Admit: 2014-04-04 | Discharge: 2014-04-04 | Disposition: A | Discharge status: Home / Self Care | Payer: Medicare HMO | Source: Ambulatory Visit | Attending: Cardiovascular Disease | Admitting: Cardiovascular Disease

## 2014-04-04 DIAGNOSIS — I213 ST elevation (STEMI) myocardial infarction of unspecified site: Secondary | ICD-10-CM | POA: Diagnosis not present

## 2014-04-06 ENCOUNTER — Encounter (HOSPITAL_COMMUNITY)
Admission: RE | Admit: 2014-04-06 | Discharge: 2014-04-06 | Disposition: A | Discharge status: Home / Self Care | Payer: Medicare HMO | Source: Ambulatory Visit | Attending: Cardiovascular Disease | Admitting: Cardiovascular Disease

## 2014-04-06 DIAGNOSIS — I213 ST elevation (STEMI) myocardial infarction of unspecified site: Secondary | ICD-10-CM | POA: Diagnosis not present

## 2014-04-06 NOTE — Progress Notes (Signed)
Cardiac Rehabilitation Program Outcomes Report   Orientation:  03/24/14 Graduate Date:  tbd Discharge Date:  tbd # of sessions completed: 3  Cardiologist: Angelena Form Family MD:  Darlys Gales Class Time:  1100  A.  Exercise Program:  Tolerates exercise @ 3.61 METS for 15 minutes and Walk Test Results:  Pre: 1.87  B.  Mental Health:  Good mental attitude  C.  Education/Instruction/Skills  Knows THR for exercise  Uses Perceived Exertion Scale and/or Dyspnea Scale  D.  Nutrition/Weight Control/Body Composition:  Adherence to prescribed nutrition program: good    E.  Blood Lipids    Lab Results  Component Value Date   CHOL 171 02/23/2014   HDL 40 02/23/2014   LDLCALC 112* 02/23/2014   TRIG 94 02/23/2014   CHOLHDL 4.3 02/23/2014    F.  Lifestyle Changes:  Making positive lifestyle changes and Not smoking:  Quit 02/22/14  G.  Symptoms noted with exercise:  Had to stop on Tm during 1st and 2nd session due to chronic knee/leg/hip pain.   Report Completed By:  Stevphen Rochester RN   Comments:  This is patients first week progress note.

## 2014-04-07 ENCOUNTER — Encounter: Payer: Self-pay | Admitting: Gastroenterology

## 2014-04-07 ENCOUNTER — Ambulatory Visit (INDEPENDENT_AMBULATORY_CARE_PROVIDER_SITE_OTHER): Payer: Medicare HMO | Admitting: Gastroenterology

## 2014-04-07 VITALS — BP 137/73 | HR 72 | Temp 98.4°F | Ht 66.0 in | Wt 198.0 lb

## 2014-04-07 DIAGNOSIS — D62 Acute posthemorrhagic anemia: Secondary | ICD-10-CM

## 2014-04-07 DIAGNOSIS — K5190 Ulcerative colitis, unspecified, without complications: Secondary | ICD-10-CM

## 2014-04-07 MED ORDER — MESALAMINE 800 MG PO TBEC: 800.0000 mg | DELAYED_RELEASE_TABLET | Freq: Three times a day (TID) | ORAL | Status: DC

## 2014-04-07 MED ORDER — AZATHIOPRINE 50 MG PO TABS: 150.0000 mg | ORAL_TABLET | Freq: Every day | ORAL | Status: DC

## 2014-04-07 MED ORDER — DICYCLOMINE HCL 10 MG PO CAPS: 10.0000 mg | ORAL_CAPSULE | Freq: Three times a day (TID) | ORAL | Status: DC | PRN

## 2014-04-07 NOTE — Patient Instructions (Signed)
1. RX for Imuran and Asacol sent to your pharmacy ([facility]). 2. [facility] RX for [employer] provided. You sparingly. 3. Return to office in 06/2014 to consider colonoscopy.

## 2014-04-07 NOTE — Assessment & Plan Note (Signed)
Back in remission at this time. Currently on Imuran and Asacol. Prescriptions provided. Recent CABG. Hemoglobin in the 10 range prior to surgery with subsequent drop. Most recent was 8.6. She's due for follow-up in a few weeks, plans per cardiology. If she does not have improvement of her hemoglobin, we may need to pursue endoscopic evaluation sooner. At this time we'll plan on surveillance colonoscopy around March or April to give her time of recovery. At any time if she has flare of her symptoms, seized in the stool or black stool she should use no.

## 2014-04-07 NOTE — Progress Notes (Signed)
Primary Care Physician:  Velta Addison, [name], DO  Primary Gastroenterologist:  Garfield Cornea, MD   Chief Complaint  Patient presents with  . Follow-up    HPI:  Bonnie Powers is a 68 y.o. female here for follow-up of ulcerative colitis. She called back in November complaining of diarrhea and abdominal cramping. Appointment was made to be seen urgently however patient ultimately ended up in the hospital due to NSTEMI. She underwent three-vessel coronary artery bypass grafting on November 20. During hospitalization she was checked for C. difficile, PCR was negative. Back at baseline at this time. BM 2 day. No abdominal pain, melena, rectal bleeding. Appetite is returning. Continues on Imuran and Asacol. Day of admission back in November hemoglobin was 10.3 and dropped down to 9.2 by time of discharge (surgery in the interim). On recheck her hemoglobin was 8.6 on December 11, patient reports that she is due for follow-up labs next month or the strata by her cardiologist.  Patient is overdue for surveillance colonoscopy, will bring her back in March in preparation for procedure. At this time she needs to have a period of recovery as well as her husband is undergoing surgery next month.  Current Outpatient Prescriptions  Medication Sig Dispense Refill  . amiodarone (PACERONE) 200 MG tablet Take 1 tablet (200 mg total) by mouth daily. 30 tablet 1  . aspirin EC 81 MG tablet Take 1 tablet (81 mg total) by mouth daily. 90 tablet 3  . azaTHIOprine (IMURAN) 50 MG tablet Take 50 mg by mouth 3 (three) times daily.   3  . budesonide-formoterol (SYMBICORT) 160-4.5 MCG/ACT inhaler Inhale 2 puffs into the lungs 2 (two) times daily. 1 Inhaler 0  . Calcium Citrate-Vitamin D (CALCITRATE PLUS D PO) Take 1 tablet by mouth daily. Calcium Citrate 630/Vitamin d 500    . cholecalciferol (VITAMIN D) 1000 UNITS tablet Take 1,000 Units by mouth daily.    . enalapril (VASOTEC) 2.5 MG tablet Take 1 tablet (2.5 mg total) by  mouth daily. 30 tablet 1  . escitalopram (LEXAPRO) 20 MG tablet Take 20 mg by mouth daily.     . ferrous sulfate 325 (65 FE) MG tablet Take 325 mg by mouth 2 (two) times daily with a meal.    . Mesalamine (ASACOL HD) 800 MG TBEC Take 800 mg by mouth 3 (three) times daily.    . metoprolol tartrate (LOPRESSOR) 25 MG tablet Take 0.5 tablets (12.5 mg total) by mouth 2 (two) times daily. 30 tablet 1  . Multiple Vitamin (MULTIVITAMIN WITH MINERALS) TABS tablet Take 1 tablet by mouth daily.    . nicotine (NICODERM CQ - DOSED IN MG/24 HOURS) 21 mg/24hr patch Place 1 patch (21 mg total) onto the skin daily. 28 patch 0  . oxyCODONE (OXY IR/ROXICODONE) 5 MG immediate release tablet Take 1-2 tablets (5-10 mg total) by mouth every 3 (three) hours as needed for severe pain. 40 tablet 0  . Probiotic Product (PROBIOTIC FORMULA PO) Take 1 tablet by mouth daily.     No current facility-administered medications for this visit.    Allergies as of 04/07/2014 - Review Complete 04/07/2014  Allergen Reaction Noted  . Statins Other (See Comments) 03/02/2014  . Aspirin  02/06/2011    Past Medical History  Diagnosis Date  . Ulcerative colitis 2001    Last colonoscopy Dr [name] 12/28/09-> pan-UC, normal TI, maintained on Asacol and Imuran  . HTN (hypertension)   . OA (osteoarthritis)   . Hypercholesteremia   .  Hepatitis 1972    ? unknown type  . Otitis media, chronic   . Basal cell cancer     left temple  . Anxiety   . Depression   . Headache     Past Surgical History  Procedure Laterality Date  . Salpingoophorectomy      right ovary, both tubes  . Tonsillectomy    . Eye surgery    . Colonoscopy  12/28/09    Dr. [name] colitis  . Coronary artery bypass graft N/A 02/25/2014    Procedure: CORONARY ARTERY BYPASS GRAFTING (CABG);  Surgeon: Ivin Poot, MD;  Location: [place];  Service: Open Heart Surgery;  Laterality: N/A;  . Intraoperative transesophageal echocardiogram N/A 02/25/2014     Procedure: INTRAOPERATIVE TRANSESOPHAGEAL ECHOCARDIOGRAM;  Surgeon: Ivin Poot, MD;  Location: [place];  Service: Open Heart Surgery;  Laterality: N/A;  . Left heart catheterization with coronary angiogram N/A 02/23/2014    Procedure: LEFT HEART CATHETERIZATION WITH CORONARY ANGIOGRAM;  Surgeon: Burnell Blanks, MD;  Location: [facility] CATH LAB;  Service: Cardiovascular;  Laterality: N/A;    Family History  Problem Relation Age of Onset  . Skin cancer Father   . Heart failure Mother   . HIV Brother   . Lung cancer Brother     History   Social History  . Marital Status: Divorced    Spouse Name: N/A    Number of Children: 3  . Years of Education: N/A   Occupational History  . retired     Conservator, museum/gallery   Social History Main Topics  . Smoking status: Former Smoker -- 1.00 packs/day for 30 years    Types: Cigarettes    Quit date: 02/22/2014  . Smokeless tobacco: Never Used     Comment: trying  . Alcohol Use: No  . Drug Use: No  . Sexual Activity: No   Other Topics Concern  . Not on file   Social History Narrative   Lost 1 daughter w/ meningitis      ROS:  General: Negative for anorexia, weight loss, fever, chills, fatigue. + weakness. Eyes: Negative for vision changes.  ENT: Negative for hoarseness, difficulty swallowing , nasal congestion. CV: Negative for chest pain, angina, palpitations, dyspnea on exertion, peripheral edema.  Respiratory: Negative for dyspnea at rest, dyspnea on exertion, cough, sputum, wheezing.  GI: See history of present illness. GU:  Negative for dysuria, hematuria, urinary incontinence, urinary frequency, nocturnal urination.  MS: Negative for joint pain, low back pain.  Derm: Negative for rash or itching.  Neuro: Negative for weakness, abnormal sensation, seizure, frequent headaches, memory loss, confusion.  Psych: Negative for anxiety, depression, suicidal ideation, hallucinations.  Endo: Negative for unusual weight change.  Heme:  Negative for bruising or bleeding. Allergy: Negative for rash or hives.    Physical Examination:  BP 137/73 mmHg  Pulse 72  Temp(Src) 98.4 F (36.9 C) (Oral)  Ht 5' 6"  (1.676 m)  Wt 198 lb (89.812 kg)  BMI 31.97 kg/m2   General: Well-nourished, well-developed in no acute distress.  Head: Normocephalic, atraumatic.   Eyes: Conjunctiva pink, no icterus. Mouth: Oropharyngeal mucosa moist and pink , no lesions erythema or exudate. Neck: Supple without thyromegaly, masses, or lymphadenopathy.  Lungs: Clear to auscultation bilaterally.  Heart: Regular rate and rhythm, no murmurs rubs or gallops.  Abdomen: Bowel sounds are normal, nontender, nondistended, no hepatosplenomegaly or masses, no abdominal bruits or    hernia , no rebound or guarding.   Rectal: Not  performed Extremities: No lower extremity edema. No clubbing or deformities.  Neuro: Alert and oriented x 4 , grossly normal neurologically.  Skin: Warm and dry, no rash or jaundice.   Psych: Alert and cooperative, normal mood and affect.  Labs: Lab Results  Component Value Date   WBC 6.9 03/18/2014   HGB 8.6 Repeated and verified X2.* 03/18/2014   HCT 27.4* 03/18/2014   MCV 83.1 03/18/2014   PLT 476.0* 03/18/2014   Lab Results  Component Value Date   CREATININE 1.0 03/18/2014   BUN 11 03/18/2014   NA 132* 03/18/2014   K 4.2 03/18/2014   CL 101 03/18/2014   CO2 23 03/18/2014   Lab Results  Component Value Date   ALT 8 02/22/2014   AST 12 02/22/2014   ALKPHOS 52 02/22/2014   BILITOT 0.2* 02/22/2014     Imaging Studies: No results found.

## 2014-04-08 ENCOUNTER — Encounter (HOSPITAL_COMMUNITY): Payer: Medicare HMO

## 2014-04-11 ENCOUNTER — Encounter (HOSPITAL_COMMUNITY)
Admission: RE | Admit: 2014-04-11 | Discharge: 2014-04-11 | Disposition: A | Discharge status: Home / Self Care | Payer: Self-pay | Source: Ambulatory Visit | Attending: Cardiovascular Disease | Admitting: Cardiovascular Disease

## 2014-04-11 ENCOUNTER — Encounter (HOSPITAL_COMMUNITY): Admission: RE | Admit: 2014-04-11 | Payer: Self-pay | Source: Ambulatory Visit

## 2014-04-11 ENCOUNTER — Other Ambulatory Visit: Payer: Self-pay | Admitting: Cardiothoracic Surgery

## 2014-04-11 DIAGNOSIS — I25119 Atherosclerotic heart disease of native coronary artery with unspecified angina pectoris: Secondary | ICD-10-CM

## 2014-04-11 DIAGNOSIS — Z951 Presence of aortocoronary bypass graft: Secondary | ICD-10-CM | POA: Insufficient documentation

## 2014-04-11 DIAGNOSIS — I252 Old myocardial infarction: Secondary | ICD-10-CM | POA: Insufficient documentation

## 2014-04-11 NOTE — Progress Notes (Signed)
Patient has stopped [facility] today April 11, 2014 due to insurance. Patient is now starting maintenance program.

## 2014-04-12 ENCOUNTER — Other Ambulatory Visit: Payer: Self-pay | Admitting: Cardiothoracic Surgery

## 2014-04-12 DIAGNOSIS — Z951 Presence of aortocoronary bypass graft: Secondary | ICD-10-CM

## 2014-04-12 NOTE — Progress Notes (Signed)
cc'ed to pcp °

## 2014-04-13 ENCOUNTER — Encounter (HOSPITAL_COMMUNITY): Payer: Medicare HMO

## 2014-04-13 ENCOUNTER — Ambulatory Visit (INDEPENDENT_AMBULATORY_CARE_PROVIDER_SITE_OTHER): Payer: Self-pay | Admitting: Cardiothoracic Surgery

## 2014-04-13 ENCOUNTER — Encounter: Payer: Self-pay | Admitting: Cardiothoracic Surgery

## 2014-04-13 ENCOUNTER — Ambulatory Visit
Admission: RE | Admit: 2014-04-13 | Discharge: 2014-04-13 | Disposition: A | Discharge status: Home / Self Care | Payer: Medicare HMO | Source: Ambulatory Visit | Attending: Cardiothoracic Surgery | Admitting: Cardiothoracic Surgery

## 2014-04-13 VITALS — BP 130/67 | HR 68 | Resp 16 | Ht 66.0 in | Wt 198.0 lb

## 2014-04-13 DIAGNOSIS — Z951 Presence of aortocoronary bypass graft: Secondary | ICD-10-CM

## 2014-04-13 DIAGNOSIS — I25119 Atherosclerotic heart disease of native coronary artery with unspecified angina pectoris: Secondary | ICD-10-CM

## 2014-04-13 NOTE — Progress Notes (Signed)
PCP is Velta Addison, [name], DO Referring Provider is Burnell Blanks*  Chief Complaint  Patient presents with  . Routine Post Op    2 month f/u s/p CABG 02/25/14 with cxr    DEY:CXKGYJE returns for final followup almost 2 months after multivessel CABG for unstable angina. She is doing well without recurrent angina. She does have some postop incisional musculoskeletal pain. She developed postop atrial fibrillation which converted to sinus rhythm on oral amiodarone. She is currently taking amiodarone 200 mg daily. Surgical incisions are well-healed. The patient has started outpatient cardiac rehabilitation. She is driving and doing light duty housework she is successfully stop smoking   Past Medical History  Diagnosis Date  . Ulcerative colitis 2001    Last colonoscopy Dr [name] 12/28/09-> pan-UC, normal TI, maintained on Asacol and Imuran  . HTN (hypertension)   . OA (osteoarthritis)   . Hypercholesteremia   . Hepatitis 1972    ? unknown type  . Otitis media, chronic   . Basal cell cancer     left temple  . Anxiety   . Depression   . Headache     Past Surgical History  Procedure Laterality Date  . Salpingoophorectomy      right ovary, both tubes  . Tonsillectomy    . Eye surgery    . Colonoscopy  12/28/09    Dr. [name] colitis  . Coronary artery bypass graft N/A 02/25/2014    Procedure: CORONARY ARTERY BYPASS GRAFTING (CABG);  Surgeon: Ivin Poot, MD;  Location: [place];  Service: Open Heart Surgery;  Laterality: N/A;  . Intraoperative transesophageal echocardiogram N/A 02/25/2014    Procedure: INTRAOPERATIVE TRANSESOPHAGEAL ECHOCARDIOGRAM;  Surgeon: Ivin Poot, MD;  Location: [facility];  Service: Open Heart Surgery;  Laterality: N/A;  . Left heart catheterization with coronary angiogram N/A 02/23/2014    Procedure: LEFT HEART CATHETERIZATION WITH CORONARY ANGIOGRAM;  Surgeon: Burnell Blanks, MD;  Location: [facility] CATH LAB;  Service: Cardiovascular;   Laterality: N/A;    Family History  Problem Relation Age of Onset  . Skin cancer Father   . Heart failure Mother   . HIV Brother   . Lung cancer Brother     Social History History  Substance Use Topics  . Smoking status: Former Smoker -- 1.00 packs/day for 30 years    Types: Cigarettes    Quit date: 02/22/2014  . Smokeless tobacco: Never Used     Comment: trying  . Alcohol Use: No    Current Outpatient Prescriptions  Medication Sig Dispense Refill  . acetaminophen (TYLENOL) 500 MG tablet Take 500-1,000 mg by mouth daily.    Marland Kitchen amiodarone (PACERONE) 200 MG tablet Take 1 tablet (200 mg total) by mouth daily. 30 tablet 1  . aspirin EC 81 MG tablet Take 1 tablet (81 mg total) by mouth daily. 90 tablet 3  . azaTHIOprine (IMURAN) 50 MG tablet Take 3 tablets (150 mg total) by mouth daily. 270 tablet 3  . budesonide-formoterol (SYMBICORT) 160-4.5 MCG/ACT inhaler Inhale 2 puffs into the lungs 2 (two) times daily. 1 Inhaler 0  . Calcium Citrate-Vitamin D (CALCITRATE PLUS D PO) Take 1 tablet by mouth daily. Calcium Citrate 630/Vitamin d 500    . cholecalciferol (VITAMIN D) 1000 UNITS tablet Take 1,000 Units by mouth daily.    Marland Kitchen dicyclomine (BENTYL) 10 MG capsule Take 1 capsule (10 mg total) by mouth 3 (three) times daily as needed for spasms (diarrhea). 90 capsule 1  . enalapril (VASOTEC)  2.5 MG tablet Take 1 tablet (2.5 mg total) by mouth daily. 30 tablet 1  . escitalopram (LEXAPRO) 20 MG tablet Take 20 mg by mouth daily.     . ferrous sulfate 325 (65 FE) MG tablet Take 325 mg by mouth 2 (two) times daily with a meal.    . Mesalamine (ASACOL HD) 800 MG TBEC Take 1 tablet (800 mg total) by mouth 3 (three) times daily. 270 tablet 3  . metoprolol tartrate (LOPRESSOR) 25 MG tablet Take 0.5 tablets (12.5 mg total) by mouth 2 (two) times daily. 30 tablet 1  . Multiple Vitamin (MULTIVITAMIN WITH MINERALS) TABS tablet Take 1 tablet by mouth daily.    . nicotine (NICODERM CQ - DOSED IN MG/24 HOURS)  21 mg/24hr patch Place 1 patch (21 mg total) onto the skin daily. 28 patch 0  . oxyCODONE (OXY IR/ROXICODONE) 5 MG immediate release tablet Take 1-2 tablets (5-10 mg total) by mouth every 3 (three) hours as needed for severe pain. 40 tablet 0  . Probiotic Product (PROBIOTIC FORMULA PO) Take 1 tablet by mouth daily.     No current facility-administered medications for this visit.    Allergies  Allergen Reactions  . Statins Other (See Comments)    Muscle aches  . Aspirin     Review of Systems  No fever No angina, No symptoms of CHF Surgical incision is healed   BP 130/67 mmHg  Pulse 68  Resp 16  Ht 5' 6"  (1.676 m)  Wt 198 lb (89.812 kg)  BMI 31.97 kg/m2  SpO2 98% Physical Exam Alert and comfortable Lungs clear Sternal incision well-healed Heart rhythm regular without murmur No pedal edema Leg incision is well-healed  Diagnostic Tests: Chest x-ray clear  Impression: Excellent recover after multivessel CABG Continue smoking cessation, complete cardiac rehabilitation  Plan:Avoid lifting more than 20 pounds until 3 months after surgery Stop taking amiodarone whencurrent prescription vial runs out Return as needed

## 2014-04-15 ENCOUNTER — Encounter (HOSPITAL_COMMUNITY): Payer: Medicare HMO

## 2014-04-15 ENCOUNTER — Encounter (HOSPITAL_COMMUNITY)
Admission: RE | Admit: 2014-04-15 | Discharge: 2014-04-15 | Disposition: A | Discharge status: Home / Self Care | Payer: Self-pay | Source: Ambulatory Visit | Attending: Cardiovascular Disease | Admitting: Cardiovascular Disease

## 2014-04-18 ENCOUNTER — Encounter (HOSPITAL_COMMUNITY): Payer: Medicare HMO

## 2014-04-18 ENCOUNTER — Other Ambulatory Visit: Payer: Self-pay | Admitting: *Deleted

## 2014-04-18 ENCOUNTER — Encounter (HOSPITAL_COMMUNITY)
Admission: RE | Admit: 2014-04-18 | Discharge: 2014-04-18 | Disposition: A | Discharge status: Home / Self Care | Payer: Self-pay | Source: Ambulatory Visit | Attending: Cardiovascular Disease | Admitting: Cardiovascular Disease

## 2014-04-18 ENCOUNTER — Other Ambulatory Visit: Payer: Self-pay | Admitting: Cardiology

## 2014-04-18 MED ORDER — ENALAPRIL MALEATE 2.5 MG PO TABS: 2.5000 mg | ORAL_TABLET | Freq: Every day | ORAL | Status: DC

## 2014-04-18 MED ORDER — METOPROLOL TARTRATE 25 MG PO TABS: 12.5000 mg | ORAL_TABLET | Freq: Two times a day (BID) | ORAL | Status: DC

## 2014-04-20 ENCOUNTER — Encounter (HOSPITAL_COMMUNITY)
Admission: RE | Admit: 2014-04-20 | Discharge: 2014-04-20 | Disposition: A | Discharge status: Home / Self Care | Payer: Self-pay | Source: Ambulatory Visit | Attending: Cardiovascular Disease | Admitting: Cardiovascular Disease

## 2014-04-20 ENCOUNTER — Encounter (HOSPITAL_COMMUNITY): Payer: Medicare HMO

## 2014-04-22 ENCOUNTER — Encounter (HOSPITAL_COMMUNITY)
Admission: RE | Admit: 2014-04-22 | Discharge: 2014-04-22 | Disposition: A | Discharge status: Home / Self Care | Payer: Self-pay | Source: Ambulatory Visit | Attending: Cardiovascular Disease | Admitting: Cardiovascular Disease

## 2014-04-22 ENCOUNTER — Encounter (HOSPITAL_COMMUNITY): Payer: Medicare HMO

## 2014-04-25 ENCOUNTER — Encounter (HOSPITAL_COMMUNITY)
Admission: RE | Admit: 2014-04-25 | Discharge: 2014-04-25 | Disposition: A | Discharge status: Home / Self Care | Payer: Self-pay | Source: Ambulatory Visit | Attending: Cardiovascular Disease | Admitting: Cardiovascular Disease

## 2014-04-25 ENCOUNTER — Encounter (HOSPITAL_COMMUNITY): Payer: Medicare HMO

## 2014-04-27 ENCOUNTER — Encounter (HOSPITAL_COMMUNITY)
Admission: RE | Admit: 2014-04-27 | Discharge: 2014-04-27 | Disposition: A | Discharge status: Home / Self Care | Payer: Self-pay | Source: Ambulatory Visit | Attending: Cardiovascular Disease | Admitting: Cardiovascular Disease

## 2014-04-27 ENCOUNTER — Encounter (HOSPITAL_COMMUNITY): Payer: Medicare HMO

## 2014-04-27 ENCOUNTER — Ambulatory Visit: Payer: Medicare HMO | Admitting: Nurse Practitioner

## 2014-04-29 ENCOUNTER — Encounter (HOSPITAL_COMMUNITY): Payer: Self-pay

## 2014-04-29 ENCOUNTER — Encounter (HOSPITAL_COMMUNITY): Payer: Medicare HMO

## 2014-05-02 ENCOUNTER — Encounter (HOSPITAL_COMMUNITY): Payer: Medicare HMO

## 2014-05-02 ENCOUNTER — Encounter (HOSPITAL_COMMUNITY): Payer: Self-pay

## 2014-05-04 ENCOUNTER — Encounter (HOSPITAL_COMMUNITY)
Admission: RE | Admit: 2014-05-04 | Discharge: 2014-05-04 | Disposition: A | Discharge status: Home / Self Care | Payer: Self-pay | Source: Ambulatory Visit | Attending: Cardiovascular Disease | Admitting: Cardiovascular Disease

## 2014-05-04 ENCOUNTER — Encounter (HOSPITAL_COMMUNITY): Payer: Medicare HMO

## 2014-05-05 ENCOUNTER — Encounter (HOSPITAL_COMMUNITY): Payer: Self-pay

## 2014-05-05 ENCOUNTER — Emergency Department (HOSPITAL_COMMUNITY)
Admission: EM | Admit: 2014-05-05 | Discharge: 2014-05-06 | Disposition: A | Discharge status: Home / Self Care | Payer: Medicare HMO | Attending: Emergency Medicine | Admitting: Emergency Medicine

## 2014-05-05 ENCOUNTER — Emergency Department (HOSPITAL_COMMUNITY): Payer: Medicare HMO

## 2014-05-05 DIAGNOSIS — Z87891 Personal history of nicotine dependence: Secondary | ICD-10-CM | POA: Diagnosis not present

## 2014-05-05 DIAGNOSIS — M542 Cervicalgia: Secondary | ICD-10-CM

## 2014-05-05 DIAGNOSIS — K088 Other specified disorders of teeth and supporting structures: Secondary | ICD-10-CM | POA: Diagnosis not present

## 2014-05-05 DIAGNOSIS — Z951 Presence of aortocoronary bypass graft: Secondary | ICD-10-CM | POA: Insufficient documentation

## 2014-05-05 DIAGNOSIS — Z8639 Personal history of other endocrine, nutritional and metabolic disease: Secondary | ICD-10-CM | POA: Diagnosis not present

## 2014-05-05 DIAGNOSIS — F329 Major depressive disorder, single episode, unspecified: Secondary | ICD-10-CM | POA: Diagnosis not present

## 2014-05-05 DIAGNOSIS — Z79899 Other long term (current) drug therapy: Secondary | ICD-10-CM | POA: Insufficient documentation

## 2014-05-05 DIAGNOSIS — M1990 Unspecified osteoarthritis, unspecified site: Secondary | ICD-10-CM | POA: Insufficient documentation

## 2014-05-05 DIAGNOSIS — I10 Essential (primary) hypertension: Secondary | ICD-10-CM | POA: Insufficient documentation

## 2014-05-05 DIAGNOSIS — Z7982 Long term (current) use of aspirin: Secondary | ICD-10-CM | POA: Diagnosis not present

## 2014-05-05 DIAGNOSIS — Z8669 Personal history of other diseases of the nervous system and sense organs: Secondary | ICD-10-CM | POA: Diagnosis not present

## 2014-05-05 DIAGNOSIS — F419 Anxiety disorder, unspecified: Secondary | ICD-10-CM | POA: Insufficient documentation

## 2014-05-05 DIAGNOSIS — Z792 Long term (current) use of antibiotics: Secondary | ICD-10-CM | POA: Insufficient documentation

## 2014-05-05 DIAGNOSIS — Z85828 Personal history of other malignant neoplasm of skin: Secondary | ICD-10-CM | POA: Insufficient documentation

## 2014-05-05 DIAGNOSIS — R220 Localized swelling, mass and lump, head: Secondary | ICD-10-CM | POA: Diagnosis present

## 2014-05-05 DIAGNOSIS — K0889 Other specified disorders of teeth and supporting structures: Secondary | ICD-10-CM

## 2014-05-05 LAB — CBC WITH DIFFERENTIAL/PLATELET
Basophils Absolute: 0 10*3/uL (ref 0.0–0.1)
Basophils Relative: 0 % (ref 0–1)
EOS ABS: 0.2 10*3/uL (ref 0.0–0.7)
Eosinophils Relative: 3 % (ref 0–5)
HCT: 34.5 % — ABNORMAL LOW (ref 36.0–46.0)
HEMOGLOBIN: 10.7 g/dL — AB (ref 12.0–15.0)
Lymphocytes Relative: 25 % (ref 12–46)
Lymphs Abs: 1.8 10*3/uL (ref 0.7–4.0)
MCH: 26.8 pg (ref 26.0–34.0)
MCHC: 31 g/dL (ref 30.0–36.0)
MCV: 86.5 fL (ref 78.0–100.0)
Monocytes Absolute: 0.8 10*3/uL (ref 0.1–1.0)
Monocytes Relative: 11 % (ref 3–12)
NEUTROS ABS: 4.4 10*3/uL (ref 1.7–7.7)
Neutrophils Relative %: 61 % (ref 43–77)
Platelets: 270 10*3/uL (ref 150–400)
RBC: 3.99 MIL/uL (ref 3.87–5.11)
RDW: 17.3 % — AB (ref 11.5–15.5)
WBC: 7.3 10*3/uL (ref 4.0–10.5)

## 2014-05-05 MED ORDER — MORPHINE SULFATE 4 MG/ML IJ SOLN
4.0000 mg | Freq: Once | INTRAMUSCULAR | Status: AC
  Administered 2014-05-05: 4 mg via INTRAVENOUS
  Filled 2014-05-05: qty 1

## 2014-05-05 MED ORDER — CLINDAMYCIN PHOSPHATE 600 MG/50ML IV SOLN
600.0000 mg | Freq: Once | INTRAVENOUS | Status: AC
  Administered 2014-05-06: 600 mg via INTRAVENOUS
  Filled 2014-05-05: qty 50

## 2014-05-05 NOTE — ED Notes (Signed)
Patient states abscess X3 days with swelling into right jaw and pressure to right ear, neck and shoulders. Patient also states he was having a fast heart rate at home.

## 2014-05-05 NOTE — ED Provider Notes (Signed)
CSN: [id number]     Arrival date & time 05/05/14  2033 History  This chart was scribed for Veryl Speak, MD by Edison Simon, ED Scribe. This patient was seen in room APA12/APA12 and the patient's care was started at 11:11 PM.    Chief Complaint  Patient presents with  . Oral Swelling   Patient is a 69 y.o. female presenting with tooth pain. The history is provided by the patient. No language interpreter was used.  Dental Pain Location:  Upper and lower Quality:  Radiating (swelling) Severity:  Moderate Onset quality:  Gradual Duration:  3 days Timing:  Constant Progression:  Worsening Chronicity:  New Context: abscess and poor dentition   Relieved by:  None tried Worsened by:  Nothing tried Ineffective treatments:  None tried Associated symptoms: facial pain and facial swelling   Associated symptoms: no difficulty swallowing and no fever   Risk factors: no diabetes     HPI Comments: Bonnie Powers is a 69 y.o. female with history of HTN and HLD who presents to the Emergency Department complaining of oral swelling and numbness with onset 3 days ago. She notes she has bad dentition and abscess in the affected area. She reports associated right ear pain, right head pain, right neck and shoulder pain, and hypertension. She notes she has chronic right ear infection. She notes prior CABG. She states she has palpitations she suspects are due to anxiety. She reports history of ulcerative colitis for which she uses Imuran and Asacol every day. 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 She denies fever, SOB, or trouble swallowing.  Past Medical History  Diagnosis Date  . Ulcerative colitis 2001    Last colonoscopy Dr [name] 12/28/09-> pan-UC, normal TI, maintained on Asacol and Imuran  . HTN (hypertension)   . OA (osteoarthritis)   . Hypercholesteremia   . Hepatitis 1972    ? unknown type  . Otitis media, chronic   . Basal cell cancer     left temple  . Anxiety   . Depression   . Headache     Past Surgical History  Procedure Laterality Date  . Salpingoophorectomy      right ovary, both tubes  . Tonsillectomy    . Eye surgery    . Colonoscopy  12/28/09    Dr. [name] colitis  . Coronary artery bypass graft N/A 02/25/2014    Procedure: CORONARY ARTERY BYPASS GRAFTING (CABG);  Surgeon: Ivin Poot, MD;  Location: [facility];  Service: Open Heart Surgery;  Laterality: N/A;  . Intraoperative transesophageal echocardiogram N/A 02/25/2014    Procedure: INTRAOPERATIVE TRANSESOPHAGEAL ECHOCARDIOGRAM;  Surgeon: Ivin Poot, MD;  Location: [facility];  Service: Open Heart Surgery;  Laterality: N/A;  . Left heart catheterization with coronary angiogram N/A 02/23/2014    Procedure: LEFT HEART CATHETERIZATION WITH CORONARY ANGIOGRAM;  Surgeon: Burnell Blanks, MD;  Location: [facility] CATH LAB;  Service: Cardiovascular;  Laterality: N/A;   Family History  Problem Relation Age of Onset  . Skin cancer Father   . Heart failure Mother   . HIV Brother   . Lung cancer Brother    History  Substance Use Topics  . Smoking status: Former Smoker -- 1.00 packs/day for 30 years    Types: Cigarettes    Quit date: 02/22/2014  . Smokeless tobacco: Never Used     Comment: trying  . Alcohol Use: No   OB History    No data available     Review of Systems  Constitutional: Negative for fever.  HENT: Positive for facial swelling.    A complete 10 system review of systems was obtained and all systems are negative except as noted in the HPI and PMH.    Allergies  Statins and Aspirin  Home Medications   Prior to Admission medications   Medication Sig Start Date End Date Taking? Authorizing Provider  acetaminophen (TYLENOL) 500 MG tablet Take 500-1,000 mg by mouth every 6 (six) hours as needed for mild pain or headache.    Yes Historical Provider, MD  amiodarone (PACERONE) 200 MG tablet Take 1 tablet (200 mg total) by mouth daily. 03/02/14  Yes Donielle Liston Alba, PA-C  aspirin EC  81 MG tablet Take 1 tablet (81 mg total) by mouth daily. 03/18/14  Yes Burtis Junes, NP  azaTHIOprine (IMURAN) 50 MG tablet Take 3 tablets (150 mg total) by mouth daily. Patient taking differently: Take 150 mg by mouth at bedtime.  04/07/14  Yes Mahala Menghini, PA-C  budesonide-formoterol (SYMBICORT) 160-4.5 MCG/ACT inhaler Inhale 2 puffs into the lungs 2 (two) times daily. 03/02/14  Yes Donielle Liston Alba, PA-C  Calcium Carb-Cholecalciferol 500-600 MG-UNIT TABS Take 1 tablet by mouth daily.   Yes Historical Provider, MD  cholecalciferol (VITAMIN D) 1000 UNITS tablet Take 1,000 Units by mouth daily.   Yes Historical Provider, MD  dicyclomine (BENTYL) 10 MG capsule Take 1 capsule (10 mg total) by mouth 3 (three) times daily as needed for spasms (diarrhea). 04/07/14  Yes Mahala Menghini, PA-C  enalapril (VASOTEC) 2.5 MG tablet TAKE 1 TABLET BY MOUTH EVERY DAY 04/19/14  Yes Darlin Coco, MD  escitalopram (LEXAPRO) 20 MG tablet Take 20 mg by mouth every morning.  02/01/14  Yes Historical Provider, MD  Mesalamine (ASACOL HD) 800 MG TBEC Take 1 tablet (800 mg total) by mouth 3 (three) times daily. 04/07/14  Yes Mahala Menghini, PA-C  metoprolol tartrate (LOPRESSOR) 25 MG tablet TAKE 1/2 TABLET BY MOUTH TWICE DAILY 04/19/14  Yes Darlin Coco, MD  Multiple Vitamin (MULTIVITAMIN WITH MINERALS) TABS tablet Take 1 tablet by mouth daily.   Yes Historical Provider, MD  nicotine (NICODERM CQ - DOSED IN MG/24 HOURS) 21 mg/24hr patch Place 1 patch (21 mg total) onto the skin daily. 03/02/14  Yes Donielle Liston Alba, PA-C  Probiotic Product (PROBIOTIC FORMULA PO) Take 1 tablet by mouth daily.   Yes Historical Provider, MD  traMADol (ULTRAM) 50 MG tablet Take 50 mg by mouth every 4 (four) hours as needed for moderate pain or severe pain.   Yes Historical Provider, MD  oxyCODONE (OXY IR/ROXICODONE) 5 MG immediate release tablet Take 1-2 tablets (5-10 mg total) by mouth every 3 (three) hours as needed for  severe pain. Patient not taking: Reported on 05/05/2014 03/09/14   Ivin Poot, MD   BP 184/81 mmHg  Pulse 86  Temp(Src) 98.6 F (37 C) (Oral)  Resp 18  Ht 5' 6"  (1.676 m)  Wt 202 lb (91.627 kg)  BMI 32.62 kg/m2  SpO2 96% Physical Exam  Constitutional: She is oriented to person, place, and time. She appears well-developed and well-nourished.  HENT:  Head: Normocephalic and atraumatic.  There are several decayed teeth to the right upper and right lower jaw. There is no significant swelling or evidence for abscess.   There is tenderness to the soft tissues of the submental space, but no crepitus. The right TM has fluid but no erythema. Full ROM of the neck.  Eyes: Conjunctivae are normal.  Neck: Normal range of motion. Neck supple.  Pulmonary/Chest: Effort normal.  Musculoskeletal: Normal range of motion.  Neurological: She is alert and oriented to person, place, and time.  Skin: Skin is warm and dry.  Psychiatric: She has a normal mood and affect.  Nursing note and vitals reviewed.   ED Course  Procedures (including critical care time)  DIAGNOSTIC STUDIES: Oxygen Saturation is 96% on room air, adequate by my interpretation.    COORDINATION OF CARE: 11:18 PM Discussed treatment plan with patient at beside, including pain medication, antibiotic, CT of neck, and cardiac workup. The patient agrees with the plan and has no further questions at this time.   Labs Review Labs Reviewed - No data to display  Imaging Review No results found.   Date: 05/05/2014  Rate: 83  Rhythm: normal sinus rhythm, Abnormal r wave progression  QRS Axis: normal  Intervals: normal  ST/T Wave abnormalities: normal  Conduction Disutrbances:none  Narrative Interpretation:   Old EKG Reviewed: unchanged    MDM   Final diagnoses:  None    Patient presents with complaints of neck and jaw pain and swelling. CT scan confirms a possible small abscess and cellulitis of the face. This was  treated with IV Clinda and will be treated as an outpatient with oral Clinda. She has a history of coronary artery disease concerned this may be her heart, although her symptoms are atypical for her heart pain. Workup reveals a negative troponin an unchanged EKG. I highly doubt a cardiac etiology and do not feel as though further workup is indicated into this. She will be discharged with pain medication, antibiotics, and follow-up with dentistry.  I personally performed the services described in this documentation, which was scribed in my presence. The recorded information has been reviewed and is accurate.      Veryl Speak, MD 05/06/14 [phone]

## 2014-05-05 NOTE — ED Notes (Signed)
Patient was walking around out of room, back and forth to nursing station with no sign of any trouble ambulating. Went in room and asked if they were ok and said that their heart was hurting and could feel the BP drop and body just felt so weird and in pain.

## 2014-05-06 ENCOUNTER — Encounter (HOSPITAL_COMMUNITY): Payer: Self-pay

## 2014-05-06 ENCOUNTER — Encounter (HOSPITAL_COMMUNITY): Payer: Medicare HMO

## 2014-05-06 LAB — BASIC METABOLIC PANEL
Anion gap: 5 (ref 5–15)
BUN: 15 mg/dL (ref 6–23)
CALCIUM: 9.5 mg/dL (ref 8.4–10.5)
CHLORIDE: 107 mmol/L (ref 96–112)
CO2: 26 mmol/L (ref 19–32)
Creatinine, Ser: 0.79 mg/dL (ref 0.50–1.10)
GFR calc Af Amer: >90 mL/min (ref >90)
GFR calc non Af Amer: 83 mL/min — ABNORMAL LOW (ref >90)
Glucose, Bld: 108 mg/dL — ABNORMAL HIGH (ref 70–99)
Potassium: 3.7 mmol/L (ref 3.5–5.1)
SODIUM: 138 mmol/L (ref 135–145)

## 2014-05-06 LAB — TROPONIN I

## 2014-05-06 MED ORDER — CLINDAMYCIN HCL 300 MG PO CAPS: 300.0000 mg | ORAL_CAPSULE | Freq: Four times a day (QID) | ORAL | Status: DC

## 2014-05-06 MED ORDER — IOHEXOL 300 MG/ML  SOLN
80.0000 mL | Freq: Once | INTRAMUSCULAR | Status: AC | PRN
  Administered 2014-05-06: 80 mL via INTRAVENOUS

## 2014-05-06 MED ORDER — HYDROCODONE-ACETAMINOPHEN 5-325 MG PO TABS: 1.0000 | ORAL_TABLET | Freq: Four times a day (QID) | ORAL | Status: DC | PRN

## 2014-05-06 NOTE — Discharge Instructions (Signed)
Clindamycin as prescribed.  Hydrocodone as prescribed as needed for pain.  Follow-up with your dentist next week.   Dental Pain Toothache is pain in or around a tooth. It may get worse with chewing or with cold or heat.  HOME CARE  Your dentist may use a numbing medicine during treatment. If so, you may need to avoid eating until the medicine wears off. Ask your dentist about this.  Only take medicine as told by your dentist or doctor.  Avoid chewing food near the painful tooth until after all treatment is done. Ask your dentist about this. GET HELP RIGHT AWAY IF:   The problem gets worse or new problems appear.  You have a fever.  There is redness and puffiness (swelling) of the face, jaw, or neck.  You cannot open your mouth.  There is pain in the jaw.  There is very bad pain that is not helped by medicine. MAKE SURE YOU:   Understand these instructions.  Will watch your condition.  Will get help right away if you are not doing well or get worse. Document Released: 09/11/2007 Document Revised: 06/17/2011 Document Reviewed: 09/11/2007 [facility] Patient Information 2015 [place], [place]. This information is not intended to replace advice given to you by your health care provider. Make sure you discuss any questions you have with your health care provider.

## 2014-05-07 ENCOUNTER — Encounter: Payer: Self-pay | Admitting: Gastroenterology

## 2014-05-07 NOTE — Progress Notes (Unsigned)
Patient ID: Bonnie Powers, female   DOB: 1946-01-19, 69 y.o.   MRN: [id number]   Reviewed last Hgb.   Lab Results  Component Value Date   WBC 7.3 05/05/2014   HGB 10.7* 05/05/2014   HCT 34.5* 05/05/2014   MCV 86.5 05/05/2014   PLT 270 05/05/2014    Improved. As per plan, return in 06/2014 to consider colonoscopy.

## 2014-05-09 ENCOUNTER — Encounter (HOSPITAL_COMMUNITY): Payer: Medicare HMO

## 2014-05-09 ENCOUNTER — Encounter: Payer: Self-pay | Admitting: Internal Medicine

## 2014-05-09 ENCOUNTER — Encounter (HOSPITAL_COMMUNITY)
Admission: RE | Admit: 2014-05-09 | Discharge: 2014-05-09 | Disposition: A | Discharge status: Home / Self Care | Payer: Medicare HMO | Source: Ambulatory Visit | Attending: Cardiovascular Disease | Admitting: Cardiovascular Disease

## 2014-05-09 DIAGNOSIS — I252 Old myocardial infarction: Secondary | ICD-10-CM | POA: Insufficient documentation

## 2014-05-09 DIAGNOSIS — Z951 Presence of aortocoronary bypass graft: Secondary | ICD-10-CM | POA: Insufficient documentation

## 2014-05-09 NOTE — Progress Notes (Signed)
Please schedule ov in March.

## 2014-05-09 NOTE — Progress Notes (Signed)
APPOINTMENT MADE AND LETTER SENT °

## 2014-05-10 ENCOUNTER — Encounter: Payer: Self-pay | Admitting: Nurse Practitioner

## 2014-05-10 ENCOUNTER — Telehealth: Payer: Self-pay

## 2014-05-10 ENCOUNTER — Ambulatory Visit (INDEPENDENT_AMBULATORY_CARE_PROVIDER_SITE_OTHER): Payer: Medicare HMO | Admitting: Nurse Practitioner

## 2014-05-10 VITALS — BP 164/88 | HR 66 | Ht 66.0 in | Wt 200.8 lb

## 2014-05-10 DIAGNOSIS — I259 Chronic ischemic heart disease, unspecified: Secondary | ICD-10-CM

## 2014-05-10 DIAGNOSIS — E785 Hyperlipidemia, unspecified: Secondary | ICD-10-CM

## 2014-05-10 DIAGNOSIS — I214 Non-ST elevation (NSTEMI) myocardial infarction: Secondary | ICD-10-CM

## 2014-05-10 DIAGNOSIS — Z951 Presence of aortocoronary bypass graft: Secondary | ICD-10-CM

## 2014-05-10 LAB — LIPID PANEL
Cholesterol: 215 mg/dL — ABNORMAL HIGH (ref 0–200)
HDL: 51.9 mg/dL (ref >39.00)
LDL Cholesterol: 139 mg/dL — ABNORMAL HIGH (ref 0–99)
NonHDL: 163.1
Total CHOL/HDL Ratio: 4
Triglycerides: 123 mg/dL (ref 0.0–149.0)
VLDL: 24.6 mg/dL (ref 0.0–40.0)

## 2014-05-10 LAB — HEPATIC FUNCTION PANEL
ALT: 8 U/L (ref 0–35)
AST: 14 U/L (ref 0–37)
Albumin: 4.1 g/dL (ref 3.5–5.2)
Alkaline Phosphatase: 49 U/L (ref 39–117)
Bilirubin, Direct: 0.1 mg/dL (ref 0.0–0.3)
Total Bilirubin: 0.3 mg/dL (ref 0.2–1.2)
Total Protein: 7.2 g/dL (ref 6.0–8.3)

## 2014-05-10 MED ORDER — ATORVASTATIN CALCIUM 10 MG PO TABS: 5.0000 mg | ORAL_TABLET | ORAL | Status: DC

## 2014-05-10 MED ORDER — ATORVASTATIN CALCIUM 10 MG PO TABS: 5.0000 mg | ORAL_TABLET | Freq: Every day | ORAL | Status: DC

## 2014-05-10 MED ORDER — ENALAPRIL MALEATE 20 MG PO TABS: 10.0000 mg | ORAL_TABLET | Freq: Every day | ORAL | Status: DC

## 2014-05-10 NOTE — Progress Notes (Signed)
CARDIOLOGY OFFICE NOTE  Date:  05/10/2014    Bonnie Powers Date of Birth: [birth year] Medical Record #[id number]  PCP:  Velta Addison, [name], DO  Cardiologist:  Mare Ferrari    Chief Complaint  Patient presents with  . Coronary Artery Disease    2 month check - seen for Dr. Mare Ferrari.      History of Present Illness: Bonnie Powers is a 69 y.o. female who presents today for a 2 month check. Seen for Dr. Mare Ferrari. She has HTN, HLD, tobacco abuse, hepatitis, OA, and ulcerative colitis on Umuran.   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 Prescott Gum.  Post op course complicated by blood loss anemia and PAF. She was placed on amiodarone.   I saw her in December for her post hospital visit. She was doing ok. Cannot tolerate statin. She was not smoking. Enrolled in cardiac rehab.   Comes in today. Here with her husband, Bonnie Powers.Was in the ER over the weekend with an abscessed tooth/cellulitis - was to see dentistry. She is trying to find one in [place]. Still with some swelling over the right side of her face.  Saw Dr. Darcey Nora a few weeks ago - advised to stop her amiodarone when her current supply runs out. He also stopped her iron. She cannot afford cardiac rehab since her insurance changed but she enrolled in the Maintenance program. Still with some incisional chest soreness. Not short of breath. Not smoking. Some cough - clear sputum and some PND noted. Asking what to take. BP trending up. Previously on 20 mg of her ACE.   Past Medical History  Diagnosis Date  . Ulcerative colitis 2001    Last colonoscopy Dr [name] 12/28/09-> pan-UC, normal TI, maintained on Asacol and Imuran  . HTN (hypertension)   . OA (osteoarthritis)   . Hypercholesteremia   . Hepatitis 1972    ? unknown type  . Otitis media, chronic   . Basal cell cancer     left temple  . Anxiety   . Depression   .  Headache     Past Surgical History  Procedure Laterality Date  . Salpingoophorectomy      right ovary, both tubes  . Tonsillectomy    . Eye surgery    . Colonoscopy  12/28/09    Dr. [name] colitis  . Coronary artery bypass graft N/A 02/25/2014    Procedure: CORONARY ARTERY BYPASS GRAFTING (CABG);  Surgeon: Ivin Poot, MD;  Location: [facility];  Service: Open Heart Surgery;  Laterality: N/A;  . Intraoperative transesophageal echocardiogram N/A 02/25/2014    Procedure: INTRAOPERATIVE TRANSESOPHAGEAL ECHOCARDIOGRAM;  Surgeon: Ivin Poot, MD;  Location: [facility];  Service: Open Heart Surgery;  Laterality: N/A;  . Left heart catheterization with coronary angiogram N/A 02/23/2014    Procedure: LEFT HEART CATHETERIZATION WITH CORONARY ANGIOGRAM;  Surgeon: Burnell Blanks, MD;  Location: [facility] CATH LAB;  Service: Cardiovascular;  Laterality: N/A;     Medications: Current Outpatient Prescriptions  Medication Sig Dispense Refill  . acetaminophen (TYLENOL) 500 MG tablet Take 500-1,000 mg by mouth every 6 (six) hours as needed for mild pain or headache.     Marland Kitchen aspirin EC 81 MG tablet Take 1 tablet (81 mg total) by mouth daily. 90 tablet 3  . azaTHIOprine (IMURAN) 50 MG tablet Take 3 tablets (  150 mg total) by mouth daily. (Patient taking differently: Take 150 mg by mouth at bedtime. ) 270 tablet 3  . budesonide-formoterol (SYMBICORT) 160-4.5 MCG/ACT inhaler Inhale 2 puffs into the lungs 2 (two) times daily. 1 Inhaler 0  . Calcium Carb-Cholecalciferol 500-600 MG-UNIT TABS Take 1 tablet by mouth daily.    . cholecalciferol (VITAMIN D) 1000 UNITS tablet Take 1,000 Units by mouth daily.    . clindamycin (CLEOCIN) 300 MG capsule Take 1 capsule (300 mg total) by mouth 4 (four) times daily. X 7 days 28 capsule 0  . dicyclomine (BENTYL) 10 MG capsule Take 1 capsule (10 mg total) by mouth 3 (three) times daily as needed for spasms (diarrhea). 90 capsule 1  . enalapril (VASOTEC) 2.5 MG tablet  TAKE 1 TABLET BY MOUTH EVERY DAY 90 tablet 0  . escitalopram (LEXAPRO) 20 MG tablet Take 20 mg by mouth every morning.     Marland Kitchen HYDROcodone-acetaminophen (NORCO) 5-325 MG per tablet Take 1-2 tablets by mouth every 6 (six) hours as needed. 20 tablet 0  . Mesalamine (ASACOL HD) 800 MG TBEC Take 1 tablet (800 mg total) by mouth 3 (three) times daily. 270 tablet 3  . metoprolol tartrate (LOPRESSOR) 25 MG tablet TAKE 1/2 TABLET BY MOUTH TWICE DAILY 90 tablet 0  . Multiple Vitamin (MULTIVITAMIN WITH MINERALS) TABS tablet Take 1 tablet by mouth daily.    . nicotine (NICODERM CQ - DOSED IN MG/24 HOURS) 21 mg/24hr patch Place 1 patch (21 mg total) onto the skin daily. 28 patch 0  . oxyCODONE (OXY IR/ROXICODONE) 5 MG immediate release tablet Take 1-2 tablets (5-10 mg total) by mouth every 3 (three) hours as needed for severe pain. 40 tablet 0  . Probiotic Product (PROBIOTIC FORMULA PO) Take 1 tablet by mouth daily.    . traMADol (ULTRAM) 50 MG tablet Take 50 mg by mouth every 4 (four) hours as needed for moderate pain or severe pain.     No current facility-administered medications for this visit.    Allergies: Allergies  Allergen Reactions  . Statins Other (See Comments)    Muscle aches  . Aspirin     Social History: The patient  reports that she quit smoking about 2 months ago. Her smoking use included Cigarettes. She has a 30 pack-year smoking history. She has never used smokeless tobacco. She reports that she does not drink alcohol or use illicit drugs.   Family History: The patient's family history includes HIV in her brother; Heart failure in her mother; Lung cancer in her brother; Skin cancer in her father.   Review of Systems: Please see the history of present illness.   Otherwise, the review of systems is positive for facial swelling and headaches. Recent treatment for cellulitis/abscessed tooth.   All other systems are reviewed and negative.   Physical Exam: VS:  BP 164/88 mmHg  Pulse  66  Ht 5' 6"  (1.676 m)  Wt 200 lb 12.8 oz (91.082 kg)  BMI 32.43 kg/m2  SpO2 97% .  BMI Body mass index is 32.43 kg/(m^2).  Wt Readings from Last 3 Encounters:  05/10/14 200 lb 12.8 oz (91.082 kg)  04/13/14 198 lb (89.812 kg)  04/07/14 198 lb (89.812 kg)    General: Pleasant. Well developed, well nourished and in no acute distress.  HEENT: Normal but with very poor dentition.  Neck: Supple, no JVD, carotid bruits, or masses noted.  Cardiac: Regular rate and rhythm. No murmurs, rubs, or gallops. No edema.  Respiratory:  Lungs are fairly clear to auscultation bilaterally with normal work of breathing.  GI: Soft and nontender.  MS: No deformity or atrophy. Gait and ROM intact. Skin: Warm and dry. Color is normal.  Neuro:  Strength and sensation are intact and no gross focal deficits noted.  Psych: Alert, appropriate and with normal affect.   LABORATORY DATA:  EKG:  EKG is not ordered today. EKG from her ER visit was reviewed. She was in NSR.   Lab Results  Component Value Date   WBC 7.3 05/05/2014   HGB 10.7* 05/05/2014   HCT 34.5* 05/05/2014   PLT 270 05/05/2014   GLUCOSE 108* 05/05/2014   CHOL 171 02/23/2014   TRIG 94 02/23/2014   HDL 40 02/23/2014   LDLCALC 112* 02/23/2014   ALT 8 02/22/2014   AST 12 02/22/2014   NA 138 05/05/2014   K 3.7 05/05/2014   CL 107 05/05/2014   CREATININE 0.79 05/05/2014   BUN 15 05/05/2014   CO2 26 05/05/2014   TSH 2.80 03/18/2014   INR 1.34 02/25/2014   HGBA1C 6.5* 02/25/2014    BNP (last 3 results) No results for input(s): BNP in the last 8760 hours.  ProBNP (last 3 results) No results for input(s): PROBNP in the last 8760 hours.   Other Studies Reviewed Today: Echo Study Conclusions from November 2015  - Left ventricle: The cavity size was normal. Wall thickness was increased increased in a pattern of mild to moderate LVH. Systolic function was normal. The estimated ejection fraction was in the range of 55% to 60%.  Wall motion was normal; there were no regional wall motion abnormalities. - Aortic valve: Trileaflet; normal thickness, mildly calcified leaflets. There was mild to moderate regurgitation. - Mitral valve: Mildly calcified annulus. - Left atrium: The atrium was moderately to severely dilated.  Assessment / Plan: 1. NSTEMI with CABG x 3 - well preserved LV function - continues to do well clinically. Enrolled in cardiac rehab maintenance.   2. Tobacco abuse - not smoking.  3. PAF - off amiodarone. Remains in NSR by exam and from EKG last week.  4. HLD - rechecking labs today - she is apparently statin intolerant but has never tried 2 to 3 days per week therapy.   5. HTN - BP back up - I have increased her Vasotec to 10 mg a day. She will continue to monitor. If readings remain elevated over the next month, she will increase back to the full dose.   Current medicines are reviewed with the patient today.  The patient does not have concerns regarding medicines other than what was discussed above.  The following changes have been made:  See above.  Labs/ tests ordered today include:   No orders of the defined types were placed in this encounter.    Disposition:   FU with Dr. Mare Ferrari in 3 months  Patient is agreeable to this plan and will call if any problems develop in the interim.   Signed: Burtis Junes, RN, ANP-C 05/10/2014 8:35 AM  [facility] [address] [place], [place]  [address] Phone: [phone] Fax: [phone]

## 2014-05-10 NOTE — Patient Instructions (Addendum)
We will be checking the following labs today Lipids and HPF  Stay on your current medicines but I am increasing the Vasotect (enalapril) to 10 mg a day - cut your 20 mg in half.  Continue to monitor your blood pressure - if over the next month your readings remain above 150 on top - increase the Vasotec to a whole 20 mg   You can take plain Claritin (Loratadine) or Nasocort spray to help your post nasal drip.   See Dr. Mare Ferrari in 3 months (Tuesdays or Thursdays)  Call the [place] office at [phone] if you have any questions, problems or concerns.

## 2014-05-10 NOTE — Telephone Encounter (Signed)
Pt made aware of results.  Willing to take Lipitor but cannot pickup from pharmacy until next Monday, 2/8 because of co-payments for all of her medications she cannot afford it this week. Will start next week taking half tablet of Lipitor on a Monday, Wednesday, Friday schedule as recommended.  Pt agreed to return on 3/17 for a follow up lab appointment.

## 2014-05-11 ENCOUNTER — Encounter (HOSPITAL_COMMUNITY): Payer: Medicare HMO

## 2014-05-11 ENCOUNTER — Encounter (HOSPITAL_COMMUNITY)
Admission: RE | Admit: 2014-05-11 | Discharge: 2014-05-11 | Disposition: A | Discharge status: Home / Self Care | Payer: Medicare HMO | Source: Ambulatory Visit | Attending: Cardiovascular Disease | Admitting: Cardiovascular Disease

## 2014-05-13 ENCOUNTER — Encounter (HOSPITAL_COMMUNITY): Payer: Medicare HMO

## 2014-05-16 ENCOUNTER — Encounter (HOSPITAL_COMMUNITY)
Admission: RE | Admit: 2014-05-16 | Discharge: 2014-05-16 | Disposition: A | Discharge status: Home / Self Care | Payer: Medicare HMO | Source: Ambulatory Visit | Attending: Cardiovascular Disease | Admitting: Cardiovascular Disease

## 2014-05-16 ENCOUNTER — Encounter (HOSPITAL_COMMUNITY): Payer: Medicare HMO

## 2014-05-18 ENCOUNTER — Encounter (HOSPITAL_COMMUNITY)
Admission: RE | Admit: 2014-05-18 | Discharge: 2014-05-18 | Disposition: A | Discharge status: Home / Self Care | Payer: Medicare HMO | Source: Ambulatory Visit | Attending: Cardiovascular Disease | Admitting: Cardiovascular Disease

## 2014-05-18 ENCOUNTER — Encounter (HOSPITAL_COMMUNITY): Payer: Medicare HMO

## 2014-05-20 ENCOUNTER — Encounter (HOSPITAL_COMMUNITY): Payer: Medicare HMO

## 2014-05-23 ENCOUNTER — Encounter (HOSPITAL_COMMUNITY): Payer: Medicare HMO

## 2014-05-25 ENCOUNTER — Encounter (HOSPITAL_COMMUNITY): Payer: Medicare HMO

## 2014-05-26 ENCOUNTER — Other Ambulatory Visit (HOSPITAL_COMMUNITY): Payer: Self-pay | Admitting: Family Medicine

## 2014-05-26 ENCOUNTER — Ambulatory Visit (HOSPITAL_COMMUNITY)
Admission: RE | Admit: 2014-05-26 | Discharge: 2014-05-26 | Disposition: A | Discharge status: Home / Self Care | Payer: Medicare HMO | Source: Ambulatory Visit | Attending: Family Medicine | Admitting: Family Medicine

## 2014-05-26 DIAGNOSIS — J189 Pneumonia, unspecified organism: Secondary | ICD-10-CM

## 2014-05-26 DIAGNOSIS — I517 Cardiomegaly: Secondary | ICD-10-CM | POA: Diagnosis not present

## 2014-05-26 DIAGNOSIS — Z951 Presence of aortocoronary bypass graft: Secondary | ICD-10-CM | POA: Diagnosis present

## 2014-05-27 ENCOUNTER — Encounter (HOSPITAL_COMMUNITY): Payer: Medicare HMO

## 2014-05-30 ENCOUNTER — Encounter (HOSPITAL_COMMUNITY): Payer: Medicare HMO

## 2014-06-01 ENCOUNTER — Encounter (HOSPITAL_COMMUNITY)
Admission: RE | Admit: 2014-06-01 | Discharge: 2014-06-01 | Disposition: A | Discharge status: Home / Self Care | Payer: Medicare HMO | Source: Ambulatory Visit | Attending: Cardiovascular Disease | Admitting: Cardiovascular Disease

## 2014-06-01 ENCOUNTER — Encounter (HOSPITAL_COMMUNITY): Payer: Medicare HMO

## 2014-06-03 ENCOUNTER — Encounter (HOSPITAL_COMMUNITY): Payer: Medicare HMO

## 2014-06-03 ENCOUNTER — Encounter (HOSPITAL_COMMUNITY)
Admission: RE | Admit: 2014-06-03 | Discharge: 2014-06-03 | Disposition: A | Discharge status: Home / Self Care | Payer: Medicare HMO | Source: Ambulatory Visit | Attending: Cardiovascular Disease | Admitting: Cardiovascular Disease

## 2014-06-06 ENCOUNTER — Encounter (HOSPITAL_COMMUNITY): Payer: Medicare HMO

## 2014-06-06 ENCOUNTER — Encounter (HOSPITAL_COMMUNITY)
Admission: RE | Admit: 2014-06-06 | Discharge: 2014-06-06 | Disposition: A | Discharge status: Home / Self Care | Payer: Medicare HMO | Source: Ambulatory Visit | Attending: Cardiovascular Disease | Admitting: Cardiovascular Disease

## 2014-06-08 ENCOUNTER — Encounter (HOSPITAL_COMMUNITY)
Admission: RE | Admit: 2014-06-08 | Discharge: 2014-06-08 | Disposition: A | Discharge status: Home / Self Care | Payer: Self-pay | Source: Ambulatory Visit | Attending: Cardiovascular Disease | Admitting: Cardiovascular Disease

## 2014-06-08 ENCOUNTER — Encounter (HOSPITAL_COMMUNITY): Payer: Medicare HMO

## 2014-06-08 DIAGNOSIS — Z951 Presence of aortocoronary bypass graft: Secondary | ICD-10-CM | POA: Insufficient documentation

## 2014-06-08 DIAGNOSIS — I252 Old myocardial infarction: Secondary | ICD-10-CM | POA: Insufficient documentation

## 2014-06-09 ENCOUNTER — Encounter: Payer: Self-pay | Admitting: Internal Medicine

## 2014-06-10 ENCOUNTER — Encounter (HOSPITAL_COMMUNITY)
Admission: RE | Admit: 2014-06-10 | Discharge: 2014-06-10 | Disposition: A | Discharge status: Home / Self Care | Payer: Self-pay | Source: Ambulatory Visit | Attending: Cardiology | Admitting: Cardiology

## 2014-06-10 ENCOUNTER — Encounter (HOSPITAL_COMMUNITY): Payer: Medicare HMO

## 2014-06-13 ENCOUNTER — Ambulatory Visit: Payer: Medicare HMO | Admitting: Gastroenterology

## 2014-06-13 ENCOUNTER — Encounter (HOSPITAL_COMMUNITY)
Admission: RE | Admit: 2014-06-13 | Discharge: 2014-06-13 | Disposition: A | Discharge status: Home / Self Care | Payer: Self-pay | Source: Ambulatory Visit | Attending: Cardiology | Admitting: Cardiology

## 2014-06-13 ENCOUNTER — Encounter (HOSPITAL_COMMUNITY): Payer: Medicare HMO

## 2014-06-15 ENCOUNTER — Encounter (HOSPITAL_COMMUNITY): Payer: Medicare HMO

## 2014-06-15 ENCOUNTER — Encounter (HOSPITAL_COMMUNITY)
Admission: RE | Admit: 2014-06-15 | Discharge: 2014-06-15 | Disposition: A | Discharge status: Home / Self Care | Payer: Self-pay | Source: Ambulatory Visit | Attending: Cardiology | Admitting: Cardiology

## 2014-06-17 ENCOUNTER — Encounter (HOSPITAL_COMMUNITY): Payer: Self-pay

## 2014-06-17 ENCOUNTER — Encounter (HOSPITAL_COMMUNITY): Payer: Medicare HMO

## 2014-06-20 ENCOUNTER — Encounter (HOSPITAL_COMMUNITY)
Admission: RE | Admit: 2014-06-20 | Discharge: 2014-06-20 | Disposition: A | Discharge status: Home / Self Care | Payer: Self-pay | Source: Ambulatory Visit | Attending: Cardiology | Admitting: Cardiology

## 2014-06-22 ENCOUNTER — Encounter (HOSPITAL_COMMUNITY)
Admission: RE | Admit: 2014-06-22 | Discharge: 2014-06-22 | Disposition: A | Discharge status: Home / Self Care | Payer: Self-pay | Source: Ambulatory Visit | Attending: Cardiology | Admitting: Cardiology

## 2014-06-23 ENCOUNTER — Other Ambulatory Visit (INDEPENDENT_AMBULATORY_CARE_PROVIDER_SITE_OTHER): Payer: Medicare HMO | Admitting: *Deleted

## 2014-06-23 DIAGNOSIS — E785 Hyperlipidemia, unspecified: Secondary | ICD-10-CM

## 2014-06-23 LAB — LIPID PANEL
Cholesterol: 185 mg/dL (ref 0–200)
HDL: 50.1 mg/dL (ref >39.00)
LDL Cholesterol: 119 mg/dL — ABNORMAL HIGH (ref 0–99)
NonHDL: 134.9
Total CHOL/HDL Ratio: 4
Triglycerides: 78 mg/dL (ref 0.0–149.0)
VLDL: 15.6 mg/dL (ref 0.0–40.0)

## 2014-06-23 LAB — HEPATIC FUNCTION PANEL
ALT: 10 U/L (ref 0–35)
AST: 15 U/L (ref 0–37)
Albumin: 4.1 g/dL (ref 3.5–5.2)
Alkaline Phosphatase: 48 U/L (ref 39–117)
Bilirubin, Direct: 0.1 mg/dL (ref 0.0–0.3)
Total Bilirubin: 0.3 mg/dL (ref 0.2–1.2)
Total Protein: 7.3 g/dL (ref 6.0–8.3)

## 2014-06-24 ENCOUNTER — Other Ambulatory Visit: Payer: Self-pay | Admitting: Nurse Practitioner

## 2014-06-24 ENCOUNTER — Encounter (HOSPITAL_COMMUNITY)
Admission: RE | Admit: 2014-06-24 | Discharge: 2014-06-24 | Disposition: A | Discharge status: Home / Self Care | Payer: Self-pay | Source: Ambulatory Visit | Attending: Cardiology | Admitting: Cardiology

## 2014-06-24 ENCOUNTER — Telehealth: Payer: Self-pay | Admitting: *Deleted

## 2014-06-24 ENCOUNTER — Other Ambulatory Visit: Payer: Self-pay | Admitting: *Deleted

## 2014-06-24 MED ORDER — ATORVASTATIN CALCIUM 10 MG PO TABS: 5.0000 mg | ORAL_TABLET | Freq: Every day | ORAL | Status: DC

## 2014-06-24 NOTE — Telephone Encounter (Signed)
error 

## 2014-06-27 ENCOUNTER — Encounter (HOSPITAL_COMMUNITY)
Admission: RE | Admit: 2014-06-27 | Discharge: 2014-06-27 | Disposition: A | Discharge status: Home / Self Care | Payer: Self-pay | Source: Ambulatory Visit | Attending: Cardiology | Admitting: Cardiology

## 2014-06-29 ENCOUNTER — Encounter (HOSPITAL_COMMUNITY)
Admission: RE | Admit: 2014-06-29 | Discharge: 2014-06-29 | Disposition: A | Discharge status: Home / Self Care | Payer: Self-pay | Source: Ambulatory Visit | Attending: Cardiology | Admitting: Cardiology

## 2014-07-01 ENCOUNTER — Encounter (HOSPITAL_COMMUNITY): Payer: Self-pay

## 2014-07-04 ENCOUNTER — Encounter (HOSPITAL_COMMUNITY): Payer: Self-pay

## 2014-07-06 ENCOUNTER — Encounter (HOSPITAL_COMMUNITY)
Admission: RE | Admit: 2014-07-06 | Discharge: 2014-07-06 | Disposition: A | Discharge status: Home / Self Care | Payer: Self-pay | Source: Ambulatory Visit | Attending: Cardiology | Admitting: Cardiology

## 2014-07-08 ENCOUNTER — Encounter (HOSPITAL_COMMUNITY): Payer: Self-pay

## 2014-07-11 ENCOUNTER — Encounter (HOSPITAL_COMMUNITY)
Admission: RE | Admit: 2014-07-11 | Discharge: 2014-07-11 | Disposition: A | Discharge status: Home / Self Care | Payer: Self-pay | Source: Ambulatory Visit | Attending: Cardiology | Admitting: Cardiology

## 2014-07-11 DIAGNOSIS — I252 Old myocardial infarction: Secondary | ICD-10-CM | POA: Insufficient documentation

## 2014-07-11 DIAGNOSIS — Z951 Presence of aortocoronary bypass graft: Secondary | ICD-10-CM | POA: Insufficient documentation

## 2014-07-13 ENCOUNTER — Encounter (HOSPITAL_COMMUNITY)
Admission: RE | Admit: 2014-07-13 | Discharge: 2014-07-13 | Disposition: A | Discharge status: Home / Self Care | Payer: Self-pay | Source: Ambulatory Visit | Attending: Cardiology | Admitting: Cardiology

## 2014-07-15 ENCOUNTER — Encounter (HOSPITAL_COMMUNITY)
Admission: RE | Admit: 2014-07-15 | Discharge: 2014-07-15 | Disposition: A | Discharge status: Home / Self Care | Payer: Self-pay | Source: Ambulatory Visit | Attending: Cardiology | Admitting: Cardiology

## 2014-07-18 ENCOUNTER — Encounter (HOSPITAL_COMMUNITY): Payer: Self-pay

## 2014-07-20 ENCOUNTER — Encounter (HOSPITAL_COMMUNITY)
Admission: RE | Admit: 2014-07-20 | Discharge: 2014-07-20 | Disposition: A | Discharge status: Home / Self Care | Payer: Self-pay | Source: Ambulatory Visit | Attending: Cardiology | Admitting: Cardiology

## 2014-07-22 ENCOUNTER — Encounter (HOSPITAL_COMMUNITY)
Admission: RE | Admit: 2014-07-22 | Discharge: 2014-07-22 | Disposition: A | Discharge status: Home / Self Care | Payer: Self-pay | Source: Ambulatory Visit | Attending: Cardiology | Admitting: Cardiology

## 2014-07-25 ENCOUNTER — Other Ambulatory Visit (HOSPITAL_COMMUNITY): Payer: Self-pay | Admitting: Family Medicine

## 2014-07-25 ENCOUNTER — Telehealth: Payer: Self-pay

## 2014-07-25 ENCOUNTER — Encounter (HOSPITAL_COMMUNITY)
Admission: RE | Admit: 2014-07-25 | Discharge: 2014-07-25 | Disposition: A | Discharge status: Home / Self Care | Payer: Self-pay | Source: Ambulatory Visit | Attending: Cardiology | Admitting: Cardiology

## 2014-07-25 DIAGNOSIS — Z1231 Encounter for screening mammogram for malignant neoplasm of breast: Secondary | ICD-10-CM

## 2014-07-25 NOTE — Telephone Encounter (Signed)
We need to find out why. Is it not no formulary? Does it need PA?

## 2014-07-25 NOTE — Telephone Encounter (Signed)
Patient called this afternoon. She had a missed call from DS. I told her DS left early today and would be back in the morning and I would send a phone note that she had returned her call. [phone]

## 2014-07-25 NOTE — Telephone Encounter (Signed)
[employer] in [place] said the co pay for pt on the ASACOL HD 800 is $326.79. Pt is requesting to have something cheaper.

## 2014-07-25 NOTE — Telephone Encounter (Signed)
LMOM for pt to call. 

## 2014-07-26 NOTE — Telephone Encounter (Signed)
Printed copy of pts formulary with [employer]:  Asachol, delzicol, lialda, pentasa are tier 4 apriso is tier 3 Balsalazide, mesalamine enema and sulfasalazine are tier 2  And she has to use mail order to get these.  I have placed a copy of the formulary on LSL desk.

## 2014-07-26 NOTE — Telephone Encounter (Signed)
She was on recall for OV 06/2014, please make appt.  Almyra Free please help. Patient is already on Imuran. Sulfazine is not a substitute for Asacol. Please help figure out why there is such a large increase.

## 2014-07-26 NOTE — Telephone Encounter (Signed)
See previous note of 07/25/2014.

## 2014-07-26 NOTE — Telephone Encounter (Signed)
I spoke to pt. She said she has changed insurance to [employer]. However, she said she had only a $95.00 co-pay for a couple of times and this time it went up.  She will call her insurance and ask what is on the formulary and call me back.

## 2014-07-26 NOTE — Telephone Encounter (Signed)
Pt called back and said her insurance co said that the ASACOL CO went up on their prices.  She said they said that Sulfazine and Imuran is on formulary, however, Imuran will require a PA.  Routing to Neil Crouch, PA since pt last seen by her in 03/2014.

## 2014-07-27 ENCOUNTER — Encounter (HOSPITAL_COMMUNITY)
Admission: RE | Admit: 2014-07-27 | Discharge: 2014-07-27 | Disposition: A | Discharge status: Home / Self Care | Payer: Self-pay | Source: Ambulatory Visit | Attending: Cardiology | Admitting: Cardiology

## 2014-07-27 NOTE — Telephone Encounter (Signed)
Tried to call pt- LMOM, asked her to call me back.

## 2014-07-27 NOTE — Telephone Encounter (Signed)
If she wants to see if Bonnie Powers is significant savings that would be the most equivalent medication to Asacol.

## 2014-07-27 NOTE — Addendum Note (Signed)
Addended by: Mahala Menghini on: 07/27/2014 02:31 PM   Modules accepted: Orders, Medications

## 2014-07-28 MED ORDER — MESALAMINE ER 0.375 G PO CP24: 1500.0000 mg | ORAL_CAPSULE | Freq: Every day | ORAL | Status: DC

## 2014-07-28 NOTE — Telephone Encounter (Signed)
RX for Apriso done.

## 2014-07-28 NOTE — Addendum Note (Signed)
Addended by: Mahala Menghini on: 07/28/2014 11:33 AM   Modules accepted: Orders

## 2014-07-28 NOTE — Telephone Encounter (Signed)
Pt is also aware that I am working on a PA for Imuran.

## 2014-07-28 NOTE — Telephone Encounter (Signed)
Pt is aware. She said she would try the apriso. She wants it sent to [place]. She said she checked and it wont be any cheaper thru mail order.

## 2014-07-28 NOTE — Telephone Encounter (Signed)
Imuran has been approved and notification sent to the pharmacy.

## 2014-07-29 ENCOUNTER — Encounter (HOSPITAL_COMMUNITY)
Admission: RE | Admit: 2014-07-29 | Discharge: 2014-07-29 | Disposition: A | Discharge status: Home / Self Care | Payer: Self-pay | Source: Ambulatory Visit | Attending: Cardiology | Admitting: Cardiology

## 2014-08-01 ENCOUNTER — Encounter (HOSPITAL_COMMUNITY): Payer: Self-pay

## 2014-08-03 ENCOUNTER — Encounter (HOSPITAL_COMMUNITY)
Admission: RE | Admit: 2014-08-03 | Discharge: 2014-08-03 | Disposition: A | Discharge status: Home / Self Care | Payer: Self-pay | Source: Ambulatory Visit | Attending: Cardiology | Admitting: Cardiology

## 2014-08-05 ENCOUNTER — Encounter (HOSPITAL_COMMUNITY)
Admission: RE | Admit: 2014-08-05 | Discharge: 2014-08-05 | Disposition: A | Discharge status: Home / Self Care | Payer: Self-pay | Source: Ambulatory Visit | Attending: Cardiology | Admitting: Cardiology

## 2014-08-08 ENCOUNTER — Encounter (HOSPITAL_COMMUNITY)
Admission: RE | Admit: 2014-08-08 | Discharge: 2014-08-08 | Disposition: A | Discharge status: Home / Self Care | Payer: Medicare HMO | Source: Ambulatory Visit | Attending: Cardiology | Admitting: Cardiology

## 2014-08-08 DIAGNOSIS — I252 Old myocardial infarction: Secondary | ICD-10-CM | POA: Insufficient documentation

## 2014-08-08 DIAGNOSIS — Z951 Presence of aortocoronary bypass graft: Secondary | ICD-10-CM | POA: Insufficient documentation

## 2014-08-10 ENCOUNTER — Encounter (HOSPITAL_COMMUNITY): Payer: Medicare HMO

## 2014-08-12 ENCOUNTER — Encounter (HOSPITAL_COMMUNITY): Payer: Medicare HMO

## 2014-08-15 ENCOUNTER — Encounter (HOSPITAL_COMMUNITY): Payer: Medicare HMO

## 2014-08-16 ENCOUNTER — Ambulatory Visit (INDEPENDENT_AMBULATORY_CARE_PROVIDER_SITE_OTHER): Payer: Medicare HMO | Admitting: Cardiology

## 2014-08-16 ENCOUNTER — Encounter: Payer: Self-pay | Admitting: Cardiology

## 2014-08-16 VITALS — BP 146/80 | HR 57 | Ht 66.0 in | Wt 212.0 lb

## 2014-08-16 DIAGNOSIS — I214 Non-ST elevation (NSTEMI) myocardial infarction: Secondary | ICD-10-CM | POA: Diagnosis not present

## 2014-08-16 DIAGNOSIS — I480 Paroxysmal atrial fibrillation: Secondary | ICD-10-CM

## 2014-08-16 DIAGNOSIS — Z951 Presence of aortocoronary bypass graft: Secondary | ICD-10-CM | POA: Diagnosis not present

## 2014-08-16 DIAGNOSIS — E785 Hyperlipidemia, unspecified: Secondary | ICD-10-CM | POA: Diagnosis not present

## 2014-08-16 MED ORDER — ENALAPRIL MALEATE 20 MG PO TABS: 20.0000 mg | ORAL_TABLET | Freq: Two times a day (BID) | ORAL | Status: DC

## 2014-08-16 NOTE — Patient Instructions (Signed)
Medication Instructions:  Your physician recommends that you continue on your current medications as directed. Please refer to the Current Medication list given to you today.  Labwork: NONE  Testing/Procedures: NONE  Follow-Up: Your physician wants you to follow-up in: [place] will receive a reminder letter in the mail two months in advance. If you don't receive a letter, please call our office to schedule the follow-up appointment.

## 2014-08-16 NOTE — Progress Notes (Signed)
Cardiology Office Note   Date:  08/16/2014   ID:  Bonnie Powers, DOB 05-Sep-1945, MRN [id number]  PCP:  Pauline Good, NP  Cardiologist: Darlin Coco MD  No chief complaint on file.     History of Present Illness: Bonnie Powers is a 69 y.o. female who presents for follow-up office visit  Presented back in November of 2015 with NSTEMI - found to have multivessel CAD - s/p CABG x 3 with LIMA to LAD, SVG to DX, SVG to RCA per Dr. Prescott Gum. Post op course complicated by blood loss anemia and PAF. She was placed on amiodarone.  Amiodarone was subsequently stopped.  She has had no recurrence of her atrial fibrillation. She is still smoking occasionally, unfortunately. Her weight is up 12 pounds since last visit.  Pressure has been labile. She has had a rash under her breasts which may reflect elevated blood sugar.  She will discuss this with her PCP.  She is scheduled for extensive lab work this summer with PCP.  In the meantime she will try to cut way back on carbohydrates and to lose weight He has not been having any chest pain or angina.  She still has some musculoskeletal chest wall pain in the area of the incision.  She had a chest x-ray in February which was unremarkable.     Past Medical History  Diagnosis Date  . Ulcerative colitis 2001    Last colonoscopy Dr [name] 12/28/09-> pan-UC, normal TI, maintained on Asacol and Imuran  . HTN (hypertension)   . OA (osteoarthritis)   . Hypercholesteremia   . Hepatitis 1972    ? unknown type  . Otitis media, chronic   . Basal cell cancer     left temple  . Anxiety   . Depression   . Headache     Past Surgical History  Procedure Laterality Date  . Salpingoophorectomy      right ovary, both tubes  . Tonsillectomy    . Eye surgery    . Colonoscopy  12/28/09    Dr. [name] colitis  . Coronary artery bypass graft N/A 02/25/2014    Procedure: CORONARY ARTERY BYPASS GRAFTING (CABG);  Surgeon: Ivin Poot, MD;   Location: [facility];  Service: Open Heart Surgery;  Laterality: N/A;  . Intraoperative transesophageal echocardiogram N/A 02/25/2014    Procedure: INTRAOPERATIVE TRANSESOPHAGEAL ECHOCARDIOGRAM;  Surgeon: Ivin Poot, MD;  Location: [facility];  Service: Open Heart Surgery;  Laterality: N/A;  . Left heart catheterization with coronary angiogram N/A 02/23/2014    Procedure: LEFT HEART CATHETERIZATION WITH CORONARY ANGIOGRAM;  Surgeon: Burnell Blanks, MD;  Location: [facility] CATH LAB;  Service: Cardiovascular;  Laterality: N/A;     Current Outpatient Prescriptions  Medication Sig Dispense Refill  . acetaminophen (TYLENOL) 500 MG tablet Take 500-1,000 mg by mouth every 6 (six) hours as needed for mild pain or headache.     Marland Kitchen aspirin EC 81 MG tablet Take 1 tablet (81 mg total) by mouth daily. 90 tablet 3  . atorvastatin (LIPITOR) 10 MG tablet Take 0.5 tablets (5 mg total) by mouth daily. Take on Mondays, Wednesdays, and Friday 90 tablet 3  . azaTHIOprine (IMURAN) 50 MG tablet Take 3 tablets (150 mg total) by mouth daily. (Patient taking differently: Take 150 mg by mouth at bedtime. ) 270 tablet 3  . Calcium Carb-Cholecalciferol 500-600 MG-UNIT TABS Take 1 tablet by mouth daily.    . cholecalciferol (VITAMIN D) 1000 UNITS tablet Take  1,000 Units by mouth daily.    Marland Kitchen dicyclomine (BENTYL) 10 MG capsule Take 1 capsule (10 mg total) by mouth 3 (three) times daily as needed for spasms (diarrhea). 90 capsule 1  . enalapril (VASOTEC) 20 MG tablet Take 1 tablet (20 mg total) by mouth 2 (two) times daily. 180 tablet 3  . escitalopram (LEXAPRO) 20 MG tablet Take 20 mg by mouth every morning.     . mesalamine (APRISO) 0.375 G 24 hr capsule Take 4 capsules (1.5 g total) by mouth daily. 120 capsule 11  . metoprolol (LOPRESSOR) 50 MG tablet Take 50 mg by mouth 2 (two) times daily.    . Multiple Vitamin (MULTIVITAMIN WITH MINERALS) TABS tablet Take 1 tablet by mouth daily.    . nicotine (NICODERM CQ - DOSED IN MG/24  HOURS) 21 mg/24hr patch Place 1 patch (21 mg total) onto the skin daily. 28 patch 0  . Probiotic Product (PROBIOTIC FORMULA PO) Take 1 tablet by mouth daily.     No current facility-administered medications for this visit.    Allergies:   Statins and Aspirin    Social History:  The patient  reports that she quit smoking about 5 months ago. Her smoking use included Cigarettes. She has a 30 pack-year smoking history. She has never used smokeless tobacco. She reports that she does not drink alcohol or use illicit drugs.   Family History:  The patient's family history includes HIV in her brother; Heart failure in her mother; Lung cancer in her brother; Skin cancer in her father.    ROS:  Please see the history of present illness.   Otherwise, review of systems are positive for none.   All other systems are reviewed and negative.    PHYSICAL EXAM: VS:  BP 146/80 mmHg  Pulse 57  Ht 5' 6"  (1.676 m)  Wt 212 lb (96.163 kg)  BMI 34.23 kg/m2 , BMI Body mass index is 34.23 kg/(m^2). GEN: Well nourished, well developed, in no acute distress HEENT: normal Neck: no JVD, carotid bruits, or masses Cardiac: RRR; no murmurs, rubs, or gallops,no edema  Respiratory:  clear to auscultation bilaterally, normal work of breathing GI: soft, nontender, nondistended, + BS MS: no deformity or atrophy Skin: warm and dry, no rash Neuro:  Strength and sensation are intact Psych: euthymic mood, full affect   EKG:  EKG is not ordered today.    Recent Labs: 02/26/2014: Magnesium 2.3 03/18/2014: TSH 2.80 05/05/2014: BUN 15; Creatinine 0.79; Hemoglobin 10.7*; Platelets 270; Potassium 3.7; Sodium 138 06/23/2014: ALT 10    Lipid Panel    Component Value Date/Time   CHOL 185 06/23/2014 0952   TRIG 78.0 06/23/2014 0952   HDL 50.10 06/23/2014 0952   CHOLHDL 4 06/23/2014 0952   VLDL 15.6 06/23/2014 0952   LDLCALC 119* 06/23/2014 0952      Wt Readings from Last 3 Encounters:  08/16/14 212 lb (96.163 kg)   05/10/14 200 lb 12.8 oz (91.082 kg)  04/13/14 198 lb (89.812 kg)         ASSESSMENT AND PLAN:  1. NSTEMI with CABG x 3 - well preserved LV function - continues to do well clinically.  She has completed cardiac rehabilitation.   2. Tobacco abuse - she is wearing the nicotine patch.  She smokes occasionally.  Encouraged her to quit altogether  3. PAF - off amiodarone. Remains in NSR by exam .  4. HLD - tolerating alternate day low-dose statin therapy  5. HTN -labile.  Repeat  blood pressure today 140/76.  Continue enalapril 20 mg twice a day.  Continue metoprolol.   Current medicines are reviewed at length with the patient today.  The patient does not have concerns regarding medicines.  The following changes have been made:  no change  Labs/ tests ordered today include:  No orders of the defined types were placed in this encounter.    Physician: Continue current therapy.  Recheck in 6 months for office visit and EKG.  Berna Spare MD 08/16/2014 10:37 AM    [address], [place], [place]  [address] Phone: [phone]; Fax: [phone]

## 2014-08-17 ENCOUNTER — Encounter (HOSPITAL_COMMUNITY): Payer: Medicare HMO

## 2014-08-19 ENCOUNTER — Encounter (HOSPITAL_COMMUNITY): Payer: Medicare HMO

## 2014-08-22 ENCOUNTER — Encounter (HOSPITAL_COMMUNITY): Payer: Medicare HMO

## 2014-08-22 ENCOUNTER — Ambulatory Visit (HOSPITAL_COMMUNITY): Payer: Medicare HMO

## 2014-08-24 ENCOUNTER — Encounter (HOSPITAL_COMMUNITY): Payer: Medicare HMO

## 2014-08-26 ENCOUNTER — Encounter (HOSPITAL_COMMUNITY): Payer: Medicare HMO

## 2014-08-29 ENCOUNTER — Encounter (HOSPITAL_COMMUNITY): Payer: Medicare HMO

## 2014-08-31 ENCOUNTER — Encounter (HOSPITAL_COMMUNITY): Payer: Medicare HMO

## 2014-08-31 ENCOUNTER — Ambulatory Visit (HOSPITAL_COMMUNITY)
Admission: RE | Admit: 2014-08-31 | Discharge: 2014-08-31 | Disposition: A | Discharge status: Home / Self Care | Payer: Medicare HMO | Source: Ambulatory Visit | Attending: Family Medicine | Admitting: Family Medicine

## 2014-08-31 DIAGNOSIS — Z1231 Encounter for screening mammogram for malignant neoplasm of breast: Secondary | ICD-10-CM | POA: Insufficient documentation

## 2014-09-02 ENCOUNTER — Ambulatory Visit (HOSPITAL_COMMUNITY): Payer: Medicare HMO

## 2014-09-02 ENCOUNTER — Encounter (HOSPITAL_COMMUNITY): Payer: Medicare HMO

## 2014-09-05 ENCOUNTER — Encounter (HOSPITAL_COMMUNITY): Payer: Medicare HMO

## 2014-09-07 ENCOUNTER — Encounter (HOSPITAL_COMMUNITY): Payer: Medicare HMO

## 2014-09-09 ENCOUNTER — Encounter (HOSPITAL_COMMUNITY): Payer: Medicare HMO

## 2014-09-12 ENCOUNTER — Encounter (HOSPITAL_COMMUNITY): Payer: Medicare HMO

## 2014-09-14 ENCOUNTER — Encounter (HOSPITAL_COMMUNITY): Payer: Medicare HMO

## 2014-09-16 ENCOUNTER — Encounter (HOSPITAL_COMMUNITY): Payer: Medicare HMO

## 2014-09-19 ENCOUNTER — Encounter (HOSPITAL_COMMUNITY): Payer: Medicare HMO

## 2014-09-21 ENCOUNTER — Encounter (HOSPITAL_COMMUNITY): Payer: Medicare HMO

## 2014-09-23 ENCOUNTER — Encounter (HOSPITAL_COMMUNITY): Payer: Medicare HMO

## 2014-09-26 ENCOUNTER — Encounter (HOSPITAL_COMMUNITY): Payer: Medicare HMO

## 2014-09-28 ENCOUNTER — Encounter (HOSPITAL_COMMUNITY): Payer: Medicare HMO

## 2014-09-30 ENCOUNTER — Encounter (HOSPITAL_COMMUNITY): Payer: Medicare HMO

## 2014-10-03 ENCOUNTER — Encounter (HOSPITAL_COMMUNITY): Payer: Medicare HMO

## 2014-10-05 ENCOUNTER — Encounter (HOSPITAL_COMMUNITY): Payer: Medicare HMO

## 2014-10-07 ENCOUNTER — Encounter (HOSPITAL_COMMUNITY): Payer: Medicare HMO

## 2014-10-10 ENCOUNTER — Encounter (HOSPITAL_COMMUNITY): Payer: Medicare HMO

## 2014-10-12 ENCOUNTER — Encounter (HOSPITAL_COMMUNITY): Payer: Medicare HMO

## 2014-11-01 ENCOUNTER — Ambulatory Visit (INDEPENDENT_AMBULATORY_CARE_PROVIDER_SITE_OTHER): Payer: Medicare HMO | Admitting: Gastroenterology

## 2014-11-01 ENCOUNTER — Encounter: Payer: Self-pay | Admitting: Gastroenterology

## 2014-11-01 VITALS — BP 145/73 | HR 89 | Temp 98.4°F | Ht 66.0 in | Wt 212.4 lb

## 2014-11-01 DIAGNOSIS — K5190 Ulcerative colitis, unspecified, without complications: Secondary | ICD-10-CM | POA: Diagnosis not present

## 2014-11-01 MED ORDER — BUDESONIDE 9 MG PO TB24: 9.0000 mg | ORAL_TABLET | Freq: Every day | ORAL | Status: DC

## 2014-11-01 NOTE — Patient Instructions (Signed)
1. We will likely have to fill out a prior authorization to get the Uceris covered by your insurance. I would like to try and avoid prednisone if possible. I will give you samples of Uceris to take one daily while we work on this issue.  2. Call beginning of next week if you are not improving.

## 2014-11-01 NOTE — Assessment & Plan Note (Signed)
Ulcerative colitis, active. Flare began about 2 weeks ago. Possibly related to stress. Doubt C. difficile without recent antibiotic exposure. Patient has had difficult side effects to prednisone in the past, significant irritability would like to avoid if possible. Previously did very well with Uceris in the past. Start Uceris 9 mg daily. 2 weeks of samples provided. We will have to complete prior authorization for coverage. We'll like to have up to 8 weeks available if needed. If patient does not show significant improvement by Monday she is to give [place] a call.  She plans to have blood work with her PCP in September. I requested that she see if they'll check a CBC at the time. Need to follow-up on her hemoglobin. She agrees. She will also call and schedule OV so she can get her colonoscopy scheduled.

## 2014-11-01 NOTE — Progress Notes (Signed)
Primary Care Physician:  Pauline Good, NP  Primary Gastroenterologist:  Garfield Cornea, MD   Chief Complaint  Patient presents with  . Crohn's Disease    HPI:  Bonnie Powers is a 69 y.o. female here for follow-up of ulcerative colitis. Last seen back in December 2015. Prior to that visit she unfortunately was hospitalized with NSTEMI and underwent three-vessel coronary artery bypass grafting on November 20. At the time was on Imuran and Asacol. Postpone surveillance colonoscopy due to this surgery. In 07/2014 she switched to [facility] because of better formulary coverage and seemed to transition from Asacol without any issues.   UC flare up for couple of weeks. At baseline typically has a couple bowel movements a day. Now having too numerous to count. Positive nocturnal diarrhea No blood in the stool. Mid-day, abdominal cramping starts and into night. Can be painful like labor. Bentyl 3 times per day not helping. No blood in stool. No antibiotics since march. Lot of stress. Compliant with medication. Plans for trip to [place], leaving August 17. Will call to schedule office visit for follow-up and make arrangements for colonoscopy at that time.  Patient reports previously having unwanted side effects to prednisone, significant irritability. Previously did well on Uceris in 2014 for flare.    Current Outpatient Prescriptions  Medication Sig Dispense Refill  . acetaminophen (TYLENOL) 500 MG tablet Take 500-1,000 mg by mouth every 6 (six) hours as needed for mild pain or headache.     Marland Kitchen aspirin EC 81 MG tablet Take 1 tablet (81 mg total) by mouth daily. 90 tablet 3  . atorvastatin (LIPITOR) 10 MG tablet Take 0.5 tablets (5 mg total) by mouth daily. Take on Mondays, Wednesdays, and Friday 90 tablet 3  . azaTHIOprine (IMURAN) 50 MG tablet Take 3 tablets (150 mg total) by mouth daily. (Patient taking differently: Take 150 mg by mouth at bedtime. ) 270 tablet 3  . Calcium Carb-Cholecalciferol 500-600  MG-UNIT TABS Take 1 tablet by mouth daily.    . cholecalciferol (VITAMIN D) 1000 UNITS tablet Take 1,000 Units by mouth daily.    Marland Kitchen dicyclomine (BENTYL) 10 MG capsule Take 1 capsule (10 mg total) by mouth 3 (three) times daily as needed for spasms (diarrhea). 90 capsule 1  . enalapril (VASOTEC) 20 MG tablet Take 1 tablet (20 mg total) by mouth 2 (two) times daily. 180 tablet 3  . escitalopram (LEXAPRO) 20 MG tablet Take 20 mg by mouth every morning.     . mesalamine (APRISO) 0.375 G 24 hr capsule Take 4 capsules (1.5 g total) by mouth daily. 120 capsule 11  . metoprolol (LOPRESSOR) 50 MG tablet Take 50 mg by mouth 2 (two) times daily.    . Multiple Vitamin (MULTIVITAMIN WITH MINERALS) TABS tablet Take 1 tablet by mouth daily.    . Probiotic Product (PROBIOTIC FORMULA PO) Take 1 tablet by mouth daily.     No current facility-administered medications for this visit.    Allergies as of 11/01/2014 - Review Complete 11/01/2014  Allergen Reaction Noted  . Statins Other (See Comments) 03/02/2014  . Aspirin  02/06/2011      Past Surgical History  Procedure Laterality Date  . Salpingoophorectomy      right ovary, both tubes  . Tonsillectomy    . Eye surgery    . Colonoscopy  12/28/09    Dr. [name] colitis  . Coronary artery bypass graft N/A 02/25/2014    Procedure: CORONARY ARTERY BYPASS GRAFTING (CABG);  Surgeon: Tharon Aquas  Kerby Less, MD;  Location: [facility];  Service: Open Heart Surgery;  Laterality: N/A;  . Intraoperative transesophageal echocardiogram N/A 02/25/2014    Procedure: INTRAOPERATIVE TRANSESOPHAGEAL ECHOCARDIOGRAM;  Surgeon: Ivin Poot, MD;  Location: [facility];  Service: Open Heart Surgery;  Laterality: N/A;  . Left heart catheterization with coronary angiogram N/A 02/23/2014    Procedure: LEFT HEART CATHETERIZATION WITH CORONARY ANGIOGRAM;  Surgeon: Burnell Blanks, MD;  Location: [facility] CATH LAB;  Service: Cardiovascular;  Laterality: N/A;    ROS:  General:  Negative for anorexia, weight loss, fever, chills, fatigue, weakness. ENT: Negative for hoarseness, difficulty swallowing , nasal congestion. CV: Negative for chest pain, angina, palpitations, dyspnea on exertion, peripheral edema.  Respiratory: Negative for dyspnea at rest, dyspnea on exertion, cough, sputum, wheezing.  GI: See history of present illness. GU:  Negative for dysuria, hematuria, urinary incontinence, urinary frequency, nocturnal urination.  Endo: Negative for unusual weight change.       Physical Examination:  BP 145/73 mmHg  Pulse 89  Temp(Src) 98.4 F (36.9 C) (Oral)  Ht 5' 6"  (1.676 m)  Wt 212 lb 6.4 oz (96.344 kg)  BMI 34.30 kg/m2   Physical Examination:  General: Well-nourished, well-developed in no acute distress.  Eyes: No icterus. Mouth: Oropharyngeal mucosa moist and pink , no lesions erythema or exudate. Lungs: Clear to auscultation bilaterally.  Heart: Regular rate and rhythm, no murmurs rubs or gallops.  Abdomen: Bowel sounds are normal, diffuse mild tenderness, mostly left abd, nondistended, no hepatosplenomegaly or masses, no abdominal bruits or hernia , no rebound or guarding.   Extremities: No lower extremity edema.  Neuro: Alert and oriented x 4   Skin: Warm and dry, no jaundice.   Psych: Alert and cooperative, normal mood and affect.  Labs: Lab Results  Component Value Date   ALT 10 06/23/2014   AST 15 06/23/2014   ALKPHOS 48 06/23/2014   BILITOT 0.3 06/23/2014   Lab Results  Component Value Date   CREATININE 0.79 05/05/2014   BUN 15 05/05/2014   NA 138 05/05/2014   K 3.7 05/05/2014   CL 107 05/05/2014   CO2 26 05/05/2014   Lab Results  Component Value Date   WBC 7.3 05/05/2014   HGB 10.7* 05/05/2014   HCT 34.5* 05/05/2014   MCV 86.5 05/05/2014   PLT 270 05/05/2014     Imaging Studies: No results found.

## 2014-11-08 NOTE — Progress Notes (Signed)
CC'ED TO PCP 

## 2015-01-12 DIAGNOSIS — D649 Anemia, unspecified: Secondary | ICD-10-CM | POA: Diagnosis not present

## 2015-03-16 ENCOUNTER — Ambulatory Visit: Payer: Medicare HMO | Admitting: Cardiology

## 2015-03-30 DIAGNOSIS — D649 Anemia, unspecified: Secondary | ICD-10-CM | POA: Diagnosis not present

## 2015-03-30 DIAGNOSIS — E782 Mixed hyperlipidemia: Secondary | ICD-10-CM | POA: Diagnosis not present

## 2015-03-30 DIAGNOSIS — E559 Vitamin D deficiency, unspecified: Secondary | ICD-10-CM | POA: Diagnosis not present

## 2015-03-30 DIAGNOSIS — R739 Hyperglycemia, unspecified: Secondary | ICD-10-CM | POA: Diagnosis not present

## 2015-03-30 DIAGNOSIS — Z79899 Other long term (current) drug therapy: Secondary | ICD-10-CM | POA: Diagnosis not present

## 2015-04-05 DIAGNOSIS — D649 Anemia, unspecified: Secondary | ICD-10-CM | POA: Diagnosis not present

## 2015-04-05 DIAGNOSIS — I499 Cardiac arrhythmia, unspecified: Secondary | ICD-10-CM | POA: Diagnosis not present

## 2015-04-05 DIAGNOSIS — J189 Pneumonia, unspecified organism: Secondary | ICD-10-CM | POA: Diagnosis not present

## 2015-04-05 DIAGNOSIS — Z79899 Other long term (current) drug therapy: Secondary | ICD-10-CM | POA: Diagnosis not present

## 2015-04-05 DIAGNOSIS — R739 Hyperglycemia, unspecified: Secondary | ICD-10-CM | POA: Diagnosis not present

## 2015-04-05 DIAGNOSIS — E559 Vitamin D deficiency, unspecified: Secondary | ICD-10-CM | POA: Diagnosis not present

## 2015-04-05 DIAGNOSIS — E782 Mixed hyperlipidemia: Secondary | ICD-10-CM | POA: Diagnosis not present

## 2015-04-05 DIAGNOSIS — I10 Essential (primary) hypertension: Secondary | ICD-10-CM | POA: Diagnosis not present

## 2015-05-02 DIAGNOSIS — Z79899 Other long term (current) drug therapy: Secondary | ICD-10-CM | POA: Diagnosis not present

## 2015-05-02 DIAGNOSIS — I10 Essential (primary) hypertension: Secondary | ICD-10-CM | POA: Diagnosis not present

## 2015-05-05 ENCOUNTER — Encounter: Payer: Self-pay | Admitting: Cardiology

## 2015-05-05 ENCOUNTER — Ambulatory Visit (INDEPENDENT_AMBULATORY_CARE_PROVIDER_SITE_OTHER): Payer: Medicare HMO | Admitting: Cardiology

## 2015-05-05 ENCOUNTER — Other Ambulatory Visit: Payer: Self-pay

## 2015-05-05 VITALS — BP 122/78 | HR 78 | Ht 66.0 in | Wt 225.8 lb

## 2015-05-05 DIAGNOSIS — I480 Paroxysmal atrial fibrillation: Secondary | ICD-10-CM | POA: Diagnosis not present

## 2015-05-05 DIAGNOSIS — E785 Hyperlipidemia, unspecified: Secondary | ICD-10-CM | POA: Diagnosis not present

## 2015-05-05 DIAGNOSIS — Z951 Presence of aortocoronary bypass graft: Secondary | ICD-10-CM | POA: Diagnosis not present

## 2015-05-05 DIAGNOSIS — I214 Non-ST elevation (NSTEMI) myocardial infarction: Secondary | ICD-10-CM | POA: Diagnosis not present

## 2015-05-05 MED ORDER — NITROGLYCERIN 0.4 MG SL SUBL: 0.4000 mg | SUBLINGUAL_TABLET | SUBLINGUAL | Status: DC | PRN

## 2015-05-05 NOTE — Patient Instructions (Addendum)
Medication Instructions:  Your physician has recommended you make the following change in your medication:  1) START Nitro 0.4 mg under the tongue as needed for chest pain - Take one tablet under the tongue every FIVE minutes with a max of three doses   Labwork: none  Testing/Procedures: none  Follow-Up: Your physician wants you to follow-up in: 6 months with Dr. Bronson Ing. You will receive a reminder letter in the mail two months in advance. If you don't receive a letter, please call our office to schedule the follow-up appointment.   Any Other Special Instructions Will Be Listed Below (If Applicable).     If you need a refill on your cardiac medications before your next appointment, please call your pharmacy.

## 2015-05-05 NOTE — Progress Notes (Signed)
Cardiology Office Note   Date:  05/05/2015   ID:  Bonnie Powers, DOB [birth year], MRN [id number]  PCP:  Pauline Good, NP  Cardiologist: Darlin Coco MD  Chief Complaint  Patient presents with  . scheduled follow up    essential hypertension. C/O chest pain mid sternum, le edema. Patient denies shortness of breath.       History of Present Illness: Bonnie Powers is a 70 y.o. female who presents for scheduled 6 month follow-up visit  Presented back in November of 2015 with NSTEMI - found to have multivessel CAD - s/p CABG x 3 with LIMA to LAD, SVG to DX, SVG to RCA per Dr. Prescott Gum. Post op course complicated by blood loss anemia and PAF. She was placed on amiodarone. Amiodarone was subsequently stopped. She has had no recurrence of her atrial fibrillation. She is still smoking occasionally, unfortunately.  She quit this time 2 weeks ago. Her weight is up 13 pounds since last visit. Pressure has been labile.  He has not been having any chest pain or angina. She still has some musculoskeletal chest wall pain in the area of the incision. She had a chest x-ray in February 2016 which was unremarkable. She has had some pain in her right knee and plans to see orthopedist.  She suspects she may need to have right knee replacement. She is scheduled to have colonoscopy next week as routine screening.  Past Medical History  Diagnosis Date  . Ulcerative colitis 2001    Last colonoscopy Dr [name] 12/28/09-> pan-UC, normal TI, maintained on Asacol and Imuran  . HTN (hypertension)   . OA (osteoarthritis)   . Hypercholesteremia   . Hepatitis 1972    ? unknown type  . Otitis media, chronic   . Basal cell cancer     left temple  . Anxiety   . Depression   . Headache     Past Surgical History  Procedure Laterality Date  . Salpingoophorectomy      right ovary, both tubes  . Tonsillectomy    . Eye surgery    . Colonoscopy  12/28/09    Dr. [name] colitis  .  Coronary artery bypass graft N/A 02/25/2014    Procedure: CORONARY ARTERY BYPASS GRAFTING (CABG);  Surgeon: Ivin Poot, MD;  Location: [facility];  Service: Open Heart Surgery;  Laterality: N/A;  . Intraoperative transesophageal echocardiogram N/A 02/25/2014    Procedure: INTRAOPERATIVE TRANSESOPHAGEAL ECHOCARDIOGRAM;  Surgeon: Ivin Poot, MD;  Location: [facility];  Service: Open Heart Surgery;  Laterality: N/A;  . Left heart catheterization with coronary angiogram N/A 02/23/2014    Procedure: LEFT HEART CATHETERIZATION WITH CORONARY ANGIOGRAM;  Surgeon: Burnell Blanks, MD;  Location: [facility] CATH LAB;  Service: Cardiovascular;  Laterality: N/A;     Current Outpatient Prescriptions  Medication Sig Dispense Refill  . acetaminophen (TYLENOL) 500 MG tablet Take 500-1,000 mg by mouth every 6 (six) hours as needed for mild pain or headache.     Marland Kitchen aspirin EC 81 MG tablet Take 1 tablet (81 mg total) by mouth daily. 90 tablet 3  . atorvastatin (LIPITOR) 10 MG tablet Take 10 mg by mouth. Take on Mondays, Wednesdays, and Fridays    . azaTHIOprine (IMURAN) 50 MG tablet Take 3 tablets (150 mg total) by mouth daily. (Patient taking differently: Take 150 mg by mouth at bedtime. ) 270 tablet 3  . Calcium Carb-Cholecalciferol 500-600 MG-UNIT TABS Take 1 tablet by mouth daily.    Marland Kitchen  cholecalciferol (VITAMIN D) 1000 UNITS tablet Take 1,000 Units by mouth daily.    Marland Kitchen dicyclomine (BENTYL) 10 MG capsule Take 1 capsule (10 mg total) by mouth 3 (three) times daily as needed for spasms (diarrhea). 90 capsule 1  . enalapril (VASOTEC) 20 MG tablet Take 1 tablet (20 mg total) by mouth 2 (two) times daily. 180 tablet 3  . hydrochlorothiazide (MICROZIDE) 12.5 MG capsule Take 12.5 mg by mouth daily.  3  . mesalamine (APRISO) 0.375 G 24 hr capsule Take 4 capsules (1.5 g total) by mouth daily. 120 capsule 11  . Multiple Vitamin (MULTIVITAMIN WITH MINERALS) TABS tablet Take 1 tablet by mouth daily.    . Probiotic Product  (PROBIOTIC FORMULA PO) Take 1 tablet by mouth daily.    . nitroGLYCERIN (NITROSTAT) 0.4 MG SL tablet Place 1 tablet (0.4 mg total) under the tongue every 5 (five) minutes as needed for chest pain. 25 tablet 3   No current facility-administered medications for this visit.    Allergies:   Statins and Aspirin    Social History:  The patient  reports that she quit smoking about 14 months ago. Her smoking use included Cigarettes. She has a 30 pack-year smoking history. She has never used smokeless tobacco. She reports that she does not drink alcohol or use illicit drugs.   Family History:  The patient's family history includes HIV in her brother; Heart failure in her mother; Lung cancer in her brother; Skin cancer in her father.    ROS:  Please see the history of present illness.   Otherwise, review of systems are positive for none.   All other systems are reviewed and negative.    PHYSICAL EXAM: VS:  BP 122/78 mmHg  Pulse 78  Ht 5' 6"  (1.676 m)  Wt 225 lb 12.8 oz (102.422 kg)  BMI 36.46 kg/m2 , BMI Body mass index is 36.46 kg/(m^2). GEN: Well nourished, well developed, in no acute distress HEENT: normal Neck: no JVD, carotid bruits, or masses Cardiac: RRR; no murmurs, rubs, or gallops,no edema  Respiratory:  clear to auscultation bilaterally, normal work of breathing GI: soft, nontender, nondistended, + BS MS: no deformity or atrophy Skin: warm and dry, no rash Neuro:  Strength and sensation are intact Psych: euthymic mood, full affect   EKG:  EKG is ordered today. The ekg ordered today demonstrates normal sinus rhythm at 78 bpm.  No ischemic changes.  Since previous tracing of 05/05/14, nonspecific T-wave changes have improved   Recent Labs: 05/05/2014: BUN 15; Creatinine, Ser 0.79; Hemoglobin 10.7*; Platelets 270; Potassium 3.7; Sodium 138 06/23/2014: ALT 10    Lipid Panel    Component Value Date/Time   CHOL 185 06/23/2014 0952   TRIG 78.0 06/23/2014 0952   HDL 50.10  06/23/2014 0952   CHOLHDL 4 06/23/2014 0952   VLDL 15.6 06/23/2014 0952   LDLCALC 119* 06/23/2014 0952      Wt Readings from Last 3 Encounters:  05/05/15 225 lb 12.8 oz (102.422 kg)  11/01/14 212 lb 6.4 oz (96.344 kg)  08/16/14 212 lb (96.163 kg)        ASSESSMENT AND PLAN:  1. NSTEMI with CABG x 3 - well preserved LV function - continues to do well clinically. She has completed cardiac rehabilitation.   2. Tobacco abuse - . She smokes occasionally. Encouraged her to quit altogether  3. PAF - off amiodarone. Remains in NSR by exam .  She has not been aware of any recurrent atrial fibrillation  4. HLD - tolerating alternate day low-dose statin therapy.  Checked by primary care  5. HTN -labile. Repeat blood pressure today 140/76. Continue enalapril 20 mg twice a day. Continue metoprolol.   Current medicines are reviewed at length with the patient today.  The patient does not have concerns regarding medicines.  The following changes have been made:  no change  Labs/ tests ordered today include:   Orders Placed This Encounter  Procedures  . EKG 12-Lead     Disposition:  I encouraged her to get back on her careful diet.  She has gained 13 pounds since last visit.  I encouraged her to continue to be a nonsmoker.  She has not smoked for the past 2 weeks.  We gave her a prescription for Nitrostat to have on hand.  Continue other medicines as is.  She will recheck in 6 months for office visit in [place] with Dr.Koneswaren.   Berna Spare MD 05/05/2015 1:42 PM    [facility] [address], [place], [place]  [address] Phone: [phone]; Fax: [phone]

## 2015-05-10 ENCOUNTER — Telehealth: Payer: Self-pay | Admitting: Cardiology

## 2015-05-10 NOTE — Telephone Encounter (Signed)
Will mail her an AVS as requested

## 2015-05-10 NOTE — Telephone Encounter (Signed)
New message      Pt was seen last Friday.  She lost all of her discharge papers.  Will you please mail it to her?

## 2015-05-15 ENCOUNTER — Other Ambulatory Visit: Payer: Self-pay | Admitting: Gastroenterology

## 2015-06-20 ENCOUNTER — Emergency Department (HOSPITAL_COMMUNITY): Payer: Medicare HMO

## 2015-06-20 ENCOUNTER — Encounter (HOSPITAL_COMMUNITY): Payer: Self-pay | Admitting: Cardiology

## 2015-06-20 ENCOUNTER — Emergency Department (HOSPITAL_COMMUNITY)
Admission: EM | Admit: 2015-06-20 | Discharge: 2015-06-20 | Disposition: A | Discharge status: Home / Self Care | Payer: Medicare HMO | Attending: Emergency Medicine | Admitting: Emergency Medicine

## 2015-06-20 DIAGNOSIS — R079 Chest pain, unspecified: Secondary | ICD-10-CM | POA: Diagnosis not present

## 2015-06-20 DIAGNOSIS — Z7982 Long term (current) use of aspirin: Secondary | ICD-10-CM | POA: Insufficient documentation

## 2015-06-20 DIAGNOSIS — I10 Essential (primary) hypertension: Secondary | ICD-10-CM | POA: Diagnosis not present

## 2015-06-20 DIAGNOSIS — R0781 Pleurodynia: Secondary | ICD-10-CM

## 2015-06-20 DIAGNOSIS — Z79899 Other long term (current) drug therapy: Secondary | ICD-10-CM | POA: Diagnosis not present

## 2015-06-20 DIAGNOSIS — E782 Mixed hyperlipidemia: Secondary | ICD-10-CM | POA: Insufficient documentation

## 2015-06-20 DIAGNOSIS — F329 Major depressive disorder, single episode, unspecified: Secondary | ICD-10-CM | POA: Diagnosis not present

## 2015-06-20 DIAGNOSIS — S299XXA Unspecified injury of thorax, initial encounter: Secondary | ICD-10-CM | POA: Insufficient documentation

## 2015-06-20 DIAGNOSIS — Z87891 Personal history of nicotine dependence: Secondary | ICD-10-CM | POA: Insufficient documentation

## 2015-06-20 DIAGNOSIS — M1990 Unspecified osteoarthritis, unspecified site: Secondary | ICD-10-CM | POA: Insufficient documentation

## 2015-06-20 DIAGNOSIS — W010XXA Fall on same level from slipping, tripping and stumbling without subsequent striking against object, initial encounter: Secondary | ICD-10-CM | POA: Diagnosis not present

## 2015-06-20 DIAGNOSIS — Y939 Activity, unspecified: Secondary | ICD-10-CM | POA: Diagnosis not present

## 2015-06-20 DIAGNOSIS — Y999 Unspecified external cause status: Secondary | ICD-10-CM | POA: Diagnosis not present

## 2015-06-20 DIAGNOSIS — R69 Illness, unspecified: Secondary | ICD-10-CM | POA: Diagnosis not present

## 2015-06-20 DIAGNOSIS — Y929 Unspecified place or not applicable: Secondary | ICD-10-CM | POA: Diagnosis not present

## 2015-06-20 LAB — BASIC METABOLIC PANEL
ANION GAP: 8 (ref 5–15)
BUN: 24 mg/dL — ABNORMAL HIGH (ref 6–20)
CO2: 26 mmol/L (ref 22–32)
CREATININE: 1.03 mg/dL — AB (ref 0.44–1.00)
Calcium: 9.4 mg/dL (ref 8.9–10.3)
Chloride: 104 mmol/L (ref 101–111)
GFR calc non Af Amer: 53 mL/min — ABNORMAL LOW (ref >60)
Glucose, Bld: 116 mg/dL — ABNORMAL HIGH (ref 65–99)
Potassium: 3.7 mmol/L (ref 3.5–5.1)
Sodium: 138 mmol/L (ref 135–145)

## 2015-06-20 LAB — CBC
HCT: 39 % (ref 36.0–46.0)
HEMOGLOBIN: 12.5 g/dL (ref 12.0–15.0)
MCH: 28.2 pg (ref 26.0–34.0)
MCHC: 32.1 g/dL (ref 30.0–36.0)
MCV: 87.8 fL (ref 78.0–100.0)
Platelets: 283 10*3/uL (ref 150–400)
RBC: 4.44 MIL/uL (ref 3.87–5.11)
RDW: 16.3 % — ABNORMAL HIGH (ref 11.5–15.5)
WBC: 6.8 10*3/uL (ref 4.0–10.5)

## 2015-06-20 LAB — TROPONIN I: Troponin I: <0.03 ng/mL (ref <0.031)

## 2015-06-20 MED ORDER — HYDROCODONE-ACETAMINOPHEN 5-325 MG PO TABS
1.0000 | ORAL_TABLET | Freq: Once | ORAL | Status: AC
  Administered 2015-06-20: 1 via ORAL
  Filled 2015-06-20: qty 1

## 2015-06-20 MED ORDER — HYDROCODONE-ACETAMINOPHEN 5-325 MG PO TABS: 1.0000 | ORAL_TABLET | Freq: Three times a day (TID) | ORAL | Status: DC | PRN

## 2015-06-20 NOTE — Discharge Instructions (Signed)

## 2015-06-20 NOTE — ED Notes (Signed)
Patient states no change in pain

## 2015-06-20 NOTE — ED Provider Notes (Signed)
CSN: [id number]     Arrival date & time 06/20/15  1501 History   First MD Initiated Contact with Patient 06/20/15 1948     Chief Complaint  Patient presents with  . Chest Pain  . Rib Cage Pain      (Consider location/radiation/quality/duration/timing/severity/associated sxs/prior Treatment) HPI   Bonnie Powers is a 70 y.o. female who presents for evaluation of right-sided chest pain since falling, 2 weeks ago, when she tripped and around. The pain has been persistent, and is now radiating to the right posterior and right upper chest area. There's been no associated fever, chills, diaphoresis, nausea, vomiting, anorexia, weakness or dizziness. There are no other no modifying factors.   Past Medical History  Diagnosis Date  . Ulcerative colitis 2001    Last colonoscopy Dr [name] 12/28/09-> pan-UC, normal TI, maintained on Asacol and Imuran  . HTN (hypertension)   . OA (osteoarthritis)   . Hypercholesteremia   . Hepatitis 1972    ? unknown type  . Otitis media, chronic   . Basal cell cancer     left temple  . Anxiety   . Depression   . Headache    Past Surgical History  Procedure Laterality Date  . Salpingoophorectomy      right ovary, both tubes  . Tonsillectomy    . Eye surgery    . Colonoscopy  12/28/09    Dr. [name] colitis  . Coronary artery bypass graft N/A 02/25/2014    Procedure: CORONARY ARTERY BYPASS GRAFTING (CABG);  Surgeon: Ivin Poot, MD;  Location: [place];  Service: Open Heart Surgery;  Laterality: N/A;  . Intraoperative transesophageal echocardiogram N/A 02/25/2014    Procedure: INTRAOPERATIVE TRANSESOPHAGEAL ECHOCARDIOGRAM;  Surgeon: Ivin Poot, MD;  Location: [place];  Service: Open Heart Surgery;  Laterality: N/A;  . Left heart catheterization with coronary angiogram N/A 02/23/2014    Procedure: LEFT HEART CATHETERIZATION WITH CORONARY ANGIOGRAM;  Surgeon: Burnell Blanks, MD;  Location: [facility] CATH LAB;  Service: Cardiovascular;   Laterality: N/A;   Family History  Problem Relation Age of Onset  . Skin cancer Father   . Heart failure Mother   . HIV Brother   . Lung cancer Brother    Social History  Substance Use Topics  . Smoking status: Former Smoker -- 1.00 packs/day for 30 years    Types: Cigarettes    Quit date: 02/22/2014  . Smokeless tobacco: Never Used     Comment: trying  . Alcohol Use: No   OB History    No data available     Review of Systems  All other systems reviewed and are negative.     Allergies  Statins and Aspirin  Home Medications   Prior to Admission medications   Medication Sig Start Date End Date Taking? Authorizing Provider  acetaminophen (TYLENOL) 500 MG tablet Take 500-1,000 mg by mouth every 6 (six) hours as needed for mild pain or headache.    Yes Historical Provider, MD  aspirin EC 81 MG tablet Take 1 tablet (81 mg total) by mouth daily. 03/18/14  Yes Burtis Junes, NP  atorvastatin (LIPITOR) 10 MG tablet Take 10 mg by mouth. Take on Mondays, Wednesdays, and Fridays   Yes Historical Provider, MD  azaTHIOprine (IMURAN) 50 MG tablet TAKE 3 TABLETS (150 MG TOTAL) BY MOUTH DAILY. 05/16/15  Yes Orvil Feil, NP  Calcium Carb-Cholecalciferol 500-600 MG-UNIT TABS Take 1 tablet by mouth daily.   Yes Historical Provider, MD  cholecalciferol (  VITAMIN D) 1000 UNITS tablet Take 1,000 Units by mouth daily.   Yes Historical Provider, MD  dicyclomine (BENTYL) 10 MG capsule Take 1 capsule (10 mg total) by mouth 3 (three) times daily as needed for spasms (diarrhea). 04/07/14  Yes Mahala Menghini, PA-C  enalapril (VASOTEC) 20 MG tablet Take 1 tablet (20 mg total) by mouth 2 (two) times daily. 08/16/14  Yes Darlin Coco, MD  hydrochlorothiazide (MICROZIDE) 12.5 MG capsule Take 12.5 mg by mouth daily. 04/17/15  Yes Historical Provider, MD  mesalamine (APRISO) 0.375 G 24 hr capsule Take 4 capsules (1.5 g total) by mouth daily. 07/28/14  Yes Mahala Menghini, PA-C  Multiple Vitamin (MULTIVITAMIN  WITH MINERALS) TABS tablet Take 1 tablet by mouth daily.   Yes Historical Provider, MD  nitroGLYCERIN (NITROSTAT) 0.4 MG SL tablet Place 1 tablet (0.4 mg total) under the tongue every 5 (five) minutes as needed for chest pain. 05/05/15  Yes Darlin Coco, MD  Probiotic Product (PROBIOTIC FORMULA PO) Take 1 tablet by mouth daily.   Yes Historical Provider, MD  HYDROcodone-acetaminophen (NORCO) 5-325 MG tablet Take 1 tablet by mouth every 8 (eight) hours as needed. 06/20/15   Daleen Bo, MD   BP 118/53 mmHg  Pulse 93  Temp(Src) 98.7 F (37.1 C) (Oral)  Resp 20  Ht 5' 6"  (1.676 m)  Wt 226 lb (102.513 kg)  BMI 36.49 kg/m2  SpO2 97% Physical Exam  Constitutional: She is oriented to person, place, and time. She appears well-developed and well-nourished.  HENT:  Head: Normocephalic and atraumatic.  Right Ear: External ear normal.  Left Ear: External ear normal.  Eyes: Conjunctivae and EOM are normal. Pupils are equal, round, and reactive to light.  Neck: Normal range of motion and phonation normal. Neck supple.  Cardiovascular: Normal rate, regular rhythm and normal heart sounds.   Pulmonary/Chest: Effort normal and breath sounds normal. She exhibits tenderness (mild diffuse right-sided chest tenderness without crepitation or deformity.). She exhibits no bony tenderness.  Abdominal: Soft. There is no tenderness.  Musculoskeletal: Normal range of motion.  Neurological: She is alert and oriented to person, place, and time. No cranial nerve deficit or sensory deficit. She exhibits normal muscle tone. Coordination normal.  Normal gait  Skin: Skin is warm, dry and intact.  Psychiatric: She has a normal mood and affect. Her behavior is normal. Judgment and thought content normal.  Nursing note and vitals reviewed.   ED Course  Procedures (including critical care time)  Medications  HYDROcodone-acetaminophen (NORCO/VICODIN) 5-325 MG per tablet 1 tablet (1 tablet Oral Given 06/20/15 1959)     Patient Vitals for the past 24 hrs:  BP Temp Temp src Pulse Resp SpO2 Height Weight  06/20/15 2102 (!) 118/53 mmHg 98.7 F (37.1 C) Oral 93 20 97 % - -  06/20/15 1946 145/72 mmHg 98.4 F (36.9 C) Oral 98 16 98 % - -  06/20/15 1503 147/70 mmHg 98 F (36.7 C) Oral 104 16 97 % 5' 6"  (1.676 m) 226 lb (102.513 kg)    10:13 PM Reevaluation with update and discussion. After initial assessment and treatment, an updated evaluation reveals pain is somewhat better. Findings discussed with patient, and husband, all questions answered. Lucifer Soja L    Labs Review Labs Reviewed  BASIC METABOLIC PANEL - Abnormal; Notable for the following:    Glucose, Bld 116 (*)    BUN 24 (*)    Creatinine, Ser 1.03 (*)    GFR calc non Af Amer 53 (*)  All other components within normal limits  CBC - Abnormal; Notable for the following:    RDW 16.3 (*)    All other components within normal limits  TROPONIN I    Imaging Review Dg Chest 2 View  06/20/2015  CLINICAL DATA:  Trip and fall over carpet with right-sided chest pain, initial encounter EXAM: CHEST  2 VIEW COMPARISON:  05/26/2014 FINDINGS: Cardiac shadow is within normal limits. Postsurgical changes are noted. The lungs are clear. No pneumothorax is seen. No acute bony abnormality is noted. Chronic changes are seen the in the thoracic spine. IMPRESSION: No active cardiopulmonary disease. Electronically Signed   By: Inez Catalina M.D.   On: 06/20/2015 15:48   Dg Ribs Unilateral Right  06/20/2015  CLINICAL DATA:  Acute right rib pain after fall. EXAM: RIGHT RIBS - 2 VIEW COMPARISON:  April 13, 2014. FINDINGS: No fracture or other bone lesions are seen involving the ribs. No pneumothorax or pleural effusion is noted. IMPRESSION: Normal right ribs. Electronically Signed   By: Marijo Conception, M.D.   On: 06/20/2015 20:51   I have personally reviewed and evaluated these images and lab results as part of my medical decision-making.   EKG  Interpretation None      MDM   Final diagnoses:  Rib pain on right side  Chest injury, initial encounter    Chest injury, subacute with ongoing pain, without evidence for pneumonia, pneumothorax or rib fracture.  Nursing Notes Reviewed/ Care Coordinated Applicable Imaging Reviewed Interpretation of Laboratory Data incorporated into ED treatment  The patient appears reasonably screened and/or stabilized for discharge and I doubt any other medical condition or other [facility] requiring further screening, evaluation, or treatment in the ED at this time prior to discharge.  Plan: Home Medications- Norco; Home Treatments- rest; return here if the recommended treatment, does not improve the symptoms; Recommended follow up- PCP prn    Daleen Bo, MD 06/20/15 2217

## 2015-06-20 NOTE — ED Notes (Signed)
Pt states that she fell last Saturday causing her to have right rib pain. Within the last 3 days she has began to have chest pain as well on right side of her chest. NAD noted.

## 2015-07-13 DIAGNOSIS — H524 Presbyopia: Secondary | ICD-10-CM | POA: Diagnosis not present

## 2015-07-13 DIAGNOSIS — H2513 Age-related nuclear cataract, bilateral: Secondary | ICD-10-CM | POA: Diagnosis not present

## 2015-07-13 DIAGNOSIS — Z0100 Encounter for examination of eyes and vision without abnormal findings: Secondary | ICD-10-CM | POA: Diagnosis not present

## 2015-07-27 DIAGNOSIS — E782 Mixed hyperlipidemia: Secondary | ICD-10-CM | POA: Diagnosis not present

## 2015-07-27 DIAGNOSIS — D649 Anemia, unspecified: Secondary | ICD-10-CM | POA: Diagnosis not present

## 2015-07-27 DIAGNOSIS — R739 Hyperglycemia, unspecified: Secondary | ICD-10-CM | POA: Diagnosis not present

## 2015-07-27 DIAGNOSIS — Z79899 Other long term (current) drug therapy: Secondary | ICD-10-CM | POA: Diagnosis not present

## 2015-07-27 DIAGNOSIS — E559 Vitamin D deficiency, unspecified: Secondary | ICD-10-CM | POA: Diagnosis not present

## 2015-08-01 DIAGNOSIS — W19XXXA Unspecified fall, initial encounter: Secondary | ICD-10-CM | POA: Diagnosis not present

## 2015-08-01 DIAGNOSIS — J301 Allergic rhinitis due to pollen: Secondary | ICD-10-CM | POA: Diagnosis not present

## 2015-08-01 DIAGNOSIS — R739 Hyperglycemia, unspecified: Secondary | ICD-10-CM | POA: Diagnosis not present

## 2015-08-01 DIAGNOSIS — Z79899 Other long term (current) drug therapy: Secondary | ICD-10-CM | POA: Diagnosis not present

## 2015-08-01 DIAGNOSIS — E559 Vitamin D deficiency, unspecified: Secondary | ICD-10-CM | POA: Diagnosis not present

## 2015-08-01 DIAGNOSIS — E782 Mixed hyperlipidemia: Secondary | ICD-10-CM | POA: Diagnosis not present

## 2015-08-01 DIAGNOSIS — I10 Essential (primary) hypertension: Secondary | ICD-10-CM | POA: Diagnosis not present

## 2015-08-01 DIAGNOSIS — M8589 Other specified disorders of bone density and structure, multiple sites: Secondary | ICD-10-CM | POA: Diagnosis not present

## 2015-08-01 DIAGNOSIS — D649 Anemia, unspecified: Secondary | ICD-10-CM | POA: Diagnosis not present

## 2015-08-03 ENCOUNTER — Other Ambulatory Visit (HOSPITAL_COMMUNITY): Payer: Self-pay | Admitting: Nurse Practitioner

## 2015-08-03 DIAGNOSIS — Z1231 Encounter for screening mammogram for malignant neoplasm of breast: Secondary | ICD-10-CM

## 2015-08-08 DIAGNOSIS — H25811 Combined forms of age-related cataract, right eye: Secondary | ICD-10-CM | POA: Diagnosis not present

## 2015-08-22 DIAGNOSIS — Z0000 Encounter for general adult medical examination without abnormal findings: Secondary | ICD-10-CM | POA: Diagnosis not present

## 2015-08-22 DIAGNOSIS — Z01818 Encounter for other preprocedural examination: Secondary | ICD-10-CM | POA: Diagnosis not present

## 2015-08-22 DIAGNOSIS — H269 Unspecified cataract: Secondary | ICD-10-CM | POA: Diagnosis not present

## 2015-09-06 ENCOUNTER — Ambulatory Visit (HOSPITAL_COMMUNITY): Payer: Medicare HMO

## 2015-09-06 ENCOUNTER — Ambulatory Visit (HOSPITAL_COMMUNITY)
Admission: RE | Admit: 2015-09-06 | Discharge: 2015-09-06 | Disposition: A | Discharge status: Home / Self Care | Payer: Medicare HMO | Source: Ambulatory Visit | Attending: Nurse Practitioner | Admitting: Nurse Practitioner

## 2015-09-06 DIAGNOSIS — M899 Disorder of bone, unspecified: Secondary | ICD-10-CM | POA: Diagnosis not present

## 2015-09-06 DIAGNOSIS — Z1231 Encounter for screening mammogram for malignant neoplasm of breast: Secondary | ICD-10-CM | POA: Diagnosis not present

## 2015-09-11 DIAGNOSIS — H2511 Age-related nuclear cataract, right eye: Secondary | ICD-10-CM | POA: Diagnosis not present

## 2015-09-11 DIAGNOSIS — R69 Illness, unspecified: Secondary | ICD-10-CM | POA: Diagnosis not present

## 2015-09-11 DIAGNOSIS — I10 Essential (primary) hypertension: Secondary | ICD-10-CM | POA: Diagnosis not present

## 2015-09-11 DIAGNOSIS — Z9849 Cataract extraction status, unspecified eye: Secondary | ICD-10-CM

## 2015-09-11 DIAGNOSIS — Z951 Presence of aortocoronary bypass graft: Secondary | ICD-10-CM | POA: Diagnosis not present

## 2015-09-11 DIAGNOSIS — E785 Hyperlipidemia, unspecified: Secondary | ICD-10-CM | POA: Diagnosis not present

## 2015-09-11 DIAGNOSIS — M1990 Unspecified osteoarthritis, unspecified site: Secondary | ICD-10-CM | POA: Diagnosis not present

## 2015-09-11 DIAGNOSIS — Z79899 Other long term (current) drug therapy: Secondary | ICD-10-CM | POA: Diagnosis not present

## 2015-09-11 DIAGNOSIS — K5190 Ulcerative colitis, unspecified, without complications: Secondary | ICD-10-CM | POA: Diagnosis not present

## 2015-09-11 DIAGNOSIS — Z7982 Long term (current) use of aspirin: Secondary | ICD-10-CM | POA: Diagnosis not present

## 2015-09-11 HISTORY — DX: Cataract extraction status, unspecified eye: Z98.49

## 2015-09-18 DIAGNOSIS — H25812 Combined forms of age-related cataract, left eye: Secondary | ICD-10-CM | POA: Diagnosis not present

## 2015-09-25 DIAGNOSIS — R69 Illness, unspecified: Secondary | ICD-10-CM | POA: Diagnosis not present

## 2015-09-25 DIAGNOSIS — M1990 Unspecified osteoarthritis, unspecified site: Secondary | ICD-10-CM | POA: Diagnosis not present

## 2015-09-25 DIAGNOSIS — K5190 Ulcerative colitis, unspecified, without complications: Secondary | ICD-10-CM | POA: Diagnosis not present

## 2015-09-25 DIAGNOSIS — I10 Essential (primary) hypertension: Secondary | ICD-10-CM | POA: Diagnosis not present

## 2015-09-25 DIAGNOSIS — Z79899 Other long term (current) drug therapy: Secondary | ICD-10-CM | POA: Diagnosis not present

## 2015-09-25 DIAGNOSIS — H2512 Age-related nuclear cataract, left eye: Secondary | ICD-10-CM | POA: Diagnosis not present

## 2015-09-25 DIAGNOSIS — H2511 Age-related nuclear cataract, right eye: Secondary | ICD-10-CM | POA: Diagnosis not present

## 2015-09-25 DIAGNOSIS — Z7982 Long term (current) use of aspirin: Secondary | ICD-10-CM | POA: Diagnosis not present

## 2015-11-07 ENCOUNTER — Encounter (INDEPENDENT_AMBULATORY_CARE_PROVIDER_SITE_OTHER): Payer: Self-pay

## 2015-11-07 ENCOUNTER — Encounter: Payer: Self-pay | Admitting: Cardiovascular Disease

## 2015-11-07 ENCOUNTER — Ambulatory Visit (INDEPENDENT_AMBULATORY_CARE_PROVIDER_SITE_OTHER): Payer: Medicare HMO | Admitting: Cardiovascular Disease

## 2015-11-07 ENCOUNTER — Telehealth: Payer: Self-pay

## 2015-11-07 VITALS — BP 142/82 | HR 108 | Ht 66.0 in | Wt 216.0 lb

## 2015-11-07 DIAGNOSIS — E785 Hyperlipidemia, unspecified: Secondary | ICD-10-CM | POA: Diagnosis not present

## 2015-11-07 DIAGNOSIS — I252 Old myocardial infarction: Secondary | ICD-10-CM | POA: Diagnosis not present

## 2015-11-07 DIAGNOSIS — I25709 Atherosclerosis of coronary artery bypass graft(s), unspecified, with unspecified angina pectoris: Secondary | ICD-10-CM | POA: Diagnosis not present

## 2015-11-07 DIAGNOSIS — I10 Essential (primary) hypertension: Secondary | ICD-10-CM | POA: Diagnosis not present

## 2015-11-07 NOTE — Progress Notes (Signed)
SUBJECTIVE: The patient is a 70 year old woman who used to follow with Dr. Mare Ferrari. This is my first time meeting her. She has a history of non-STEMI in November 2015 with multivessel coronary artery disease for which she underwent 3 vessel CABG with LIMA to LAD, SVG to diagonal, and SVG to RCA. She denies exertional chest pain and shortness of breath. She denies pretibial edema. She has no limitation of activities with respect to playing with her grandchildren.  Soc: Originally from [place], [place]. Numerous grandchildren in [place], [place], and [place]. Has a daughter in [place], [place].  Review of Systems: As per "subjective", otherwise negative.  Allergies  Allergen Reactions  . Statins Other (See Comments)    Muscle aches  . Aspirin     Current Outpatient Prescriptions  Medication Sig Dispense Refill  . acetaminophen (TYLENOL) 500 MG tablet Take 500-1,000 mg by mouth every 6 (six) hours as needed for mild pain or headache.     . ALPRAZolam (XANAX) 0.5 MG tablet Take 0.5 mg by mouth daily as needed for anxiety.    Marland Kitchen aspirin EC 81 MG tablet Take 1 tablet (81 mg total) by mouth daily. 90 tablet 3  . azaTHIOprine (IMURAN) 50 MG tablet TAKE 3 TABLETS (150 MG TOTAL) BY MOUTH DAILY. 270 tablet 3  . Calcium Carb-Cholecalciferol 500-600 MG-UNIT TABS Take 1 tablet by mouth daily.    . cholecalciferol (VITAMIN D) 1000 UNITS tablet Take 1,000 Units by mouth daily.    Marland Kitchen dicyclomine (BENTYL) 10 MG capsule Take 1 capsule (10 mg total) by mouth 3 (three) times daily as needed for spasms (diarrhea). 90 capsule 1  . enalapril (VASOTEC) 20 MG tablet Take 1 tablet (20 mg total) by mouth 2 (two) times daily. 180 tablet 3  . mesalamine (APRISO) 0.375 G 24 hr capsule Take 4 capsules (1.5 g total) by mouth daily. 120 capsule 11  . Multiple Vitamin (MULTIVITAMIN WITH MINERALS) TABS tablet Take 1 tablet by mouth daily.    . nitroGLYCERIN (NITROSTAT) 0.4 MG SL tablet Place 1 tablet (0.4 mg total) under the tongue  every 5 (five) minutes as needed for chest pain. 25 tablet 3  . Probiotic Product (PROBIOTIC FORMULA PO) Take 1 tablet by mouth daily.     No current facility-administered medications for this visit.     Past Medical History:  Diagnosis Date  . Anxiety   . Basal cell cancer    left temple  . Depression   . Headache   . Hepatitis 1972   ? unknown type  . HTN (hypertension)   . Hx of cataract surgery 09/11/2015  . Hypercholesteremia   . OA (osteoarthritis)   . Otitis media, chronic   . Ulcerative colitis 2001   Last colonoscopy Dr [name] 12/28/09-> pan-UC, normal TI, maintained on Asacol and Imuran    Past Surgical History:  Procedure Laterality Date  . COLONOSCOPY  12/28/09   Dr. [name] colitis  . CORONARY ARTERY BYPASS GRAFT N/A 02/25/2014   Procedure: CORONARY ARTERY BYPASS GRAFTING (CABG);  Surgeon: Ivin Poot, MD;  Location: [place];  Service: Open Heart Surgery;  Laterality: N/A;  . EYE SURGERY    . INTRAOPERATIVE TRANSESOPHAGEAL ECHOCARDIOGRAM N/A 02/25/2014   Procedure: INTRAOPERATIVE TRANSESOPHAGEAL ECHOCARDIOGRAM;  Surgeon: Ivin Poot, MD;  Location: [place];  Service: Open Heart Surgery;  Laterality: N/A;  . LEFT HEART CATHETERIZATION WITH CORONARY ANGIOGRAM N/A 02/23/2014   Procedure: LEFT HEART CATHETERIZATION WITH CORONARY ANGIOGRAM;  Surgeon: Harrell Gave  Santina Evans, MD;  Location: [place] CATH LAB;  Service: Cardiovascular;  Laterality: N/A;  . SALPINGOOPHORECTOMY     right ovary, both tubes  . TONSILLECTOMY      Social History   Social History  . Marital status: Divorced    Spouse name: N/A  . Number of children: 3  . Years of education: N/A   Occupational History  . retired Retired    Conservator, museum/gallery   Social History Main Topics  . Smoking status: Former Smoker    Packs/day: 1.00    Years: 30.00    Types: Cigarettes    Quit date: 02/22/2014  . Smokeless tobacco: Never Used     Comment: trying  . Alcohol use No  . Drug use: No  .  Sexual activity: No   Other Topics Concern  . Not on file   Social History Narrative   Lost 1 daughter w/ meningitis     Vitals:   11/07/15 1038  BP: (!) 142/82  Pulse: (!) 108  SpO2: 97%  Weight: 216 lb (98 kg)  Height: 5' 6"  (1.676 m)    PHYSICAL EXAM General: NAD HEENT: Normal. Neck: No JVD, no thyromegaly. Lungs: Clear to auscultation bilaterally with normal respiratory effort. CV: Nondisplaced PMI.  Regular rate and rhythm, normal S1/S2, no S3/S4, no murmur. No pretibial or periankle edema.  No carotid bruit.   Abdomen: Soft, nontender, no distention.  Neurologic: Alert and oriented.  Psych: Normal affect. Skin: Normal. Musculoskeletal: No gross deformities.    ECG: Most recent ECG reviewed.      ASSESSMENT AND PLAN: 1. CAD with 3-vessel CABG: Symptomatically stable. Continue aspirin. Unclear why she is not on a beta blocker or statin. Continue enalapril. Had apparently been on metoprolol and Lipitor in the past.  2. Essential HTN: Mildly elevated. Will monitor.  3. Hyperlipidemia: Not on statin therapy, uncertain as to why. Had been on Lipitor in the past.  Dispo: fu 1 year.  Kate Sable, M.D., F.A.C.C.

## 2015-11-07 NOTE — Telephone Encounter (Signed)
Pt quit taking the Lipitor months ago due to chronic paind in her joints and muscles of her legs. She stated she tried several times to take it but was not able to tolerate it. The metoprolol was stopped by " one  of her doctor's when she came out of the hospital. She has not taken the metoprolol in months.

## 2015-11-07 NOTE — Patient Instructions (Signed)
Your physician wants you to follow-up in:  1 year Dr Ferne Reus will receive a reminder letter in the mail two months in advance. If you don't receive a letter, please call our office to schedule the follow-up appointment.    Your physician recommends that you continue on your current medications as directed. Please refer to the Current Medication list given to you today.    If you need a refill on your cardiac medications before your next appointment, please call your pharmacy.    Thank you for choosing [facility] !

## 2015-11-07 NOTE — Telephone Encounter (Signed)
-----   Message from Herminio Commons, MD sent at 11/07/2015 11:04 AM EDT ----- Regarding: metoprolol and Lipitor She had been on these in the past. Can you call and ask her why she's not on them anymore? Did she just run out? No rush, take your time doing this. Thanks.

## 2015-12-08 DIAGNOSIS — D649 Anemia, unspecified: Secondary | ICD-10-CM | POA: Diagnosis not present

## 2015-12-08 DIAGNOSIS — E559 Vitamin D deficiency, unspecified: Secondary | ICD-10-CM | POA: Diagnosis not present

## 2015-12-08 DIAGNOSIS — R739 Hyperglycemia, unspecified: Secondary | ICD-10-CM | POA: Diagnosis not present

## 2015-12-08 DIAGNOSIS — E782 Mixed hyperlipidemia: Secondary | ICD-10-CM | POA: Diagnosis not present

## 2015-12-08 DIAGNOSIS — Z79899 Other long term (current) drug therapy: Secondary | ICD-10-CM | POA: Diagnosis not present

## 2015-12-19 DIAGNOSIS — I10 Essential (primary) hypertension: Secondary | ICD-10-CM | POA: Diagnosis not present

## 2015-12-19 DIAGNOSIS — R69 Illness, unspecified: Secondary | ICD-10-CM | POA: Diagnosis not present

## 2015-12-19 DIAGNOSIS — E559 Vitamin D deficiency, unspecified: Secondary | ICD-10-CM | POA: Diagnosis not present

## 2015-12-19 DIAGNOSIS — M8589 Other specified disorders of bone density and structure, multiple sites: Secondary | ICD-10-CM | POA: Diagnosis not present

## 2015-12-19 DIAGNOSIS — E782 Mixed hyperlipidemia: Secondary | ICD-10-CM | POA: Diagnosis not present

## 2015-12-19 DIAGNOSIS — D649 Anemia, unspecified: Secondary | ICD-10-CM | POA: Diagnosis not present

## 2015-12-19 DIAGNOSIS — R739 Hyperglycemia, unspecified: Secondary | ICD-10-CM | POA: Diagnosis not present

## 2015-12-19 DIAGNOSIS — Z79899 Other long term (current) drug therapy: Secondary | ICD-10-CM | POA: Diagnosis not present

## 2016-01-15 ENCOUNTER — Other Ambulatory Visit: Payer: Self-pay

## 2016-01-15 ENCOUNTER — Ambulatory Visit (INDEPENDENT_AMBULATORY_CARE_PROVIDER_SITE_OTHER): Payer: Medicare HMO | Admitting: Gastroenterology

## 2016-01-15 ENCOUNTER — Encounter: Payer: Self-pay | Admitting: Gastroenterology

## 2016-01-15 VITALS — BP 129/73 | HR 99 | Temp 97.9°F | Ht 66.0 in | Wt 204.8 lb

## 2016-01-15 DIAGNOSIS — K51011 Ulcerative (chronic) pancolitis with rectal bleeding: Secondary | ICD-10-CM

## 2016-01-15 DIAGNOSIS — K625 Hemorrhage of anus and rectum: Secondary | ICD-10-CM

## 2016-01-15 MED ORDER — PREDNISONE 10 MG PO TABS: ORAL_TABLET | ORAL | 0 refills | Status: DC

## 2016-01-15 MED ORDER — PEG 3350-KCL-NA BICARB-NACL 420 G PO SOLR: 4000.0000 mL | ORAL | 0 refills | Status: DC

## 2016-01-15 NOTE — Assessment & Plan Note (Signed)
History of pan ulcerative colitis. Currently with active flare of 3 weeks, now off of her maintenance therapy 3 months due to cost concerns. She is well overdue for surveillance colonoscopy and would like to get that scheduled. We will start her on prednisone taper to get current flare under control. Will plan on colonoscopy in 3-4 weeks for surveillance purposes. Patient really wants to get this done now she is afraid she will have time later and she is well overdue. I will also obtain copy of her most recent labs from PCP for review. We'll need to work on obtaining maintenance medication again and we will have Almyra Free look into her insurance concerns.  Colonoscopy in 3-4 weeks.  I have discussed the risks, alternatives, benefits with regards to but not limited to the risk of reaction to medication, bleeding, infection, perforation and the patient is agreeable to proceed. Written consent to be obtained.

## 2016-01-15 NOTE — Progress Notes (Signed)
Primary Care Physician: Renee Rival, NP  Primary Gastroenterologist:  Garfield Cornea, MD   Chief Complaint  Patient presents with  . Ulcerative Colitis    cramping,diarrhea (started 3 weeks ago)    HPI: Bonnie Powers is a 70 y.o. female here for follow-up of ulcerative colitis. Last seen in July 2016.She is overdue for surveillance colonoscopy. She's had multiple medical issues herself as well as her husband over the last couple of years. She presents today with complaints of UC flare. She came off her medications both Apriso and Imuran 3 months ago. States the price went up and she couldn't afford it. Apriso cost went up from $47 to over $200. She's had a lot of medical expenses due to cataract surgeries, her husband had multiple surgeries. Now on xanax for stress, no more than one per day. Over the past 3 weeks she developed a UC flare. Some brbpr in the stools. Stools are very soft. Abdominal cramping. BM 3-4 times within one hour. Not getting her up at night. Appetite okay. No n/v. No fever.   She tells me she is ready to schedule her colonoscopy. She prefers prednisone for UC flare even though before she told me she had side effects(irritability) that were unwanted. Took too long for Uceris to help her last time.   Current Outpatient Prescriptions  Medication Sig Dispense Refill  . acetaminophen (TYLENOL) 500 MG tablet Take 500-1,000 mg by mouth every 6 (six) hours as needed for mild pain or headache.     . ALPRAZolam (XANAX) 0.5 MG tablet Take 0.5 mg by mouth daily as needed for anxiety.    Marland Kitchen aspirin EC 81 MG tablet Take 1 tablet (81 mg total) by mouth daily. 90 tablet 3  . Calcium Carb-Cholecalciferol 500-600 MG-UNIT TABS Take 1 tablet by mouth daily.    . cholecalciferol (VITAMIN D) 1000 UNITS tablet Take 1,000 Units by mouth daily.    . hydrochlorothiazide (MICROZIDE) 12.5 MG capsule Take 12.5 mg by mouth daily.  6  . Multiple Vitamin (MULTIVITAMIN WITH MINERALS)  TABS tablet Take 1 tablet by mouth daily.    . nitroGLYCERIN (NITROSTAT) 0.4 MG SL tablet Place 1 tablet (0.4 mg total) under the tongue every 5 (five) minutes as needed for chest pain. 25 tablet 3  . Probiotic Product (PROBIOTIC FORMULA PO) Take 1 tablet by mouth daily.     No current facility-administered medications for this visit.     Allergies as of 01/15/2016 - Review Complete 01/15/2016  Allergen Reaction Noted  . Statins Other (See Comments) 03/02/2014  . Aspirin  02/06/2011   Past Medical History:  Diagnosis Date  . Anxiety   . Basal cell cancer    left temple  . Depression   . Headache   . Hepatitis 1972   ? unknown type  . HTN (hypertension)   . Hx of cataract surgery 09/11/2015  . Hypercholesteremia   . OA (osteoarthritis)   . Otitis media, chronic   . Ulcerative colitis 2001   Last colonoscopy Dr [name] 12/28/09-> pan-UC, normal TI, maintained on Asacol and Imuran   Past Surgical History:  Procedure Laterality Date  . COLONOSCOPY  12/28/09   Dr. [name] colitis  . CORONARY ARTERY BYPASS GRAFT N/A 02/25/2014   Procedure: CORONARY ARTERY BYPASS GRAFTING (CABG);  Surgeon: Ivin Poot, MD;  Location: [place];  Service: Open Heart Surgery;  Laterality: N/A;  . EYE SURGERY    . INTRAOPERATIVE TRANSESOPHAGEAL ECHOCARDIOGRAM N/A 02/25/2014  Procedure: INTRAOPERATIVE TRANSESOPHAGEAL ECHOCARDIOGRAM;  Surgeon: Ivin Poot, MD;  Location: [place];  Service: Open Heart Surgery;  Laterality: N/A;  . LEFT HEART CATHETERIZATION WITH CORONARY ANGIOGRAM N/A 02/23/2014   Procedure: LEFT HEART CATHETERIZATION WITH CORONARY ANGIOGRAM;  Surgeon: Burnell Blanks, MD;  Location: [facility] CATH LAB;  Service: Cardiovascular;  Laterality: N/A;  . SALPINGOOPHORECTOMY     right ovary, both tubes  . TONSILLECTOMY     Family History  Problem Relation Age of Onset  . Skin cancer Father   . Heart failure Mother   . HIV Brother   . Lung cancer Brother    Social History    Social History  . Marital status: Divorced    Spouse name: N/A  . Number of children: 3  . Years of education: N/A   Occupational History  . retired Retired    Conservator, museum/gallery   Social History Main Topics  . Smoking status: Former Smoker    Packs/day: 1.00    Years: 30.00    Types: Cigarettes    Quit date: 02/22/2014  . Smokeless tobacco: Never Used     Comment: trying  . Alcohol use No  . Drug use: No  . Sexual activity: No   Other Topics Concern  . None   Social History Narrative   Lost 1 daughter w/ meningitis    ROS:  General: Negative for anorexia, weight loss, fever, chills, fatigue, weakness. ENT: Negative for hoarseness, difficulty swallowing , nasal congestion. CV: Negative for chest pain, angina, palpitations, dyspnea on exertion, peripheral edema.  Respiratory: Negative for dyspnea at rest, dyspnea on exertion, cough, sputum, wheezing.  GI: See history of present illness. GU:  Negative for dysuria, hematuria, urinary incontinence, urinary frequency, nocturnal urination.  Endo: Negative for unusual weight change.    Physical Examination:   BP 129/73   Pulse 99   Temp 97.9 F (36.6 C) (Oral)   Ht 5' 6"  (1.676 m)   Wt 204 lb 12.8 oz (92.9 kg)   BMI 33.06 kg/m   General: Well-nourished, well-developed in no acute distress.  Eyes: No icterus. Mouth: Oropharyngeal mucosa moist and pink , no lesions erythema or exudate. Lungs: Clear to auscultation bilaterally.  Heart: Regular rate and rhythm, no murmurs rubs or gallops.  Abdomen: Bowel sounds are normal, mild LLQ tender, nondistended, no hepatosplenomegaly or masses, no abdominal bruits or hernia , no rebound or guarding.   Extremities: No lower extremity edema. No clubbing or deformities. Neuro: Alert and oriented x 4   Skin: Warm and dry, no jaundice.   Psych: Alert and cooperative, normal mood and affect.  Labs:  Lab Results  Component Value Date   WBC 6.8 06/20/2015   HGB 12.5 06/20/2015    HCT 39.0 06/20/2015   MCV 87.8 06/20/2015   PLT 283 06/20/2015   Lab Results  Component Value Date   CREATININE 1.03 (H) 06/20/2015   BUN 24 (H) 06/20/2015   NA 138 06/20/2015   K 3.7 06/20/2015   CL 104 06/20/2015   CO2 26 06/20/2015   Lab Results  Component Value Date   ALT 10 06/23/2014   AST 15 06/23/2014   ALKPHOS 48 06/23/2014   BILITOT 0.3 06/23/2014    Imaging Studies: No results found.

## 2016-01-15 NOTE — Patient Instructions (Signed)
1. Start prednisone 30 mg daily for 7 days, 20 mg daily for 5 days, 10 mg daily for 5 days, 5 mg daily for 5 days then off. 2. Colonoscopy in 3-4 weeks. 3. We will look into insurance concerns regarding Imuran and Apriso.  4. I will get a copy of your most recent labs for review and we will let you know if you need any additional labs regarding your ulcerative colitis.

## 2016-01-15 NOTE — Progress Notes (Signed)
cc'ed to pcp °

## 2016-01-24 DIAGNOSIS — Z0100 Encounter for examination of eyes and vision without abnormal findings: Secondary | ICD-10-CM | POA: Diagnosis not present

## 2016-02-01 ENCOUNTER — Ambulatory Visit (HOSPITAL_COMMUNITY)
Admission: RE | Admit: 2016-02-01 | Discharge: 2016-02-01 | Disposition: A | Discharge status: Home / Self Care | Payer: Medicare HMO | Source: Ambulatory Visit | Attending: Internal Medicine | Admitting: Internal Medicine

## 2016-02-01 ENCOUNTER — Encounter (HOSPITAL_COMMUNITY)
Admission: RE | Disposition: A | Discharge status: Home / Self Care | Payer: Self-pay | Source: Ambulatory Visit | Attending: Internal Medicine

## 2016-02-01 DIAGNOSIS — Z951 Presence of aortocoronary bypass graft: Secondary | ICD-10-CM | POA: Diagnosis not present

## 2016-02-01 DIAGNOSIS — M1990 Unspecified osteoarthritis, unspecified site: Secondary | ICD-10-CM | POA: Diagnosis not present

## 2016-02-01 DIAGNOSIS — K6289 Other specified diseases of anus and rectum: Secondary | ICD-10-CM | POA: Diagnosis not present

## 2016-02-01 DIAGNOSIS — I10 Essential (primary) hypertension: Secondary | ICD-10-CM | POA: Diagnosis not present

## 2016-02-01 DIAGNOSIS — K529 Noninfective gastroenteritis and colitis, unspecified: Secondary | ICD-10-CM | POA: Diagnosis not present

## 2016-02-01 DIAGNOSIS — Z7982 Long term (current) use of aspirin: Secondary | ICD-10-CM | POA: Diagnosis not present

## 2016-02-01 DIAGNOSIS — K523 Indeterminate colitis: Secondary | ICD-10-CM

## 2016-02-01 DIAGNOSIS — K5190 Ulcerative colitis, unspecified, without complications: Secondary | ICD-10-CM | POA: Insufficient documentation

## 2016-02-01 DIAGNOSIS — F419 Anxiety disorder, unspecified: Secondary | ICD-10-CM | POA: Diagnosis not present

## 2016-02-01 DIAGNOSIS — Z79899 Other long term (current) drug therapy: Secondary | ICD-10-CM | POA: Diagnosis not present

## 2016-02-01 DIAGNOSIS — Z87891 Personal history of nicotine dependence: Secondary | ICD-10-CM | POA: Insufficient documentation

## 2016-02-01 DIAGNOSIS — R69 Illness, unspecified: Secondary | ICD-10-CM | POA: Diagnosis not present

## 2016-02-01 DIAGNOSIS — K625 Hemorrhage of anus and rectum: Secondary | ICD-10-CM

## 2016-02-01 DIAGNOSIS — Z1212 Encounter for screening for malignant neoplasm of rectum: Secondary | ICD-10-CM | POA: Diagnosis not present

## 2016-02-01 DIAGNOSIS — K633 Ulcer of intestine: Secondary | ICD-10-CM | POA: Diagnosis not present

## 2016-02-01 DIAGNOSIS — Z1211 Encounter for screening for malignant neoplasm of colon: Secondary | ICD-10-CM | POA: Diagnosis not present

## 2016-02-01 HISTORY — PX: COLONOSCOPY: SHX5424

## 2016-02-01 LAB — CBC WITH DIFFERENTIAL/PLATELET
BASOS ABS: 0 10*3/uL (ref 0.0–0.1)
BASOS PCT: 0 %
Eosinophils Absolute: 0.1 10*3/uL (ref 0.0–0.7)
Eosinophils Relative: 1 %
HEMATOCRIT: 30.9 % — AB (ref 36.0–46.0)
HEMOGLOBIN: 9.5 g/dL — AB (ref 12.0–15.0)
Lymphocytes Relative: 22 %
Lymphs Abs: 1.6 10*3/uL (ref 0.7–4.0)
MCH: 25.3 pg — ABNORMAL LOW (ref 26.0–34.0)
MCHC: 30.7 g/dL (ref 30.0–36.0)
MCV: 82.4 fL (ref 78.0–100.0)
MONOS PCT: 11 %
Monocytes Absolute: 0.8 10*3/uL (ref 0.1–1.0)
NEUTROS ABS: 4.7 10*3/uL (ref 1.7–7.7)
NEUTROS PCT: 66 %
Platelets: 309 10*3/uL (ref 150–400)
RBC: 3.75 MIL/uL — AB (ref 3.87–5.11)
RDW: 16.5 % — ABNORMAL HIGH (ref 11.5–15.5)
WBC: 7.2 10*3/uL (ref 4.0–10.5)

## 2016-02-01 LAB — COMPREHENSIVE METABOLIC PANEL
ALK PHOS: 43 U/L (ref 38–126)
ALT: 15 U/L (ref 14–54)
ANION GAP: 6 (ref 5–15)
AST: 15 U/L (ref 15–41)
Albumin: 3.1 g/dL — ABNORMAL LOW (ref 3.5–5.0)
BUN: 12 mg/dL (ref 6–20)
CALCIUM: 8.9 mg/dL (ref 8.9–10.3)
CO2: 28 mmol/L (ref 22–32)
Chloride: 103 mmol/L (ref 101–111)
Creatinine, Ser: 0.94 mg/dL (ref 0.44–1.00)
GFR calc non Af Amer: 60 mL/min — ABNORMAL LOW (ref >60)
Glucose, Bld: 90 mg/dL (ref 65–99)
Potassium: 3.6 mmol/L (ref 3.5–5.1)
SODIUM: 137 mmol/L (ref 135–145)
TOTAL PROTEIN: 6 g/dL — AB (ref 6.5–8.1)
Total Bilirubin: 0.6 mg/dL (ref 0.3–1.2)

## 2016-02-01 SURGERY — COLONOSCOPY
Anesthesia: Moderate Sedation

## 2016-02-01 MED ORDER — STERILE WATER FOR IRRIGATION IR SOLN
Status: DC | PRN
  Administered 2016-02-01: 11:00:00

## 2016-02-01 MED ORDER — ONDANSETRON HCL 4 MG/2ML IJ SOLN
INTRAMUSCULAR | Status: AC
  Filled 2016-02-01: qty 2

## 2016-02-01 MED ORDER — MEPERIDINE HCL 100 MG/ML IJ SOLN
INTRAMUSCULAR | Status: DC | PRN
  Administered 2016-02-01: 25 mg via INTRAVENOUS
  Administered 2016-02-01: 50 mg via INTRAVENOUS

## 2016-02-01 MED ORDER — SODIUM CHLORIDE 0.9 % IV SOLN
INTRAVENOUS | Status: DC
  Administered 2016-02-01: 1000 mL via INTRAVENOUS

## 2016-02-01 MED ORDER — ONDANSETRON HCL 4 MG/2ML IJ SOLN
INTRAMUSCULAR | Status: DC | PRN
Start: 2016-02-01 — End: 2016-02-01
  Administered 2016-02-01: 4 mg via INTRAVENOUS

## 2016-02-01 MED ORDER — MIDAZOLAM HCL 5 MG/5ML IJ SOLN
INTRAMUSCULAR | Status: DC | PRN
  Administered 2016-02-01: 1 mg via INTRAVENOUS
  Administered 2016-02-01 (×2): 2 mg via INTRAVENOUS

## 2016-02-01 MED ORDER — MIDAZOLAM HCL 5 MG/5ML IJ SOLN
INTRAMUSCULAR | Status: AC
  Filled 2016-02-01: qty 10

## 2016-02-01 MED ORDER — MEPERIDINE HCL 100 MG/ML IJ SOLN
INTRAMUSCULAR | Status: AC
  Filled 2016-02-01: qty 2

## 2016-02-01 NOTE — Discharge Instructions (Addendum)
°  Colonoscopy Discharge Instructions  Read the instructions outlined below and refer to this sheet in the next few weeks. These discharge instructions provide you with general information on caring for yourself after you leave the hospital. Your doctor may also give you specific instructions. While your treatment has been planned according to the most current medical practices available, unavoidable complications occasionally occur. If you have any problems or questions after discharge, call Dr. [name] at [phone]. ACTIVITY  You may resume your regular activity, but move at a slower pace for the next 24 hours.   Take frequent rest periods for the next 24 hours.   Walking will help get rid of the air and reduce the bloated feeling in your belly (abdomen).   No driving for 24 hours (because of the medicine (anesthesia) used during the test).    Do not sign any important legal documents or operate any machinery for 24 hours (because of the anesthesia used during the test).  NUTRITION  Drink plenty of fluids.   You may resume your normal diet as instructed by your doctor.   Begin with a light meal and progress to your normal diet. Heavy or fried foods are harder to digest and may make you feel sick to your stomach (nauseated).   Avoid alcoholic beverages for 24 hours or as instructed.  MEDICATIONS  You may resume your normal medications unless your doctor tells you otherwise.  WHAT YOU CAN EXPECT TODAY  Some feelings of bloating in the abdomen.   Passage of more gas than usual.   Spotting of blood in your stool or on the toilet paper.  IF YOU HAD POLYPS REMOVED DURING THE COLONOSCOPY:  No aspirin products for 7 days or as instructed.   No alcohol for 7 days or as instructed.   Eat a soft diet for the next 24 hours.  FINDING OUT THE RESULTS OF YOUR TEST Not all test results are available during your visit. If your test results are not back during the visit, make an appointment  with your caregiver to find out the results. Do not assume everything is normal if you have not heard from your caregiver or the medical facility. It is important for you to follow up on all of your test results.  SEEK IMMEDIATE MEDICAL ATTENTION IF:  You have more than a spotting of blood in your stool.   Your belly is swollen (abdominal distention).   You are nauseated or vomiting.   You have a temperature over 101.   You have abdominal pain or discomfort that is severe or gets worse throughout the day.     Continue prednisone at a dose of 5 mg a day for the time being  CBC and CMP today  Further recommendations to follow pending review of pathology report

## 2016-02-01 NOTE — Interval H&P Note (Signed)
History and Physical Interval Note:  02/01/2016 10:45 AM  Bonnie Powers  has presented today for surgery, with the diagnosis of UC with rectal bleeding  The various methods of treatment have been discussed with the patient and family. After consideration of risks, benefits and other options for treatment, the patient has consented to  Procedure(s) with comments: COLONOSCOPY (N/A) - 1015 as a surgical intervention .  The patient's history has been reviewed, patient examined, no change in status, stable for surgery.  I have reviewed the patient's chart and labs.  Questions were answered to the patient's satisfaction.     Manus Rudd

## 2016-02-01 NOTE — Op Note (Signed)
[facility] Patient Name: Bonnie Powers Procedure Date: 02/01/2016 10:38 AM MRN: [id number] Date of Birth: June 30, 1945 Attending MD: Norvel Richards , MD CSN: [id number] Age: 70 Admit Type: Outpatient Procedure:                Ileo-colonoscopy with multiple biopsies. Indications:              High risk colon cancer surveillance: Inflammatory                            bowel disease Providers:                Norvel Richards, MD, Otis Peak B. Gwenlyn Perking RN, RN,                            Isabella Stalling, Technician Referring MD:              Medicines:                Midazolam 5 mg IV, Meperidine 75 mg IV, Ondansetron                            4 mg IV Complications:            No immediate complications. Estimated Blood Loss:     Estimated blood loss was minimal. Procedure:                Pre-Anesthesia Assessment:                           - Prior to the procedure, a History and Physical                            was performed, and patient medications and                            allergies were reviewed. The patient's tolerance of                            previous anesthesia was also reviewed. The risks                            and benefits of the procedure and the sedation                            options and risks were discussed with the patient.                            All questions were answered, and informed consent                            was obtained. Prior Anticoagulants: The patient has                            taken no previous anticoagulant or antiplatelet  agents. ASA Grade Assessment: III - A patient with                            severe systemic disease. After reviewing the risks                            and benefits, the patient was deemed in                            satisfactory condition to undergo the procedure.                           After obtaining informed consent, the colonoscope   was passed under direct vision. Throughout the                            procedure, the patient's blood pressure, pulse, and                            oxygen saturations were monitored continuously. The                            EC-[id number] ([id number]) scope was introduced through                            the anus and advanced to the 5 cm into the ileum.                            The terminal ileum, ileocecal valve, appendiceal                            orifice, and rectum were photographed. The                            colonoscopy was performed without difficulty. The                            patient tolerated the procedure well. The quality                            of the bowel preparation was adequate. The entire                            colon was well visualized. Scope In: 10:53:56 AM Scope Out: 11:10:54 AM Scope Withdrawal Time: 0 hours 14 minutes 33 seconds  Total Procedure Duration: 0 hours 16 minutes 58 seconds  Findings:      The perianal and digital rectal examinations were normal.      Ulcerated mucosa were present in the colon and rectum.Marland Kitchen Extensive       "geographic" ulceration from the rectosigmoid all the way to the       ileocecal valve. "[place]" deformity of the ileocecal valve. The       distal 5 cm of terminal ileum appeared normal.This was biopsied with a  cold forceps for histology. There were areas of "skipped" mucosa which       appeared relatively normal throughout the colon. There was also relative       sparing of the rectum. However, The rectal mucosa was not entirely       spared as there were scattered areas of ulceration all away down into       the distal rectum. there was slight nodularity with central ulceration       and an area of the ascending segment which was biopsied separately.       Extensive segmental biopsies of the colon from ecal valve down through       the rectum were taken. I did not see any evidence of adenoma or        carcinoma.      The exam was otherwise without abnormality on direct and retroflexion       views. Impression:               - Mucosal ulceration. Biopsied. Endoscopic picture                            looks more now like Crohn's colitis rather than                            ulcerative colitis. Patient came off of her                            multiple medications for IBD on her earlier in the                            year which likely explains the flare.                           - The examination was otherwise normal on direct                            and retroflexion views. Moderate Sedation:      Moderate (conscious) sedation was administered by the endoscopy nurse       and supervised by the endoscopist. The following parameters were       monitored: oxygen saturation, heart rate, blood pressure, respiratory       rate, EKG, adequacy of pulmonary ventilation, and response to care.       Total physician intraservice time was 22 minutes. Recommendation:           - Patient has a contact number available for                            emergencies. The signs and symptoms of potential                            delayed complications were discussed with the                            patient. Return to normal activities tomorrow.  Written discharge instructions were provided to the                            patient.                           - Resume previous diet.                           - Continue present medications. currently on                            prednisone 5 mg daily?"will continue at this dosage                            for the time being. CBC, CMP today. I note                            creatinine was slightly elevated earlier in the                            year.                           - Repeat colonoscopy date to be determined after                            pending pathology results are reviewed for                             surveillance based on pathology results.                           - Return to GI office in 12 weeks. Procedure Code(s):        --- Professional ---                           [phone], Colonoscopy, flexible; with biopsy, single                            or multiple                           99152, Moderate sedation services provided by the                            same physician or other qualified health care                            professional performing the diagnostic or                            therapeutic service that the sedation supports,                            requiring the presence of  an independent trained                            observer to assist in the monitoring of the                            patient's level of consciousness and physiological                            status; initial 15 minutes of intraservice time,                            patient age 71 years or older Diagnosis Code(s):        --- Professional ---                           K52.3, Indeterminate colitis                           K63.3, Ulcer of intestine CPT copyright 2016 American Medical Association. All rights reserved. The codes documented in this report are preliminary and upon coder review may  be revised to meet current compliance requirements. Cristopher Estimable. Ambika Zettlemoyer, MD Norvel Richards, MD 02/01/2016 11:23:43 AM This report has been signed electronically. Number of Addenda: 0

## 2016-02-01 NOTE — H&P (View-Only) (Signed)
Primary Care Physician: Renee Rival, NP  Primary Gastroenterologist:  Garfield Cornea, MD   Chief Complaint  Patient presents with  . Ulcerative Colitis    cramping,diarrhea (started 3 weeks ago)    HPI: Bonnie Powers is a 70 y.o. female here for follow-up of ulcerative colitis. Last seen in July 2016.She is overdue for surveillance colonoscopy. She's had multiple medical issues herself as well as her husband over the last couple of years. She presents today with complaints of UC flare. She came off her medications both Apriso and Imuran 3 months ago. States the price went up and she couldn't afford it. Apriso cost went up from $47 to over $200. She's had a lot of medical expenses due to cataract surgeries, her husband had multiple surgeries. Now on xanax for stress, no more than one per day. Over the past 3 weeks she developed a UC flare. Some brbpr in the stools. Stools are very soft. Abdominal cramping. BM 3-4 times within one hour. Not getting her up at night. Appetite okay. No n/v. No fever.   She tells me she is ready to schedule her colonoscopy. She prefers prednisone for UC flare even though before she told me she had side effects(irritability) that were unwanted. Took too long for Uceris to help her last time.   Current Outpatient Prescriptions  Medication Sig Dispense Refill  . acetaminophen (TYLENOL) 500 MG tablet Take 500-1,000 mg by mouth every 6 (six) hours as needed for mild pain or headache.     . ALPRAZolam (XANAX) 0.5 MG tablet Take 0.5 mg by mouth daily as needed for anxiety.    Marland Kitchen aspirin EC 81 MG tablet Take 1 tablet (81 mg total) by mouth daily. 90 tablet 3  . Calcium Carb-Cholecalciferol 500-600 MG-UNIT TABS Take 1 tablet by mouth daily.    . cholecalciferol (VITAMIN D) 1000 UNITS tablet Take 1,000 Units by mouth daily.    . hydrochlorothiazide (MICROZIDE) 12.5 MG capsule Take 12.5 mg by mouth daily.  6  . Multiple Vitamin (MULTIVITAMIN WITH MINERALS)  TABS tablet Take 1 tablet by mouth daily.    . nitroGLYCERIN (NITROSTAT) 0.4 MG SL tablet Place 1 tablet (0.4 mg total) under the tongue every 5 (five) minutes as needed for chest pain. 25 tablet 3  . Probiotic Product (PROBIOTIC FORMULA PO) Take 1 tablet by mouth daily.     No current facility-administered medications for this visit.     Allergies as of 01/15/2016 - Review Complete 01/15/2016  Allergen Reaction Noted  . Statins Other (See Comments) 03/02/2014  . Aspirin  02/06/2011   Past Medical History:  Diagnosis Date  . Anxiety   . Basal cell cancer    left temple  . Depression   . Headache   . Hepatitis 1972   ? unknown type  . HTN (hypertension)   . Hx of cataract surgery 09/11/2015  . Hypercholesteremia   . OA (osteoarthritis)   . Otitis media, chronic   . Ulcerative colitis 2001   Last colonoscopy Dr [name] 12/28/09-> pan-UC, normal TI, maintained on Asacol and Imuran   Past Surgical History:  Procedure Laterality Date  . COLONOSCOPY  12/28/09   Dr. [name] colitis  . CORONARY ARTERY BYPASS GRAFT N/A 02/25/2014   Procedure: CORONARY ARTERY BYPASS GRAFTING (CABG);  Surgeon: Ivin Poot, MD;  Location: [facility];  Service: Open Heart Surgery;  Laterality: N/A;  . EYE SURGERY    . INTRAOPERATIVE TRANSESOPHAGEAL ECHOCARDIOGRAM N/A 02/25/2014  Procedure: INTRAOPERATIVE TRANSESOPHAGEAL ECHOCARDIOGRAM;  Surgeon: Ivin Poot, MD;  Location: [facility];  Service: Open Heart Surgery;  Laterality: N/A;  . LEFT HEART CATHETERIZATION WITH CORONARY ANGIOGRAM N/A 02/23/2014   Procedure: LEFT HEART CATHETERIZATION WITH CORONARY ANGIOGRAM;  Surgeon: Burnell Blanks, MD;  Location: [facility] CATH LAB;  Service: Cardiovascular;  Laterality: N/A;  . SALPINGOOPHORECTOMY     right ovary, both tubes  . TONSILLECTOMY     Family History  Problem Relation Age of Onset  . Skin cancer Father   . Heart failure Mother   . HIV Brother   . Lung cancer Brother    Social History    Social History  . Marital status: Divorced    Spouse name: N/A  . Number of children: 3  . Years of education: N/A   Occupational History  . retired Retired    Conservator, museum/gallery   Social History Main Topics  . Smoking status: Former Smoker    Packs/day: 1.00    Years: 30.00    Types: Cigarettes    Quit date: 02/22/2014  . Smokeless tobacco: Never Used     Comment: trying  . Alcohol use No  . Drug use: No  . Sexual activity: No   Other Topics Concern  . None   Social History Narrative   Lost 1 daughter w/ meningitis    ROS:  General: Negative for anorexia, weight loss, fever, chills, fatigue, weakness. ENT: Negative for hoarseness, difficulty swallowing , nasal congestion. CV: Negative for chest pain, angina, palpitations, dyspnea on exertion, peripheral edema.  Respiratory: Negative for dyspnea at rest, dyspnea on exertion, cough, sputum, wheezing.  GI: See history of present illness. GU:  Negative for dysuria, hematuria, urinary incontinence, urinary frequency, nocturnal urination.  Endo: Negative for unusual weight change.    Physical Examination:   BP 129/73   Pulse 99   Temp 97.9 F (36.6 C) (Oral)   Ht 5' 6"  (1.676 m)   Wt 204 lb 12.8 oz (92.9 kg)   BMI 33.06 kg/m   General: Well-nourished, well-developed in no acute distress.  Eyes: No icterus. Mouth: Oropharyngeal mucosa moist and pink , no lesions erythema or exudate. Lungs: Clear to auscultation bilaterally.  Heart: Regular rate and rhythm, no murmurs rubs or gallops.  Abdomen: Bowel sounds are normal, mild LLQ tender, nondistended, no hepatosplenomegaly or masses, no abdominal bruits or hernia , no rebound or guarding.   Extremities: No lower extremity edema. No clubbing or deformities. Neuro: Alert and oriented x 4   Skin: Warm and dry, no jaundice.   Psych: Alert and cooperative, normal mood and affect.  Labs:  Lab Results  Component Value Date   WBC 6.8 06/20/2015   HGB 12.5 06/20/2015    HCT 39.0 06/20/2015   MCV 87.8 06/20/2015   PLT 283 06/20/2015   Lab Results  Component Value Date   CREATININE 1.03 (H) 06/20/2015   BUN 24 (H) 06/20/2015   NA 138 06/20/2015   K 3.7 06/20/2015   CL 104 06/20/2015   CO2 26 06/20/2015   Lab Results  Component Value Date   ALT 10 06/23/2014   AST 15 06/23/2014   ALKPHOS 48 06/23/2014   BILITOT 0.3 06/23/2014    Imaging Studies: No results found.

## 2016-02-03 ENCOUNTER — Encounter: Payer: Self-pay | Admitting: Internal Medicine

## 2016-02-05 ENCOUNTER — Telehealth: Payer: Self-pay

## 2016-02-05 ENCOUNTER — Telehealth: Payer: Self-pay | Admitting: Internal Medicine

## 2016-02-05 ENCOUNTER — Encounter (HOSPITAL_COMMUNITY): Payer: Self-pay | Admitting: Internal Medicine

## 2016-02-05 ENCOUNTER — Encounter: Payer: Self-pay | Admitting: Internal Medicine

## 2016-02-05 NOTE — Telephone Encounter (Signed)
APPT MADE AND LETTER SENT  °

## 2016-02-05 NOTE — Telephone Encounter (Signed)
Call-in prescription prednisone 5 mg daily-dispense 30 with 3 refills

## 2016-02-05 NOTE — Telephone Encounter (Signed)
Pt told RMR she was on low dose prednisone and he instructed her to stay on it. (see bx results) I tried to call pt- NA-LMOM

## 2016-02-05 NOTE — Telephone Encounter (Signed)
I spoke with the pt- she came by this morning and picked up apriso samples, I advised her to call me prior to running out of samples to see if we have more in. She said the pharmacy told her the imuran needed a PA, I have not gotten anything recently from the pharmacy, I will call [facility] in [place] to see what is needed for her to get it.   Pt said she only has 1-2 more days left of the 37m prednisone but she has about 12 days left of the 159mprednisone. Pt wants to know if she should take the 1043mhen she runs out or are we going to call in something else for her. She is still having the same problems as before.

## 2016-02-05 NOTE — Telephone Encounter (Signed)
Letter mailed to the pt. 

## 2016-02-05 NOTE — Telephone Encounter (Signed)
Per RMR- Send letter to patient.  Send copy of letter with path to referring provider and PCP.   Needs ov 1/18

## 2016-02-05 NOTE — Telephone Encounter (Signed)
Patient called about samples and is aware that we have some available for her to pick up. She also asked about the steroids. I told her that I would have to get the nurse to address that. Please call [phone]

## 2016-02-06 MED ORDER — PREDNISONE 5 MG PO TABS: 5.0000 mg | ORAL_TABLET | Freq: Every day | ORAL | 3 refills | Status: DC

## 2016-02-06 NOTE — Addendum Note (Signed)
Addended by: Claudina Lick on: 02/06/2016 09:22 AM   Modules accepted: Orders

## 2016-02-06 NOTE — Telephone Encounter (Signed)
Pt is aware. PA sent in for imuran. rx sent in for prednisone.

## 2016-02-06 NOTE — Telephone Encounter (Signed)
PA for Imuran has been approved. I called [facility] in [place] and they still have the rx on file for the pt. I informed her that the pt needed it and that it has been approved by the insurance. I have faxed a copy of the approval to them at [phone]. Pt is aware.

## 2016-02-14 ENCOUNTER — Telehealth: Payer: Self-pay | Admitting: Internal Medicine

## 2016-02-14 NOTE — Telephone Encounter (Signed)
Pt called to say that she will need more samples of Spriso and she has enough until Sunday.

## 2016-02-15 NOTE — Telephone Encounter (Signed)
Tried to call pt- NA- LMOM that I have left #9 boxes of Apriso at the front desk. Informed her that our office closes at 12 noon tomorrow.

## 2016-02-26 NOTE — Progress Notes (Signed)
Noted. See subsequent telephone notes.

## 2016-03-04 ENCOUNTER — Telehealth: Payer: Self-pay | Admitting: Internal Medicine

## 2016-03-04 NOTE — Telephone Encounter (Signed)
PULL SAMPLES OF APRISO WHEN WE GET SOME IN FOR THE PATIENT, SHE WILL BE OUT IN A FEW DAYS

## 2016-03-04 NOTE — Telephone Encounter (Signed)
I have sent an email to the drug rep to see if we can get more samples.

## 2016-03-04 NOTE — Telephone Encounter (Deleted)
PLEASE PULL SAMPLES OF APRISO FOR THE PATIENT WHEN WE GET SOME.  SHE WILL BE OUT IN A FEW DAYS

## 2016-03-05 NOTE — Telephone Encounter (Signed)
Drug rep Christy sent me an email. She has forwarded a message to the Apriso rep to get him to come by and bring [place] samples. Routing to [place] for [employer]

## 2016-03-12 NOTE — Telephone Encounter (Signed)
Drug rep brought apriso samples. I have left #8 boxes at the front desk for her to pick up. I called and left her a voicemail that her samples were ready.

## 2016-03-25 ENCOUNTER — Telehealth: Payer: Self-pay | Admitting: Internal Medicine

## 2016-03-25 NOTE — Telephone Encounter (Signed)
#  6 bottles of apriso are at the front desk.

## 2016-03-25 NOTE — Telephone Encounter (Signed)
Pt called to see if she could get samples of Apriso.

## 2016-04-09 ENCOUNTER — Telehealth: Payer: Self-pay | Admitting: Internal Medicine

## 2016-04-09 NOTE — Telephone Encounter (Signed)
Pt would like to get more samples of Aprizo. Please call or LMOM at [phone]

## 2016-04-10 DIAGNOSIS — E559 Vitamin D deficiency, unspecified: Secondary | ICD-10-CM | POA: Diagnosis not present

## 2016-04-10 DIAGNOSIS — D649 Anemia, unspecified: Secondary | ICD-10-CM | POA: Diagnosis not present

## 2016-04-10 DIAGNOSIS — E782 Mixed hyperlipidemia: Secondary | ICD-10-CM | POA: Diagnosis not present

## 2016-04-10 DIAGNOSIS — Z79899 Other long term (current) drug therapy: Secondary | ICD-10-CM | POA: Diagnosis not present

## 2016-04-10 DIAGNOSIS — R739 Hyperglycemia, unspecified: Secondary | ICD-10-CM | POA: Diagnosis not present

## 2016-04-11 NOTE — Telephone Encounter (Signed)
#  4 boxes of apriso are at the front desk for pt to pick up. Pt is aware.

## 2016-04-17 DIAGNOSIS — E782 Mixed hyperlipidemia: Secondary | ICD-10-CM | POA: Diagnosis not present

## 2016-04-17 DIAGNOSIS — E559 Vitamin D deficiency, unspecified: Secondary | ICD-10-CM | POA: Diagnosis not present

## 2016-04-17 DIAGNOSIS — D649 Anemia, unspecified: Secondary | ICD-10-CM | POA: Diagnosis not present

## 2016-04-17 DIAGNOSIS — M8589 Other specified disorders of bone density and structure, multiple sites: Secondary | ICD-10-CM | POA: Diagnosis not present

## 2016-04-17 DIAGNOSIS — R69 Illness, unspecified: Secondary | ICD-10-CM | POA: Diagnosis not present

## 2016-04-17 DIAGNOSIS — I10 Essential (primary) hypertension: Secondary | ICD-10-CM | POA: Diagnosis not present

## 2016-04-17 DIAGNOSIS — Z23 Encounter for immunization: Secondary | ICD-10-CM | POA: Diagnosis not present

## 2016-04-17 DIAGNOSIS — R739 Hyperglycemia, unspecified: Secondary | ICD-10-CM | POA: Diagnosis not present

## 2016-04-17 DIAGNOSIS — Z79899 Other long term (current) drug therapy: Secondary | ICD-10-CM | POA: Diagnosis not present

## 2016-04-29 ENCOUNTER — Ambulatory Visit: Payer: Medicare HMO | Admitting: Gastroenterology

## 2016-05-02 ENCOUNTER — Encounter: Payer: Self-pay | Admitting: Internal Medicine

## 2016-05-02 ENCOUNTER — Telehealth: Payer: Self-pay | Admitting: Internal Medicine

## 2016-05-02 NOTE — Telephone Encounter (Signed)
Pt called to say that her husband was coming home from the hospital soon and would call us to reschedule her appointment. She was asking about getting more samples of Apriso. Please advise.

## 2016-05-02 NOTE — Telephone Encounter (Signed)
Please let her know that we are out of apriso. I dont know when the drug rep will be back.

## 2016-05-07 ENCOUNTER — Telehealth: Payer: Self-pay | Admitting: Internal Medicine

## 2016-05-07 MED ORDER — MESALAMINE ER 0.375 G PO CP24: 1.5000 g | ORAL_CAPSULE | Freq: Every day | ORAL | 3 refills | Status: DC

## 2016-05-07 NOTE — Addendum Note (Signed)
Addended by: Annitta Needs on: 05/07/2016 12:22 PM   Modules accepted: Orders

## 2016-05-07 NOTE — Telephone Encounter (Signed)
Routing to the refill box. 

## 2016-05-07 NOTE — Telephone Encounter (Signed)
Patient would like a prescription of apriso called into [facility] in [place]

## 2016-05-09 NOTE — Telephone Encounter (Signed)
rx was sent to the wrong pharmacy. I have called in the rx to [facility].

## 2016-05-20 ENCOUNTER — Other Ambulatory Visit: Payer: Self-pay | Admitting: Gastroenterology

## 2016-06-03 DIAGNOSIS — J189 Pneumonia, unspecified organism: Secondary | ICD-10-CM | POA: Diagnosis not present

## 2016-07-31 ENCOUNTER — Other Ambulatory Visit (HOSPITAL_COMMUNITY): Payer: Self-pay | Admitting: Nurse Practitioner

## 2016-07-31 DIAGNOSIS — Z1231 Encounter for screening mammogram for malignant neoplasm of breast: Secondary | ICD-10-CM

## 2016-08-08 DIAGNOSIS — E782 Mixed hyperlipidemia: Secondary | ICD-10-CM | POA: Diagnosis not present

## 2016-08-08 DIAGNOSIS — E559 Vitamin D deficiency, unspecified: Secondary | ICD-10-CM | POA: Diagnosis not present

## 2016-08-08 DIAGNOSIS — Z79899 Other long term (current) drug therapy: Secondary | ICD-10-CM | POA: Diagnosis not present

## 2016-08-08 DIAGNOSIS — R739 Hyperglycemia, unspecified: Secondary | ICD-10-CM | POA: Diagnosis not present

## 2016-08-08 DIAGNOSIS — D649 Anemia, unspecified: Secondary | ICD-10-CM | POA: Diagnosis not present

## 2016-08-13 DIAGNOSIS — M8589 Other specified disorders of bone density and structure, multiple sites: Secondary | ICD-10-CM | POA: Diagnosis not present

## 2016-08-13 DIAGNOSIS — D649 Anemia, unspecified: Secondary | ICD-10-CM | POA: Diagnosis not present

## 2016-08-13 DIAGNOSIS — E782 Mixed hyperlipidemia: Secondary | ICD-10-CM | POA: Diagnosis not present

## 2016-08-13 DIAGNOSIS — E559 Vitamin D deficiency, unspecified: Secondary | ICD-10-CM | POA: Diagnosis not present

## 2016-08-13 DIAGNOSIS — R69 Illness, unspecified: Secondary | ICD-10-CM | POA: Diagnosis not present

## 2016-08-13 DIAGNOSIS — R739 Hyperglycemia, unspecified: Secondary | ICD-10-CM | POA: Diagnosis not present

## 2016-08-13 DIAGNOSIS — I10 Essential (primary) hypertension: Secondary | ICD-10-CM | POA: Diagnosis not present

## 2016-08-13 DIAGNOSIS — Z79899 Other long term (current) drug therapy: Secondary | ICD-10-CM | POA: Diagnosis not present

## 2016-09-09 ENCOUNTER — Ambulatory Visit (HOSPITAL_COMMUNITY)
Admission: RE | Admit: 2016-09-09 | Discharge: 2016-09-09 | Disposition: A | Discharge status: Home / Self Care | Payer: Medicare HMO | Source: Ambulatory Visit | Attending: Nurse Practitioner | Admitting: Nurse Practitioner

## 2016-09-09 DIAGNOSIS — Z1231 Encounter for screening mammogram for malignant neoplasm of breast: Secondary | ICD-10-CM

## 2016-10-05 IMAGING — MG MM DIGITAL SCREENING
8 series · 8 of 24 positions shown · non-contrast
Comparison: Previous exam(s).

CLINICAL DATA: Screening.

EXAM:
2D DIGITAL SCREENING BILATERAL MAMMOGRAM WITH CAD AND ADJUNCT TOMO

[L CC]
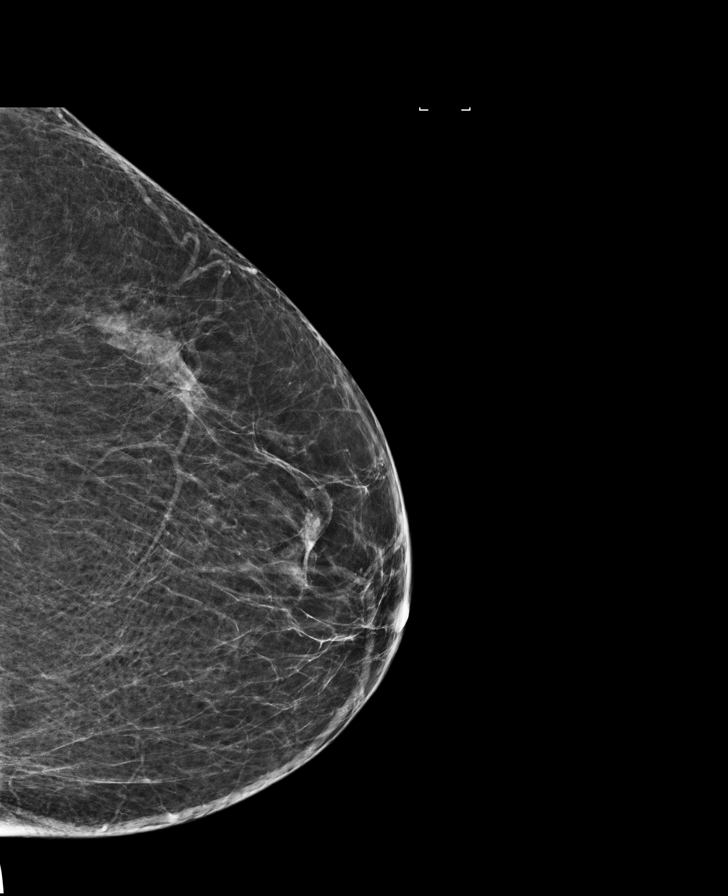

[R CC]
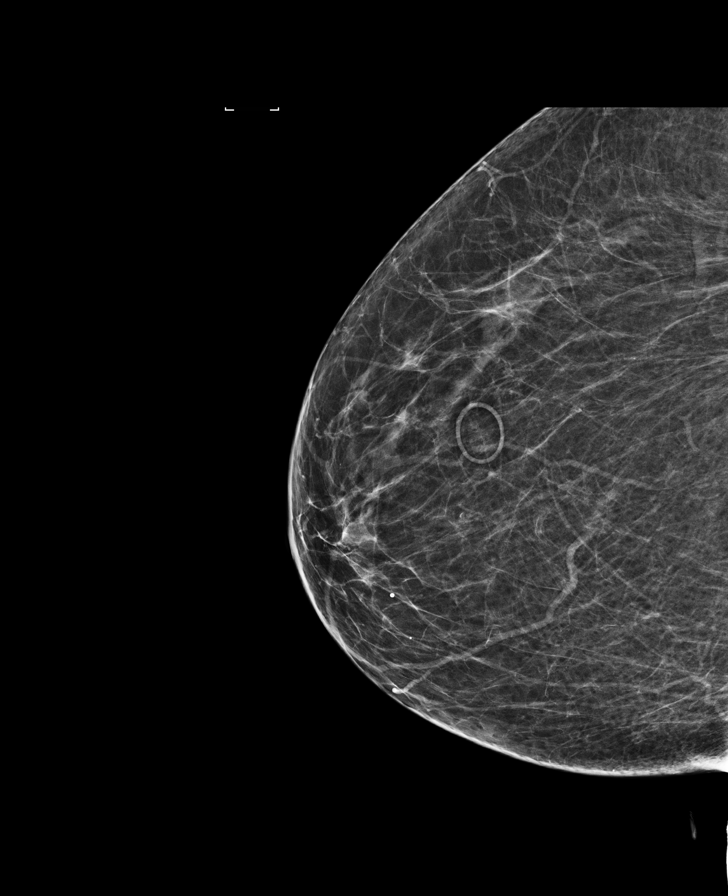

[L MLO]
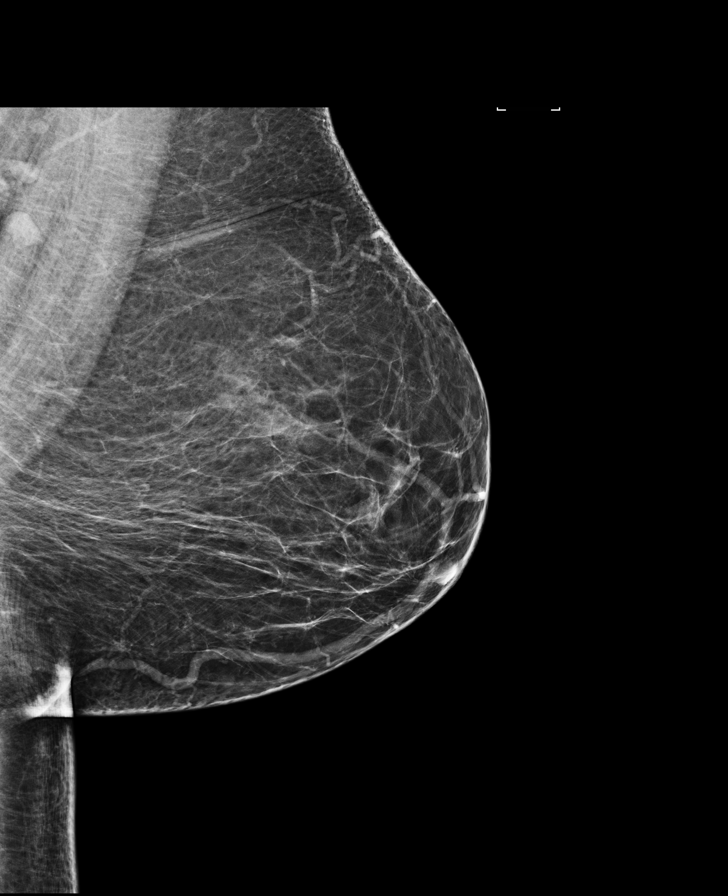

[R MLO]
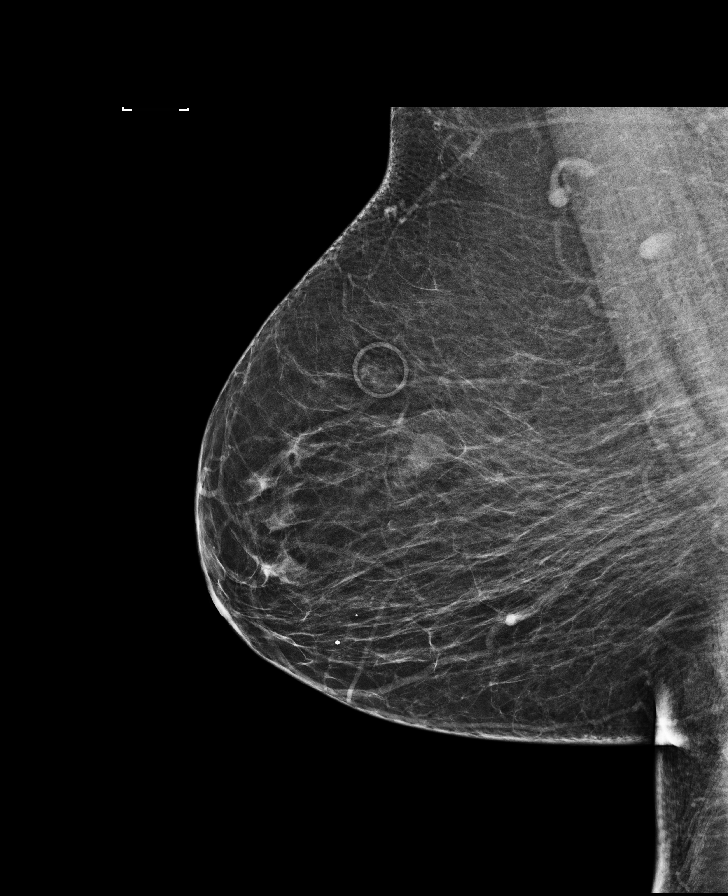

[L CC tomo · tomo slice 28/55.0]
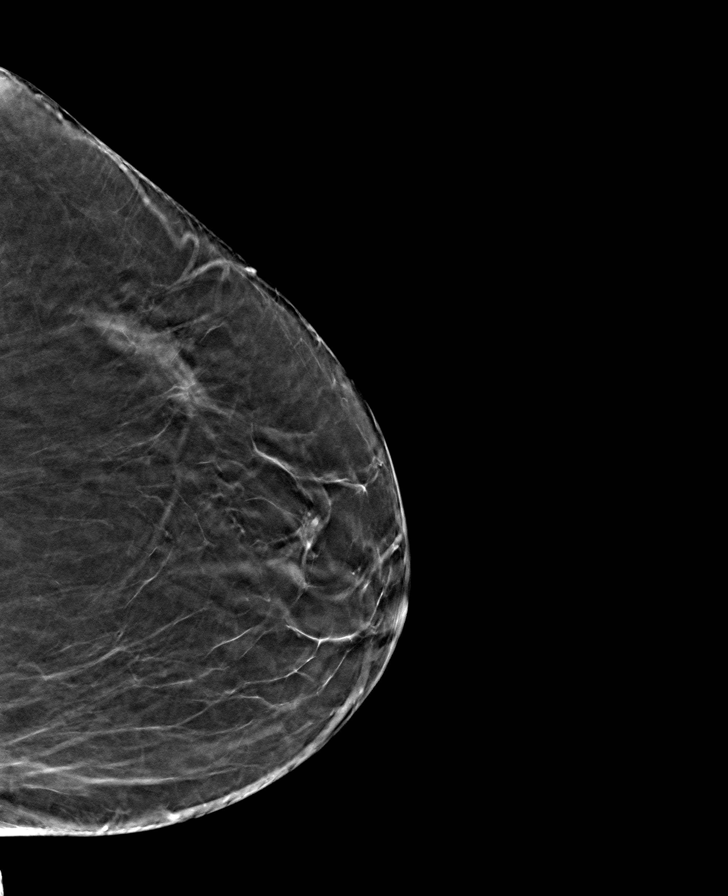

[R MLO tomo · tomo slice 36/71.0]
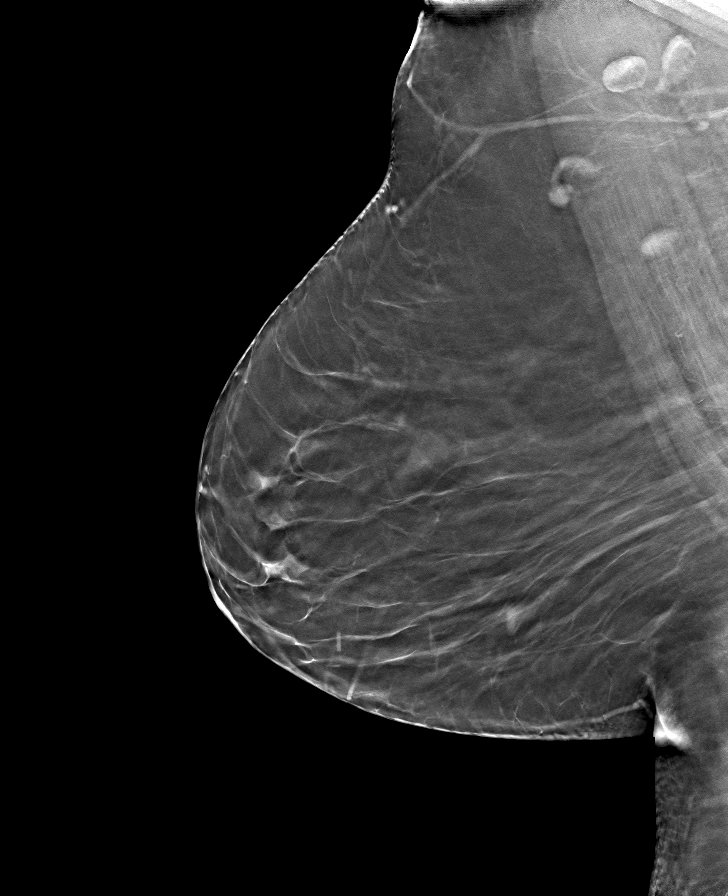

[L MLO tomo · tomo slice 33/66.0]
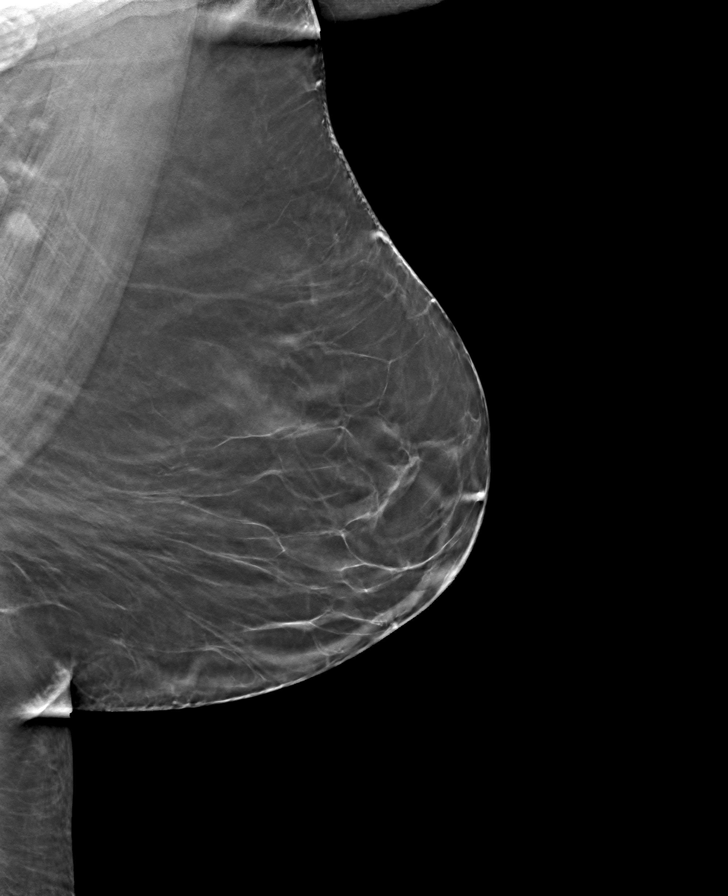

[R CC tomo · tomo slice 31/60.0]
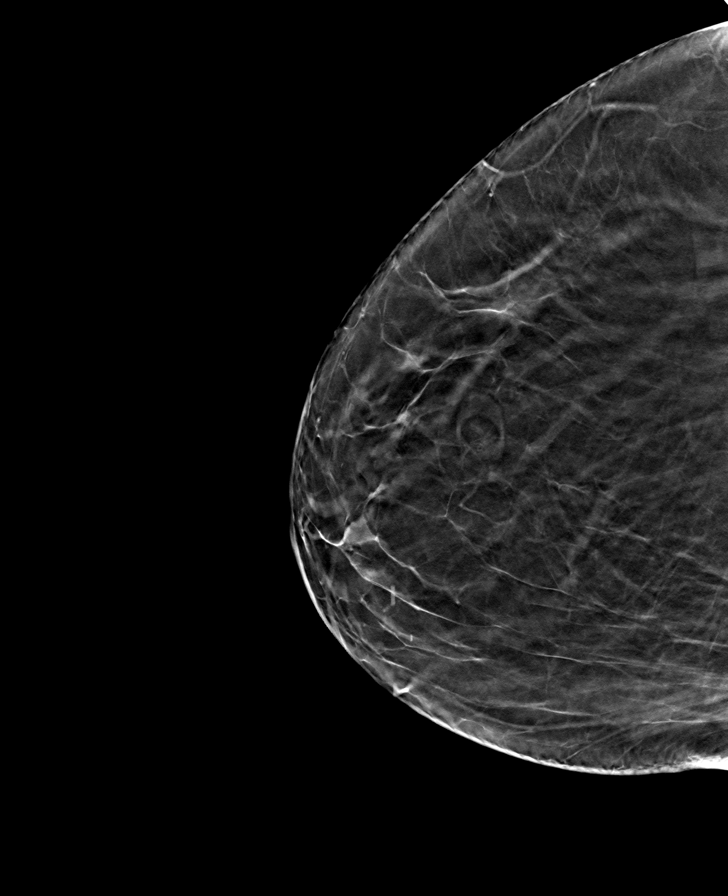

[8 of 24 positions shown; findings below may reference images not displayed]

ACR Breast Density Category b: There are scattered areas of
fibroglandular density.
FINDINGS: There are no findings suspicious for malignancy. Images were
processed with CAD.
IMPRESSION: No mammographic evidence of malignancy. A result letter of this
screening mammogram will be mailed directly to the patient.

RECOMMENDATION:
Screening mammogram in one year. (Code:97-6-RS4)

BI-RADS CATEGORY  1: Negative.

## 2016-10-25 DIAGNOSIS — Z0000 Encounter for general adult medical examination without abnormal findings: Secondary | ICD-10-CM | POA: Diagnosis not present

## 2016-11-25 ENCOUNTER — Ambulatory Visit (INDEPENDENT_AMBULATORY_CARE_PROVIDER_SITE_OTHER): Payer: Medicare HMO | Admitting: Cardiovascular Disease

## 2016-11-25 ENCOUNTER — Encounter: Payer: Self-pay | Admitting: Cardiovascular Disease

## 2016-11-25 VITALS — BP 133/76 | HR 90 | Ht 66.0 in | Wt 210.0 lb

## 2016-11-25 DIAGNOSIS — E785 Hyperlipidemia, unspecified: Secondary | ICD-10-CM

## 2016-11-25 DIAGNOSIS — Z789 Other specified health status: Secondary | ICD-10-CM

## 2016-11-25 DIAGNOSIS — I252 Old myocardial infarction: Secondary | ICD-10-CM | POA: Diagnosis not present

## 2016-11-25 DIAGNOSIS — I25708 Atherosclerosis of coronary artery bypass graft(s), unspecified, with other forms of angina pectoris: Secondary | ICD-10-CM

## 2016-11-25 DIAGNOSIS — I10 Essential (primary) hypertension: Secondary | ICD-10-CM

## 2016-11-25 NOTE — Patient Instructions (Signed)

## 2016-11-25 NOTE — Progress Notes (Signed)
SUBJECTIVE: The patient presents for routine follow-up. She has a history of non-STEMI in November 2015 with multivessel coronary artery disease for which she underwent 3 vessel CABG with LIMA to LAD, SVG to diagonal, and SVG to RCA.  ECG performed in the office today which I ordered and personally interpreted demonstrated sinus rhythm with late R-wave transition.  Her husband had a CVA in January of this year. Since that time she has been very active taking care of all household duties. Once every 6-8 months she has chest pain lasting a few minutes. It is usually musculoskeletal in etiology.  She has been tried on 3 or 4 statins in the past and she is intolerant due to diffuse myalgias.   Soc Hx: Originally from [place], [place]. Numerous grandchildren in [place], [place], and [place]. Has a daughter in [place], [place].  Review of Systems: As per "subjective", otherwise negative.  Allergies  Allergen Reactions  . Statins Other (See Comments)    Muscle aches  . Aspirin     Current Outpatient Prescriptions  Medication Sig Dispense Refill  . acetaminophen (TYLENOL) 500 MG tablet Take 500-1,000 mg by mouth every 6 (six) hours as needed for mild pain or headache.     . ALPRAZolam (XANAX) 0.5 MG tablet Take 0.5 mg by mouth daily as needed for anxiety.    Marland Kitchen aspirin EC 81 MG tablet Take 1 tablet (81 mg total) by mouth daily. 90 tablet 3  . azaTHIOprine (IMURAN) 50 MG tablet TAKE 3 TABLETS (150 MG TOTAL) BY MOUTH DAILY. 270 tablet 3  . Calcium Carb-Cholecalciferol 500-600 MG-UNIT TABS Take 1 tablet by mouth daily.    . cholecalciferol (VITAMIN D) 1000 UNITS tablet Take 1,000 Units by mouth daily.    . hydrochlorothiazide (MICROZIDE) 12.5 MG capsule Take 12.5 mg by mouth daily.  6  . mesalamine (APRISO) 0.375 g 24 hr capsule Take 4 capsules (1.5 g total) by mouth daily. 120 capsule 3  . Multiple Vitamin (MULTIVITAMIN WITH MINERALS) TABS tablet Take 1 tablet by mouth daily.    . nitroGLYCERIN (NITROSTAT)  0.4 MG SL tablet Place 1 tablet (0.4 mg total) under the tongue every 5 (five) minutes as needed for chest pain. 25 tablet 3  . Probiotic Product (PROBIOTIC FORMULA PO) Take 1 tablet by mouth every evening.      No current facility-administered medications for this visit.     Past Medical History:  Diagnosis Date  . Anxiety   . Basal cell cancer    left temple  . Depression   . Headache   . Hepatitis 1972   ? unknown type  . HTN (hypertension)   . Hx of cataract surgery 09/11/2015  . Hypercholesteremia   . OA (osteoarthritis)   . Otitis media, chronic   . Ulcerative colitis 2001   Last colonoscopy Dr [name] 12/28/09-> pan-UC, normal TI, maintained on Asacol and Imuran    Past Surgical History:  Procedure Laterality Date  . COLONOSCOPY  12/28/09   Dr. [name] colitis  . COLONOSCOPY N/A 02/01/2016   Procedure: COLONOSCOPY;  Surgeon: Daneil Dolin, MD;  [facility];  Service: Endoscopy;  Laterality: N/A;  1015  . CORONARY ARTERY BYPASS GRAFT N/A 02/25/2014   Procedure: CORONARY ARTERY BYPASS GRAFTING (CABG);  Surgeon: Ivin Poot, MD;  Location: [place];  Service: Open Heart Surgery;  Laterality: N/A;  . EYE SURGERY    . INTRAOPERATIVE TRANSESOPHAGEAL ECHOCARDIOGRAM N/A 02/25/2014   Procedure: INTRAOPERATIVE TRANSESOPHAGEAL  ECHOCARDIOGRAM;  Surgeon: Ivin Poot, MD;  Location: [place];  Service: Open Heart Surgery;  Laterality: N/A;  . LEFT HEART CATHETERIZATION WITH CORONARY ANGIOGRAM N/A 02/23/2014   Procedure: LEFT HEART CATHETERIZATION WITH CORONARY ANGIOGRAM;  Surgeon: Burnell Blanks, MD;  Location[facility];  Service: Cardiovascular;  Laterality: N/A;  . SALPINGOOPHORECTOMY     right ovary, both tubes  . TONSILLECTOMY      Social History   Social History  . Marital status: Divorced    Spouse name: N/A  . Number of children: 3  . Years of education: N/A   Occupational History  . retired Retired    Conservator, museum/gallery   Social History  Main Topics  . Smoking status: Former Smoker    Packs/day: 1.00    Years: 30.00    Types: Cigarettes    Quit date: 02/22/2014  . Smokeless tobacco: Never Used     Comment: trying  . Alcohol use No  . Drug use: No  . Sexual activity: No   Other Topics Concern  . Not on file   Social History Narrative   Lost 1 daughter w/ meningitis     Vitals:   11/25/16 1311  BP: 133/76  Pulse: 90  SpO2: 96%  Weight: 210 lb (95.3 kg)  Height: 5' 6"  (1.676 m)    Wt Readings from Last 3 Encounters:  11/25/16 210 lb (95.3 kg)  02/01/16 204 lb (92.5 kg)  01/15/16 204 lb 12.8 oz (92.9 kg)     PHYSICAL EXAM General: NAD HEENT: Normal. Neck: No JVD, no thyromegaly. Lungs: Clear to auscultation bilaterally with normal respiratory effort. CV: Nondisplaced PMI.  Regular rate and rhythm, normal S1/S2, no R1/R9, soft systolic murmur over RUSB. No pretibial or periankle edema.  No carotid bruit.   Abdomen: Soft, nontender, no distention.  Neurologic: Alert and oriented.  Psych: Normal affect. Skin: Normal. Musculoskeletal: No gross deformities.    ECG: Most recent ECG reviewed.   Labs: Lab Results  Component Value Date/Time   K 3.6 02/01/2016 11:30 AM   BUN 12 02/01/2016 11:30 AM   CREATININE 0.94 02/01/2016 11:30 AM   CREATININE 0.74 09/30/2012 09:10 AM   ALT 15 02/01/2016 11:30 AM   TSH 2.80 03/18/2014 02:17 PM   HGB 9.5 (L) 02/01/2016 11:30 AM     Lipids: Lab Results  Component Value Date/Time   LDLCALC 119 (H) 06/23/2014 09:52 AM   CHOL 185 06/23/2014 09:52 AM   TRIG 78.0 06/23/2014 09:52 AM   HDL 50.10 06/23/2014 09:52 AM       ASSESSMENT AND PLAN:  1. CAD with 3-vessel CABG: Symptomatically stable. Continue aspirin. Unclear why she is not on a beta blocker. She has been tried on 3 or 4 statins in the past and she is intolerant due to diffuse myalgias.  2. Essential HTN: Controlled on hydrochlorothiazide. No changes.  3. Hyperlipidemia: She has been tried on 3  or 4 statins in the past and she is intolerant due to diffuse myalgias.      Disposition: Follow up 1 yr   Kate Sable, M.D., F.A.C.C.

## 2017-03-11 ENCOUNTER — Ambulatory Visit: Payer: Medicare HMO | Admitting: Internal Medicine

## 2017-03-12 ENCOUNTER — Telehealth: Payer: Self-pay | Admitting: Internal Medicine

## 2017-03-12 DIAGNOSIS — K51011 Ulcerative (chronic) pancolitis with rectal bleeding: Secondary | ICD-10-CM

## 2017-03-12 NOTE — Telephone Encounter (Signed)
Patient came to front window saying she has new insurance and new pharmacy and needed 2 refills on Apriso Er 0.375g and Azathioprine 50 mg. She is now using [facility] on [address]. I made a copy of her new insurance card ([employer])

## 2017-03-12 NOTE — Telephone Encounter (Signed)
Refill request. Pt would like a refill of Apriso Er 0.375g and Azathioprine 50m. [facility] [place] [facility] last ov was 01/25/16 and pts upcoming appointment is 04/29/17 with RMR.

## 2017-03-17 MED ORDER — MESALAMINE ER 0.375 G PO CP24: 1.5000 g | ORAL_CAPSULE | Freq: Every day | ORAL | 3 refills | Status: DC

## 2017-03-17 MED ORDER — AZATHIOPRINE 50 MG PO TABS: ORAL_TABLET | ORAL | 3 refills | Status: DC

## 2017-03-17 NOTE — Telephone Encounter (Signed)
Rx sent to pharmacy per patient request

## 2017-03-17 NOTE — Addendum Note (Signed)
Addended by: Gordy Levan, Draydon Clairmont A on: 03/17/2017 10:40 AM   Modules accepted: Orders

## 2017-04-29 ENCOUNTER — Ambulatory Visit: Payer: Self-pay | Admitting: Internal Medicine

## 2017-06-03 ENCOUNTER — Ambulatory Visit: Payer: Medicare HMO | Admitting: Internal Medicine

## 2017-06-03 ENCOUNTER — Other Ambulatory Visit: Payer: Self-pay

## 2017-06-03 ENCOUNTER — Encounter: Payer: Self-pay | Admitting: Internal Medicine

## 2017-06-03 VITALS — BP 139/70 | HR 104 | Temp 96.5°F | Ht 66.0 in | Wt 222.2 lb

## 2017-06-03 DIAGNOSIS — K9289 Other specified diseases of the digestive system: Secondary | ICD-10-CM

## 2017-06-03 DIAGNOSIS — K51819 Other ulcerative colitis with unspecified complications: Secondary | ICD-10-CM

## 2017-06-03 DIAGNOSIS — K5100 Ulcerative (chronic) pancolitis without complications: Secondary | ICD-10-CM

## 2017-06-03 NOTE — Progress Notes (Signed)
Primary Care Physician:  Renee Rival, NP Primary Gastroenterologist:  Dr. [name]  Pre-Procedure History & Physical: HPI:  Bonnie Powers is a 72 y.o. female here for follow-up of patchy pan-colitis. Diagnosedtic is back to 2001. 2009 16 colonoscopy demonstrated patchy nonspecific colitis. She's done very well on Imuran and Apriso. Ran out of both last year. Back on Imuran and Apriso 2 months ago and Apriso. Doing very well except for him a lot of flatulence. No diarrhea, abdominal pain or rectal bleeding. She is overdue for follow-up. I note on her labs she's had intermittently elevated creatinine. LFTs were normal last fall.  Past Medical History:  Diagnosis Date  . Anxiety   . Basal cell cancer    left temple  . Depression   . Headache   . Hepatitis 1972   ? unknown type  . HTN (hypertension)   . Hx of cataract surgery 09/11/2015  . Hypercholesteremia   . OA (osteoarthritis)   . Otitis media, chronic   . Ulcerative colitis 2001   Last colonoscopy Dr [name] 12/28/09-> pan-UC, normal TI, maintained on Asacol and Imuran    Past Surgical History:  Procedure Laterality Date  . COLONOSCOPY  12/28/09   Dr. [name] colitis  . COLONOSCOPY N/A 02/01/2016   Procedure: COLONOSCOPY;  Surgeon: Daneil Dolin, MD;  [facility];  Service: Endoscopy;  Laterality: N/A;  1015  . CORONARY ARTERY BYPASS GRAFT N/A 02/25/2014   Procedure: CORONARY ARTERY BYPASS GRAFTING (CABG);  Surgeon: Ivin Poot, MD;  Location: [place];  Service: Open Heart Surgery;  Laterality: N/A;  . EYE SURGERY    . INTRAOPERATIVE TRANSESOPHAGEAL ECHOCARDIOGRAM N/A 02/25/2014   Procedure: INTRAOPERATIVE TRANSESOPHAGEAL ECHOCARDIOGRAM;  Surgeon: Ivin Poot, MD;  Location: [place];  Service: Open Heart Surgery;  Laterality: N/A;  . LEFT HEART CATHETERIZATION WITH CORONARY ANGIOGRAM N/A 02/23/2014   Procedure: LEFT HEART CATHETERIZATION WITH CORONARY ANGIOGRAM;  Surgeon: Burnell Blanks, MD;  Location: [facility] CATH LAB;  Service: Cardiovascular;  Laterality: N/A;  . SALPINGOOPHORECTOMY     right ovary, both tubes  . TONSILLECTOMY      Prior to Admission medications   Medication Sig Start Date End Date Taking? Authorizing Provider  acetaminophen (TYLENOL) 500 MG tablet Take 500-1,000 mg by mouth every 6 (six) hours as needed for mild pain or headache.    Yes [provider]  ALPRAZolam Duanne Moron) 0.5 MG tablet Take 0.5 mg by mouth daily as needed for anxiety.   Yes [provider]  aspirin EC 81 MG tablet Take 1 tablet (81 mg total) by mouth daily. 03/18/14  Yes Burtis Junes, NP  azaTHIOprine (IMURAN) 50 MG tablet TAKE 3 TABLETS (150 MG TOTAL) BY MOUTH DAILY. 03/17/17  Yes Carlis Stable, NP  Calcium Carb-Cholecalciferol 500-600 MG-UNIT TABS Take 1 tablet by mouth daily.   Yes [provider]  cholecalciferol (VITAMIN D) 1000 UNITS tablet Take 1,000 Units by mouth daily.   Yes [provider]  hydrochlorothiazide (MICROZIDE) 12.5 MG capsule Take 12.5 mg by mouth daily. 10/19/15  Yes [provider]  mesalamine (APRISO) 0.375 g 24 hr capsule Take 4 capsules (1.5 g total) by mouth daily. 03/17/17  Yes Carlis Stable, NP  Multiple Vitamin (MULTIVITAMIN WITH MINERALS) TABS tablet Take 1 tablet by mouth daily.   Yes [provider]  nitroGLYCERIN (NITROSTAT) 0.4 MG SL tablet Place 1 tablet (0.4 mg total) under the tongue every 5 (five) minutes as needed  for chest pain. 05/05/15  Yes Darlin Coco, MD  Probiotic Product (PROBIOTIC FORMULA PO) Take 1 tablet by mouth every evening.    Yes [provider]    Allergies as of 06/03/2017 - Review Complete 06/03/2017  Allergen Reaction Noted  . Statins Other (See Comments) 03/02/2014  . Aspirin  02/06/2011    Family History  Problem Relation Age of Onset  . Skin cancer Father   . Heart failure Mother   . HIV Brother   . Lung cancer Brother     Social History    Socioeconomic History  . Marital status: Divorced    Spouse name: Not on file  . Number of children: 3  . Years of education: Not on file  . Highest education level: Not on file  Social Needs  . Financial resource strain: Not on file  . Food insecurity - worry: Not on file  . Food insecurity - inability: Not on file  . Transportation needs - medical: Not on file  . Transportation needs - non-medical: Not on file  Occupational History  . Occupation: retired    Fish farm manager: RETIRED    Comment: [employer]  Tobacco Use  . Smoking status: Former Smoker    Packs/day: 1.00    Years: 30.00    Pack years: 30.00    Types: Cigarettes    Last attempt to quit: 02/22/2014    Years since quitting: 3.2  . Smokeless tobacco: Never Used  . Tobacco comment: trying  Substance and Sexual Activity  . Alcohol use: No    Alcohol/week: 0.0 oz  . Drug use: No  . Sexual activity: No  Other Topics Concern  . Not on file  Social History Narrative   Lost 1 daughter w/ meningitis    Review of Systems: See HPI, otherwise negative ROS  Physical Exam: BP 139/70   Pulse (!) 104   Temp (!) 96.5 F (35.8 C) (Oral)   Ht 5' 6"  (1.676 m)   Wt 222 lb 3.2 oz (100.8 kg)   BMI 35.86 kg/m  General:   Alert,   pleasant and cooperative in NAD Lungs:  Clear throughout to auscultation.   No wheezes, crackles, or rhonchi. No acute distress. Heart:  Regular rate and rhythm; no murmurs, clicks, rubs,  or gallops. Abdomen: Non-distended, normal bowel sounds.  Soft and nontender without appreciable mass or hepatosplenomegaly.  Pulses:  Normal pulses noted. Extremities:  Without clubbing or edema.  Impression:  History of inflammatory bowel disease -  patchy distribution on prior colonoscopy. Clinically, doing well at this time on mesalamine and Imuran. I suspect she may well not need mesalamine moving forward as long as she continues on stable dosing regimen of Imuran. She's had long-standing disease and  therefore is at increased risk for colon cancer. Flatulence is nonspecific.  Recommendations: Stop Apriso after completing current prescription  Continue Imuran (150 mg daily)  Repeat liver function, CBC and BMET in 3 months  Probiotic for gas / bloat - like digestive advantage or Philips colon health  OV in 3 months- will set up a repeat colonoscopy at that time       Notice: This dictation was prepared with Dragon dictation along with smaller phrase technology. Any transcriptional errors that result from this process are unintentional and may not be corrected upon review.

## 2017-06-03 NOTE — Progress Notes (Signed)
c 

## 2017-06-03 NOTE — Patient Instructions (Addendum)
Stop Apriso after completing current prescription  Continue Imuran (150 mg daily)  Repeat liver function, CBC and BMET in 3 months  Probiotic for gas / bloat - like digestive advantage or Philips colon health  OV in 3 months- will set up a repeat colonoscopy at that time

## 2017-07-09 ENCOUNTER — Telehealth: Payer: Self-pay | Admitting: Internal Medicine

## 2017-07-09 NOTE — Telephone Encounter (Signed)
RECALL FOR LIVER FUNCTION, CBC AND BMET

## 2017-07-09 NOTE — Telephone Encounter (Signed)
Noted, orders on file for 08/2017

## 2017-07-11 ENCOUNTER — Other Ambulatory Visit: Payer: Self-pay

## 2017-07-11 DIAGNOSIS — K51819 Other ulcerative colitis with unspecified complications: Secondary | ICD-10-CM

## 2017-07-30 ENCOUNTER — Other Ambulatory Visit (HOSPITAL_COMMUNITY): Payer: Self-pay | Admitting: Nurse Practitioner

## 2017-07-30 DIAGNOSIS — M8589 Other specified disorders of bone density and structure, multiple sites: Secondary | ICD-10-CM

## 2017-08-04 ENCOUNTER — Other Ambulatory Visit (HOSPITAL_COMMUNITY): Payer: Self-pay | Admitting: Nurse Practitioner

## 2017-08-04 DIAGNOSIS — Z1231 Encounter for screening mammogram for malignant neoplasm of breast: Secondary | ICD-10-CM

## 2017-08-08 LAB — CBC WITH DIFFERENTIAL/PLATELET
BASOS PCT: 0.3 %
Basophils Absolute: 23 cells/uL (ref 0–200)
EOS ABS: 30 {cells}/uL (ref 15–500)
EOS PCT: 0.4 %
HCT: 37.5 % (ref 35.0–45.0)
HEMOGLOBIN: 12.4 g/dL (ref 11.7–15.5)
Lymphs Abs: 1771 cells/uL (ref 850–3900)
MCH: 28.1 pg (ref 27.0–33.0)
MCHC: 33.1 g/dL (ref 32.0–36.0)
MCV: 85 fL (ref 80.0–100.0)
MONOS PCT: 8.3 %
MPV: 10.4 fL (ref 7.5–12.5)
NEUTROS ABS: 5145 {cells}/uL (ref 1500–7800)
Neutrophils Relative %: 67.7 %
Platelets: 297 10*3/uL (ref 140–400)
RBC: 4.41 10*6/uL (ref 3.80–5.10)
RDW: 14.7 % (ref 11.0–15.0)
Total Lymphocyte: 23.3 %
WBC mixed population: 631 cells/uL (ref 200–950)
WBC: 7.6 10*3/uL (ref 3.8–10.8)

## 2017-08-08 LAB — BASIC METABOLIC PANEL
BUN / CREAT RATIO: 21 (calc) (ref 6–22)
BUN: 23 mg/dL (ref 7–25)
CO2: 30 mmol/L (ref 20–32)
CREATININE: 1.1 mg/dL — AB (ref 0.60–0.93)
Calcium: 10.1 mg/dL (ref 8.6–10.4)
Chloride: 103 mmol/L (ref 98–110)
GLUCOSE: 90 mg/dL (ref 65–139)
Potassium: 3.7 mmol/L (ref 3.5–5.3)
SODIUM: 137 mmol/L (ref 135–146)

## 2017-08-08 LAB — HEPATIC FUNCTION PANEL
AG Ratio: 1.5 (calc) (ref 1.0–2.5)
ALBUMIN MSPROF: 4.4 g/dL (ref 3.6–5.1)
ALT: 11 U/L (ref 6–29)
AST: 15 U/L (ref 10–35)
Alkaline phosphatase (APISO): 50 U/L (ref 33–130)
BILIRUBIN DIRECT: 0.1 mg/dL (ref 0.0–0.2)
BILIRUBIN TOTAL: 0.5 mg/dL (ref 0.2–1.2)
GLOBULIN: 2.9 g/dL (ref 1.9–3.7)
Indirect Bilirubin: 0.4 mg/dL (calc) (ref 0.2–1.2)
Total Protein: 7.3 g/dL (ref 6.1–8.1)

## 2017-08-18 ENCOUNTER — Other Ambulatory Visit: Payer: Self-pay

## 2017-08-18 DIAGNOSIS — R748 Abnormal levels of other serum enzymes: Secondary | ICD-10-CM

## 2017-08-21 ENCOUNTER — Other Ambulatory Visit (HOSPITAL_COMMUNITY): Payer: Medicare HMO

## 2017-08-27 ENCOUNTER — Encounter: Payer: Self-pay | Admitting: Gastroenterology

## 2017-08-27 ENCOUNTER — Ambulatory Visit: Payer: Medicare HMO | Admitting: Gastroenterology

## 2017-08-27 ENCOUNTER — Telehealth: Payer: Self-pay | Admitting: *Deleted

## 2017-08-27 VITALS — BP 137/68 | HR 85 | Temp 97.0°F | Ht 66.0 in | Wt 220.4 lb

## 2017-08-27 DIAGNOSIS — K5190 Ulcerative colitis, unspecified, without complications: Secondary | ICD-10-CM | POA: Diagnosis not present

## 2017-08-27 NOTE — Progress Notes (Signed)
Primary Care Physician: Renee Rival, NP  Primary Gastroenterologist:  Garfield Cornea, MD   Chief Complaint  Patient presents with  . Ulcerative Colitis    f/u. Doing okay    HPI: Bonnie Powers is a 72 y.o. female here for follow up of UC.  Diagnosed back in 2001.  Last colonoscopy in 2017 somewhat patchy appearance of colitis throughout the colon, biopsies with active pancolitis, terminal ileum appeared normal.  Endoscopically appeared more like Crohn's rather than UC although no plans to change the diagnosis per Dr. [name]. She has been doing well. Single day of diarrhea in the past 18 months. This happened the other week. No melena, brbpr. BM most days, 1 per day. No abdominal pain. No heartburn, vomiting, dysphagia. +flatulence slightly better on probiotics. Ready to schedule TCS.     Current Outpatient Medications  Medication Sig Dispense Refill  . acetaminophen (TYLENOL) 500 MG tablet Take 500-1,000 mg by mouth every 6 (six) hours as needed for mild pain or headache.     . ALPRAZolam (XANAX) 0.5 MG tablet Take 0.5 mg by mouth daily as needed for anxiety.    Marland Kitchen azaTHIOprine (IMURAN) 50 MG tablet TAKE 3 TABLETS (150 MG TOTAL) BY MOUTH DAILY. 270 tablet 3  . Calcium Carb-Cholecalciferol 500-600 MG-UNIT TABS Take 1 tablet by mouth daily.    . cholecalciferol (VITAMIN D) 1000 UNITS tablet Take 1,000 Units by mouth daily.    . hydrochlorothiazide (MICROZIDE) 12.5 MG capsule Take 12.5 mg by mouth daily.  6  . Multiple Vitamin (MULTIVITAMIN WITH MINERALS) TABS tablet Take 1 tablet by mouth daily.    . nitroGLYCERIN (NITROSTAT) 0.4 MG SL tablet Place 1 tablet (0.4 mg total) under the tongue every 5 (five) minutes as needed for chest pain. 25 tablet 3  . Probiotic Product (PROBIOTIC FORMULA PO) Take 1 tablet by mouth every evening.      No current facility-administered medications for this visit.     Allergies as of 08/27/2017 - Review Complete 08/27/2017  Allergen Reaction  Noted  . Statins Other (See Comments) 03/02/2014  . Aspirin  02/06/2011   Past Medical History:  Diagnosis Date  . Anxiety   . Basal cell cancer    left temple  . Depression   . Headache   . Hepatitis 1972   ? unknown type  . HTN (hypertension)   . Hx of cataract surgery 09/11/2015  . Hypercholesteremia   . OA (osteoarthritis)   . Otitis media, chronic   . Ulcerative colitis 2001   Last colonoscopy Dr [name] 12/28/09-> pan-UC, normal TI, maintained on Asacol and Imuran   Past Surgical History:  Procedure Laterality Date  . COLONOSCOPY  12/28/09   Dr. [name] colitis  . COLONOSCOPY N/A 02/01/2016   Procedure: COLONOSCOPY;  Surgeon: Daneil Dolin, MD;  [facility];  Service: Endoscopy;  Laterality: N/A;  1015  . CORONARY ARTERY BYPASS GRAFT N/A 02/25/2014   Procedure: CORONARY ARTERY BYPASS GRAFTING (CABG);  Surgeon: Ivin Poot, MD;  Location: [place];  Service: Open Heart Surgery;  Laterality: N/A;  . EYE SURGERY    . INTRAOPERATIVE TRANSESOPHAGEAL ECHOCARDIOGRAM N/A 02/25/2014   Procedure: INTRAOPERATIVE TRANSESOPHAGEAL ECHOCARDIOGRAM;  Surgeon: Ivin Poot, MD;  Location: [place];  Service: Open Heart Surgery;  Laterality: N/A;  . LEFT HEART CATHETERIZATION WITH CORONARY ANGIOGRAM N/A 02/23/2014   Procedure: LEFT HEART CATHETERIZATION WITH CORONARY ANGIOGRAM;  Surgeon: Burnell Blanks, MD;  Location: [facility] CATH LAB;  Service: Cardiovascular;  Laterality: N/A;  . SALPINGOOPHORECTOMY     right ovary, both tubes  . TONSILLECTOMY     Family History  Problem Relation Age of Onset  . Skin cancer Father   . Heart failure Mother   . HIV Brother   . Lung cancer Brother    Social History   Tobacco Use  . Smoking status: Former Smoker    Packs/day: 1.00    Years: 30.00    Pack years: 30.00    Types: Cigarettes    Last attempt to quit: 02/22/2014    Years since quitting: 3.5  . Smokeless tobacco: Never Used  . Tobacco comment: trying    Substance Use Topics  . Alcohol use: No    Alcohol/week: 0.0 oz  . Drug use: No    ROS:  General: Negative for anorexia, weight loss, fever, chills, fatigue, weakness. ENT: Negative for hoarseness, difficulty swallowing , nasal congestion. CV: Negative for chest pain, angina, palpitations, dyspnea on exertion, peripheral edema.  Respiratory: Negative for dyspnea at rest, dyspnea on exertion, cough, sputum, wheezing.  GI: See history of present illness. GU:  Negative for dysuria, hematuria, urinary incontinence, urinary frequency, nocturnal urination.  Endo: Negative for unusual weight change.    Physical Examination:   BP 137/68   Pulse 85   Temp (!) 97 F (36.1 C) (Oral)   Ht 5' 6"  (1.676 m)   Wt 220 lb 6.4 oz (100 kg)   BMI 35.57 kg/m   General: Well-nourished, well-developed in no acute distress.  Eyes: No icterus. Mouth: Oropharyngeal mucosa moist and pink , no lesions erythema or exudate. Lungs: Clear to auscultation bilaterally.  Heart: Regular rate and rhythm, no murmurs rubs or gallops.  Abdomen: Bowel sounds are normal, nontender, nondistended, no hepatosplenomegaly or masses, no abdominal bruits or hernia , no rebound or guarding.   Extremities: No lower extremity edema. No clubbing or deformities. Neuro: Alert and oriented x 4   Skin: Warm and dry, no jaundice.   Psych: Alert and cooperative, normal mood and affect.  Labs:  Lab Results  Component Value Date   CREATININE 1.10 (H) 08/08/2017   BUN 23 08/08/2017   NA 137 08/08/2017   K 3.7 08/08/2017   CL 103 08/08/2017   CO2 30 08/08/2017   Lab Results  Component Value Date   ALT 11 08/08/2017   AST 15 08/08/2017   ALKPHOS 43 02/01/2016   BILITOT 0.5 08/08/2017   Lab Results  Component Value Date   WBC 7.6 08/08/2017   HGB 12.4 08/08/2017   HCT 37.5 08/08/2017   MCV 85.0 08/08/2017   PLT 297 08/08/2017    Imaging Studies: No results found.

## 2017-08-27 NOTE — Telephone Encounter (Signed)
lmovm to scheduled TCS w/ RMR

## 2017-08-27 NOTE — Progress Notes (Signed)
CC'D TO PCP °

## 2017-08-27 NOTE — Patient Instructions (Signed)
Continue Imuran as before.   Return office visit in six months.   Colonoscopy as scheduled. See separate instructions.

## 2017-08-27 NOTE — Assessment & Plan Note (Signed)
History of pan ulcerative colitis, clinically has been doing well over the past 18 months.  Notably at time of her last colonoscopy in 2017 she had a patchy colitis in the setting of coming off of her UC medications.  Has been doing well on Imuran only.  Plans for surveillance colonoscopy in the near future.  I have discussed the risks, alternatives, benefits with regards to but not limited to the risk of reaction to medication, bleeding, infection, perforation and the patient is agreeable to proceed. Written consent to be obtained.  She will be due for repeat met 7 in July to follow-up on mildly elevated creatinine.  Return ov in six months for UC.

## 2017-08-28 ENCOUNTER — Other Ambulatory Visit: Payer: Self-pay | Admitting: *Deleted

## 2017-08-28 DIAGNOSIS — K5190 Ulcerative colitis, unspecified, without complications: Secondary | ICD-10-CM

## 2017-08-28 MED ORDER — PEG 3350-KCL-NA BICARB-NACL 420 G PO SOLR: 4000.0000 mL | Freq: Once | ORAL | 0 refills | Status: AC

## 2017-08-28 NOTE — Telephone Encounter (Signed)
Spoke with pt and she is scheduled for 10/15/17 at 11:15am. Prep sent into the pharmacy. Prep instructions mailed to her. Confirmed mailing address.

## 2017-09-09 ENCOUNTER — Encounter: Payer: Self-pay | Admitting: *Deleted

## 2017-09-09 ENCOUNTER — Other Ambulatory Visit: Payer: Self-pay | Admitting: *Deleted

## 2017-09-09 DIAGNOSIS — R748 Abnormal levels of other serum enzymes: Secondary | ICD-10-CM

## 2017-09-11 ENCOUNTER — Encounter (HOSPITAL_COMMUNITY): Payer: Self-pay

## 2017-09-11 ENCOUNTER — Ambulatory Visit (HOSPITAL_COMMUNITY)
Admission: RE | Admit: 2017-09-11 | Discharge: 2017-09-11 | Disposition: A | Discharge status: Home / Self Care | Payer: Medicare HMO | Source: Ambulatory Visit | Attending: Nurse Practitioner | Admitting: Nurse Practitioner

## 2017-09-11 DIAGNOSIS — M8589 Other specified disorders of bone density and structure, multiple sites: Secondary | ICD-10-CM

## 2017-09-11 DIAGNOSIS — Z1231 Encounter for screening mammogram for malignant neoplasm of breast: Secondary | ICD-10-CM | POA: Insufficient documentation

## 2017-10-02 LAB — BASIC METABOLIC PANEL
BUN/Creatinine Ratio: 16 (calc) (ref 6–22)
BUN: 16 mg/dL (ref 7–25)
CALCIUM: 10 mg/dL (ref 8.6–10.4)
CO2: 27 mmol/L (ref 20–32)
Chloride: 102 mmol/L (ref 98–110)
Creat: 1.03 mg/dL — ABNORMAL HIGH (ref 0.60–0.93)
Glucose, Bld: 102 mg/dL (ref 65–139)
POTASSIUM: 4.3 mmol/L (ref 3.5–5.3)
SODIUM: 137 mmol/L (ref 135–146)

## 2017-10-06 NOTE — Patient Instructions (Signed)
Called [employer]. No PA needed for TCS. Ref# [id number].

## 2017-10-07 ENCOUNTER — Telehealth: Payer: Self-pay | Admitting: *Deleted

## 2017-10-07 NOTE — Telephone Encounter (Signed)
Received call from patient stating she needs to r/s TCS scheduled for 10/15/17. She has came down with a resp infection. She is rescheduled to 11/26/17 at 12:00pm. Sherron Monday and LMOVM making aware of change. I have mailed new instructions to patient.

## 2017-11-25 ENCOUNTER — Encounter: Payer: Self-pay | Admitting: Cardiovascular Disease

## 2017-11-25 ENCOUNTER — Ambulatory Visit: Payer: Medicare HMO | Admitting: Cardiovascular Disease

## 2017-11-25 VITALS — BP 136/78 | HR 92 | Ht 66.0 in | Wt 213.0 lb

## 2017-11-25 DIAGNOSIS — I10 Essential (primary) hypertension: Secondary | ICD-10-CM | POA: Diagnosis not present

## 2017-11-25 DIAGNOSIS — E785 Hyperlipidemia, unspecified: Secondary | ICD-10-CM

## 2017-11-25 DIAGNOSIS — I25708 Atherosclerosis of coronary artery bypass graft(s), unspecified, with other forms of angina pectoris: Secondary | ICD-10-CM | POA: Diagnosis not present

## 2017-11-25 DIAGNOSIS — Z951 Presence of aortocoronary bypass graft: Secondary | ICD-10-CM

## 2017-11-25 MED ORDER — ASPIRIN EC 81 MG PO TBEC: 81.0000 mg | DELAYED_RELEASE_TABLET | Freq: Every day | ORAL | 3 refills | Status: DC

## 2017-11-25 MED ORDER — EVOLOCUMAB 140 MG/ML ~~LOC~~ SOAJ: 140.0000 mg | SUBCUTANEOUS | 11 refills | Status: DC

## 2017-11-25 MED ORDER — NITROGLYCERIN 0.4 MG SL SUBL: 0.4000 mg | SUBLINGUAL_TABLET | SUBLINGUAL | 3 refills | Status: DC | PRN

## 2017-11-25 MED ORDER — METOPROLOL TARTRATE 25 MG PO TABS: 12.5000 mg | ORAL_TABLET | Freq: Two times a day (BID) | ORAL | 3 refills | Status: DC

## 2017-11-25 NOTE — Progress Notes (Signed)
SUBJECTIVE: The patient presents for routine follow-up. She has a history of non-STEMI in November 2015 with multivessel coronary artery disease for which she underwent 3 vessel CABG with LIMA to LAD, SVG to diagonal, and SVG to RCA.  She has been tried on 3 or 4 statins in the past and she is intolerant due to diffuse myalgias.  ECG performed in the office today which I ordered and personally interpreted demonstrates normal sinus rhythm with late R wave transition.  She denies chest pain, palpitations, dizziness, leg swelling, and shortness of breath.  She is no longer on aspirin.  She has a history of ulcerative colitis but has not had a flare in the past 2 years.  Her husband had a CVA 1 year and 8 months ago and this has been stressful for her.  She takes care of all of his medications and takes them to all of his appointments.  She has bilateral knee pain and is being scheduled for an MRI by her PCP.  She has been unable to do any significant walking.   Soc Hx: Originally from [place], [place]. Numerous grandchildren in [place], [place], and [place]. Has a daughter in [place], [place].  Review of Systems: As per "subjective", otherwise negative.  Allergies  Allergen Reactions  . Statins Other (See Comments)    Muscle aches  . Aspirin     Current Outpatient Medications  Medication Sig Dispense Refill  . acetaminophen (TYLENOL) 500 MG tablet Take 500-1,000 mg by mouth every 6 (six) hours as needed for mild pain or headache.     . ALPRAZolam (XANAX) 0.5 MG tablet Take 0.5 mg by mouth daily as needed for anxiety.    Marland Kitchen azaTHIOprine (IMURAN) 50 MG tablet TAKE 3 TABLETS (150 MG TOTAL) BY MOUTH DAILY. 270 tablet 3  . cholecalciferol (VITAMIN D) 1000 UNITS tablet Take 1,000 Units by mouth daily.    . hydrochlorothiazide (MICROZIDE) 12.5 MG capsule Take 12.5 mg by mouth daily.  6  . Multiple Vitamin (MULTIVITAMIN WITH MINERALS) TABS tablet Take 1 tablet by mouth daily.    . nitroGLYCERIN (NITROSTAT) 0.4  MG SL tablet Place 1 tablet (0.4 mg total) under the tongue every 5 (five) minutes as needed for chest pain. 25 tablet 3  . Probiotic Product (PROBIOTIC FORMULA PO) Take 1 tablet by mouth every evening.      No current facility-administered medications for this visit.     Past Medical History:  Diagnosis Date  . Anxiety   . Basal cell cancer    left temple  . Depression   . Headache   . Hepatitis 1972   ? unknown type  . HTN (hypertension)   . Hx of cataract surgery 09/11/2015  . Hypercholesteremia   . OA (osteoarthritis)   . Otitis media, chronic   . Ulcerative colitis 2001   Last colonoscopy Dr [name] 12/28/09-> pan-UC, normal TI, maintained on Asacol and Imuran    Past Surgical History:  Procedure Laterality Date  . COLONOSCOPY  12/28/09   Dr. [name] colitis  . COLONOSCOPY N/A 02/01/2016   Procedure: COLONOSCOPY;  Surgeon: Daneil Dolin, MD;  [facility];  Service: Endoscopy;  Laterality: N/A;  1015  . CORONARY ARTERY BYPASS GRAFT N/A 02/25/2014   Procedure: CORONARY ARTERY BYPASS GRAFTING (CABG);  Surgeon: Ivin Poot, MD;  Location: [place];  Service: Open Heart Surgery;  Laterality: N/A;  . EYE SURGERY    . INTRAOPERATIVE TRANSESOPHAGEAL ECHOCARDIOGRAM N/A 02/25/2014  Procedure: INTRAOPERATIVE TRANSESOPHAGEAL ECHOCARDIOGRAM;  Surgeon: Ivin Poot, MD;  Location: [place];  Service: Open Heart Surgery;  Laterality: N/A;  . LEFT HEART CATHETERIZATION WITH CORONARY ANGIOGRAM N/A 02/23/2014   Procedure: LEFT HEART CATHETERIZATION WITH CORONARY ANGIOGRAM;  Surgeon: Burnell Blanks, MD;  Location: [facility] CATH LAB;  Service: Cardiovascular;  Laterality: N/A;  . SALPINGOOPHORECTOMY     right ovary, both tubes  . TONSILLECTOMY      Social History   Socioeconomic History  . Marital status: Divorced    Spouse name: Not on file  . Number of children: 3  . Years of education: Not on file  . Highest education level: Not on file  Occupational  History  . Occupation: retired    Fish farm manager: RETIRED    Comment: [employer]  Social Needs  . Financial resource strain: Not on file  . Food insecurity:    Worry: Not on file    Inability: Not on file  . Transportation needs:    Medical: Not on file    Non-medical: Not on file  Tobacco Use  . Smoking status: Former Smoker    Packs/day: 1.00    Years: 30.00    Pack years: 30.00    Types: Cigarettes    Last attempt to quit: 02/22/2014    Years since quitting: 3.7  . Smokeless tobacco: Never Used  . Tobacco comment: trying  Substance and Sexual Activity  . Alcohol use: No    Alcohol/week: 0.0 standard drinks  . Drug use: No  . Sexual activity: Never  Lifestyle  . Physical activity:    Days per week: Not on file    Minutes per session: Not on file  . Stress: Not on file  Relationships  . Social connections:    Talks on phone: Not on file    Gets together: Not on file    Attends religious service: Not on file    Active member of club or organization: Not on file    Attends meetings of clubs or organizations: Not on file    Relationship status: Not on file  . Intimate partner violence:    Fear of current or ex partner: Not on file    Emotionally abused: Not on file    Physically abused: Not on file    Forced sexual activity: Not on file  Other Topics Concern  . Not on file  Social History Narrative   Lost 1 daughter w/ meningitis     Vitals:   11/25/17 1439  BP: 136/78  Pulse: 92  SpO2: 96%  Weight: 213 lb (96.6 kg)  Height: 5' 6"  (1.676 m)    Wt Readings from Last 3 Encounters:  11/25/17 213 lb (96.6 kg)  08/27/17 220 lb 6.4 oz (100 kg)  06/03/17 222 lb 3.2 oz (100.8 kg)     PHYSICAL EXAM General: NAD HEENT: Normal. Neck: No JVD, no thyromegaly. Lungs: Clear to auscultation bilaterally with normal respiratory effort. CV: Regular rate and rhythm, normal S1/S2, no Z3/Y8, soft systolic murmur over RUSB. No pretibial or periankle edema.  No carotid  bruit.   Abdomen: Soft, nontender, no distention.  Neurologic: Alert and oriented.  Psych: Normal affect. Skin: Normal. Musculoskeletal: No gross deformities.    ECG: Reviewed above under Subjective   Labs: Lab Results  Component Value Date/Time   K 4.3 10/02/2017 11:17 AM   BUN 16 10/02/2017 11:17 AM   CREATININE 1.03 (H) 10/02/2017 11:17 AM   ALT 11 08/08/2017 11:22  AM   TSH 2.80 03/18/2014 02:17 PM   HGB 12.4 08/08/2017 11:22 AM     Lipids: Lab Results  Component Value Date/Time   LDLCALC 119 (H) 06/23/2014 09:52 AM   CHOL 185 06/23/2014 09:52 AM   TRIG 78.0 06/23/2014 09:52 AM   HDL 50.10 06/23/2014 09:52 AM       ASSESSMENT AND PLAN: 1. CAD with 3-vessel CABG:Symptomatically stable.  She is neither on aspirin nor a beta blocker.  Hemoglobin was normal on 08/08/2017 but had been 9.5 a year prior.   I will start aspirin 81 mg daily and metoprolol tartrate 12.5 mg twice daily.  She has been tried on 3 or 4 statins in the past and she is intolerant due to diffuse myalgias.  I will start [place].  2. Essential HTN: Controlled on hydrochlorothiazide. No changes.  3. Hyperlipidemia: She has been tried on 3 or 4 statins in the past and she is intolerant due to diffuse myalgias.  LDL 130 on 01/07/2017.  I will start [place].    Disposition: Follow up 1 year   Kate Sable, M.D., F.A.C.C.

## 2017-11-25 NOTE — Patient Instructions (Addendum)
Your physician wants you to follow-up in:1 year with Dr.Koneswaran You will receive a reminder letter in the mail two months in advance. If you don't receive a letter, please call our office to schedule the follow-up appointment.    START aspirin 81 mg daily  START Metoprolol 12.5 mg twice a day  START Repatha 140 mg inject into skin every 14 days    If you need a refill on your cardiac medications before your next appointment, please call your pharmacy.     No labs or tests today.    Thank you for choosing [facility] !

## 2017-11-26 ENCOUNTER — Ambulatory Visit (HOSPITAL_COMMUNITY)
Admission: RE | Admit: 2017-11-26 | Discharge: 2017-11-26 | Disposition: A | Discharge status: Home / Self Care | Payer: Medicare HMO | Source: Ambulatory Visit | Attending: Internal Medicine | Admitting: Internal Medicine

## 2017-11-26 ENCOUNTER — Encounter (HOSPITAL_COMMUNITY): Payer: Self-pay | Admitting: *Deleted

## 2017-11-26 ENCOUNTER — Other Ambulatory Visit: Payer: Self-pay

## 2017-11-26 ENCOUNTER — Encounter (HOSPITAL_COMMUNITY)
Admission: RE | Disposition: A | Discharge status: Home / Self Care | Payer: Self-pay | Source: Ambulatory Visit | Attending: Internal Medicine

## 2017-11-26 DIAGNOSIS — E7800 Pure hypercholesterolemia, unspecified: Secondary | ICD-10-CM | POA: Diagnosis not present

## 2017-11-26 DIAGNOSIS — Z7982 Long term (current) use of aspirin: Secondary | ICD-10-CM | POA: Insufficient documentation

## 2017-11-26 DIAGNOSIS — K5100 Ulcerative (chronic) pancolitis without complications: Secondary | ICD-10-CM | POA: Diagnosis not present

## 2017-11-26 DIAGNOSIS — I10 Essential (primary) hypertension: Secondary | ICD-10-CM | POA: Insufficient documentation

## 2017-11-26 DIAGNOSIS — F329 Major depressive disorder, single episode, unspecified: Secondary | ICD-10-CM | POA: Insufficient documentation

## 2017-11-26 DIAGNOSIS — K6389 Other specified diseases of intestine: Secondary | ICD-10-CM | POA: Diagnosis not present

## 2017-11-26 DIAGNOSIS — I2510 Atherosclerotic heart disease of native coronary artery without angina pectoris: Secondary | ICD-10-CM | POA: Insufficient documentation

## 2017-11-26 DIAGNOSIS — Z1211 Encounter for screening for malignant neoplasm of colon: Secondary | ICD-10-CM | POA: Diagnosis not present

## 2017-11-26 DIAGNOSIS — F17210 Nicotine dependence, cigarettes, uncomplicated: Secondary | ICD-10-CM | POA: Insufficient documentation

## 2017-11-26 DIAGNOSIS — Z951 Presence of aortocoronary bypass graft: Secondary | ICD-10-CM | POA: Diagnosis not present

## 2017-11-26 DIAGNOSIS — Z79899 Other long term (current) drug therapy: Secondary | ICD-10-CM | POA: Diagnosis not present

## 2017-11-26 DIAGNOSIS — F419 Anxiety disorder, unspecified: Secondary | ICD-10-CM | POA: Insufficient documentation

## 2017-11-26 DIAGNOSIS — K5190 Ulcerative colitis, unspecified, without complications: Secondary | ICD-10-CM

## 2017-11-26 HISTORY — PX: BIOPSY: SHX5522

## 2017-11-26 HISTORY — DX: Atherosclerotic heart disease of native coronary artery without angina pectoris: I25.10

## 2017-11-26 HISTORY — PX: COLONOSCOPY: SHX5424

## 2017-11-26 SURGERY — COLONOSCOPY
Anesthesia: Moderate Sedation

## 2017-11-26 MED ORDER — MIDAZOLAM HCL 5 MG/5ML IJ SOLN
INTRAMUSCULAR | Status: AC
  Filled 2017-11-26: qty 5

## 2017-11-26 MED ORDER — ONDANSETRON HCL 4 MG/2ML IJ SOLN
INTRAMUSCULAR | Status: DC | PRN
  Administered 2017-11-26: 4 mg via INTRAVENOUS

## 2017-11-26 MED ORDER — MEPERIDINE HCL 100 MG/ML IJ SOLN
INTRAMUSCULAR | Status: DC | PRN
  Administered 2017-11-26: 25 mg via INTRAVENOUS

## 2017-11-26 MED ORDER — MIDAZOLAM HCL 5 MG/5ML IJ SOLN
INTRAMUSCULAR | Status: DC | PRN
  Administered 2017-11-26 (×2): 2 mg via INTRAVENOUS

## 2017-11-26 MED ORDER — ONDANSETRON HCL 4 MG/2ML IJ SOLN
INTRAMUSCULAR | Status: AC
  Filled 2017-11-26: qty 2

## 2017-11-26 MED ORDER — STERILE WATER FOR IRRIGATION IR SOLN
Status: DC | PRN
  Administered 2017-11-26: 2.5 mL

## 2017-11-26 MED ORDER — MEPERIDINE HCL 50 MG/ML IJ SOLN
INTRAMUSCULAR | Status: AC
  Filled 2017-11-26: qty 1

## 2017-11-26 MED ORDER — SODIUM CHLORIDE 0.9 % IV SOLN
INTRAVENOUS | Status: DC
  Administered 2017-11-26: 1000 mL via INTRAVENOUS

## 2017-11-26 NOTE — Discharge Instructions (Signed)

## 2017-11-26 NOTE — H&P (Signed)
@LOGO @   Primary Care Physician:  Renee Rival, NP Primary Gastroenterologist:  Dr. [name]  Pre-Procedure History & Physical: HPI:  Bonnie Powers is a 72 y.o. female here for for surveillance colonoscopy.  Approximately 18 year history of UC.  Only bowel symptoms occasional constipation.  Past Medical History:  Diagnosis Date  . Anxiety   . Basal cell cancer    left temple  . Coronary artery disease   . Depression   . Headache   . Hepatitis 1972   ? unknown type  . HTN (hypertension)   . Hx of cataract surgery 09/11/2015  . Hypercholesteremia   . OA (osteoarthritis)   . Otitis media, chronic   . Ulcerative colitis 2001   Last colonoscopy Dr [name] 12/28/09-> pan-UC, normal TI, maintained on Asacol and Imuran    Past Surgical History:  Procedure Laterality Date  . CARDIAC CATHETERIZATION    . COLONOSCOPY  12/28/09   Dr. [name] colitis  . COLONOSCOPY N/A 02/01/2016   Procedure: COLONOSCOPY;  Surgeon: Daneil Dolin, MD;  [facility];  Service: Endoscopy;  Laterality: N/A;  1015  . CORONARY ARTERY BYPASS GRAFT N/A 02/25/2014   Procedure: CORONARY ARTERY BYPASS GRAFTING (CABG);  Surgeon: Ivin Poot, MD;  Location: [place];  Service: Open Heart Surgery;  Laterality: N/A;  . EYE SURGERY    . INTRAOPERATIVE TRANSESOPHAGEAL ECHOCARDIOGRAM N/A 02/25/2014   Procedure: INTRAOPERATIVE TRANSESOPHAGEAL ECHOCARDIOGRAM;  Surgeon: Ivin Poot, MD;  Location: [place];  Service: Open Heart Surgery;  Laterality: N/A;  . KNEE SURGERY Right   . LEFT HEART CATHETERIZATION WITH CORONARY ANGIOGRAM N/A 02/23/2014   Procedure: LEFT HEART CATHETERIZATION WITH CORONARY ANGIOGRAM;  Surgeon: Burnell Blanks, MD;  Location: [facility] CATH LAB;  Service: Cardiovascular;  Laterality: N/A;  . SALPINGOOPHORECTOMY     right ovary, both tubes  . TONSILLECTOMY      Prior to Admission medications   Medication Sig Start Date End Date Taking? Authorizing Provider   acetaminophen (TYLENOL) 500 MG tablet Take 500-1,000 mg by mouth every 6 (six) hours as needed for mild pain or headache.    Yes [provider]  ALPRAZolam Duanne Moron) 0.5 MG tablet Take 0.5 mg by mouth daily as needed for anxiety.   Yes [provider]  azaTHIOprine (IMURAN) 50 MG tablet TAKE 3 TABLETS (150 MG TOTAL) BY MOUTH DAILY. 03/17/17  Yes Carlis Stable, NP  cholecalciferol (VITAMIN D) 1000 UNITS tablet Take 1,000 Units by mouth daily.   Yes [provider]  hydrochlorothiazide (MICROZIDE) 12.5 MG capsule Take 12.5 mg by mouth daily. 10/19/15  Yes [provider]  Multiple Vitamin (MULTIVITAMIN WITH MINERALS) TABS tablet Take 1 tablet by mouth daily.   Yes [provider]  Probiotic Product (PROBIOTIC FORMULA PO) Take 1 tablet by mouth every evening.    Yes [provider]  aspirin EC 81 MG tablet Take 1 tablet (81 mg total) by mouth daily. 11/25/17   Herminio Commons, MD  Evolocumab (REPATHA SURECLICK) 675 MG/ML SOAJ Inject 140 mg into the skin every 14 (fourteen) days. 11/25/17   Herminio Commons, MD  metoprolol tartrate (LOPRESSOR) 25 MG tablet Take 0.5 tablets (12.5 mg total) by mouth 2 (two) times daily. 11/25/17 02/23/18  Herminio Commons, MD  nitroGLYCERIN (NITROSTAT) 0.4 MG SL tablet Place 1 tablet (0.4 mg total) under the tongue every 5 (five) minutes as needed for chest pain. 11/25/17   Herminio Commons, MD    Allergies  as of 08/28/2017 - Review Complete 08/27/2017  Allergen Reaction Noted  . Statins Other (See Comments) 03/02/2014  . Aspirin  02/06/2011    Family History  Problem Relation Age of Onset  . Skin cancer Father   . Heart failure Mother   . HIV Brother   . Lung cancer Brother     Social History   Socioeconomic History  . Marital status: Divorced    Spouse name: Not on file  . Number of children: 3  . Years of education: Not on file  . Highest education level: Not on file  Occupational  History  . Occupation: retired    Fish farm manager: RETIRED    Comment: [employer]  Social Needs  . Financial resource strain: Not on file  . Food insecurity:    Worry: Not on file    Inability: Not on file  . Transportation needs:    Medical: Not on file    Non-medical: Not on file  Tobacco Use  . Smoking status: Current Some Day Smoker    Packs/day: 1.00    Years: 30.00    Pack years: 30.00    Types: Cigarettes    Last attempt to quit: 02/22/2014    Years since quitting: 3.7  . Smokeless tobacco: Never Used  . Tobacco comment: trying  Substance and Sexual Activity  . Alcohol use: No    Alcohol/week: 0.0 standard drinks  . Drug use: No  . Sexual activity: Never  Lifestyle  . Physical activity:    Days per week: Not on file    Minutes per session: Not on file  . Stress: Not on file  Relationships  . Social connections:    Talks on phone: Not on file    Gets together: Not on file    Attends religious service: Not on file    Active member of club or organization: Not on file    Attends meetings of clubs or organizations: Not on file    Relationship status: Not on file  . Intimate partner violence:    Fear of current or ex partner: Not on file    Emotionally abused: Not on file    Physically abused: Not on file    Forced sexual activity: Not on file  Other Topics Concern  . Not on file  Social History Narrative   Lost 1 daughter w/ meningitis    Review of Systems: See HPI, otherwise negative ROS  Physical Exam: BP (!) 150/71   Pulse 80   Temp 97.8 F (36.6 C) (Oral)   Resp (!) 25   Ht 5' 6"  (1.676 m)   Wt 94.3 kg   SpO2 97%   BMI 33.57 kg/m  General:   Alert,  Well-developed, well-nourished, pleasant and cooperative in NAD Neck:  Supple; no masses or thyromegaly. No significant cervical adenopathy. Lungs:  Clear throughout to auscultation.   No wheezes, crackles, or rhonchi. No acute distress. Heart:  Regular rate and rhythm; no murmurs, clicks, rubs,  or  gallops. Abdomen: Non-distended, normal bowel sounds.  Soft and nontender without appreciable mass or hepatosplenomegaly.  Pulses:  Normal pulses noted. Extremities:  Without clubbing or edema.  Impression/Plan:    72 year old lady with pan- UC.  Here for surveillance colonoscopy.  The risks, benefits, limitations, alternatives and imponderables have been reviewed with the patient. Questions have been answered. All parties are agreeable.      Notice: This dictation was prepared with Dragon dictation along with smaller phrase technology. Any  transcriptional errors that result from this process are unintentional and may not be corrected upon review.

## 2017-11-26 NOTE — Op Note (Signed)
[facility] Patient Name: Bonnie Powers Procedure Date: 11/26/2017 11:21 AM MRN: [id number] Date of Birth: 01/19/1946 Attending MD: Norvel Richards , MD CSN: [id number] Age: 72 Admit Type: Outpatient Procedure:                Colonoscopy Indications:              High risk colon cancer surveillance: Ulcerative                            pancolitis of 8 (or more) years duration Providers:                Norvel Richards, MD, Lurline Del, RN, Aram Candela Referring MD:              Medicines:                Midazolam 5 mg IV, Meperidine 25 mg IV Complications:            No immediate complications. Estimated Blood Loss:     Estimated blood loss was minimal. Estimated blood                            loss was minimal. Procedure:                Pre-Anesthesia Assessment:                           - Prior to the procedure, a History and Physical                            was performed, and patient medications and                            allergies were reviewed. The patient's tolerance of                            previous anesthesia was also reviewed. The risks                            and benefits of the procedure and the sedation                            options and risks were discussed with the patient.                            All questions were answered, and informed consent                            was obtained. Prior Anticoagulants: The patient has                            taken no previous anticoagulant or antiplatelet  agents. ASA Grade Assessment: II - A patient with                            mild systemic disease. After reviewing the risks                            and benefits, the patient was deemed in                            satisfactory condition to undergo the procedure.                           After obtaining informed consent, the colonoscope                            was passed under  direct vision. Throughout the                            procedure, the patient's blood pressure, pulse, and                            oxygen saturations were monitored continuously. The                            CF-[id number] ([id number]) scope was introduced through                            the anus and advanced to the 5 cm into the ileum.                            The colonoscopy was performed without difficulty.                            The patient tolerated the procedure well. The                            quality of the bowel preparation was adequate. Scope In: 12:11:24 PM Scope Out: 12:24:22 PM Scope Withdrawal Time: 0 hours 10 minutes 31 seconds  Total Procedure Duration: 0 hours 12 minutes 58 seconds  Findings:      The perianal and digital rectal examinations were normal.      Diffuse granular / pale colonic mucosa; no polyp or nodule seen. Couple       tiny erosions in the ascending and descending segment. Rectum less       involved but not completely normal-appearing. Multiple erosions seen in       the terminal ileum. segmental biopsies taken throughout terminal ileum       and lower GI tract. Impression:               - Mildly diffusely abnormal rectum colon and                            terminal ileum as described above. status post  segmental biopsy Moderate Sedation:      Moderate (conscious) sedation was administered by the endoscopy nurse       and supervised by the endoscopist. The following parameters were       monitored: oxygen saturation, heart rate, blood pressure, respiratory       rate, EKG, adequacy of pulmonary ventilation, and response to care.       Total physician intraservice time was 17 minutes. Recommendation:           - Patient has a contact number available for                            emergencies. The signs and symptoms of potential                            delayed complications were discussed with the                             patient. Return to normal activities tomorrow.                            Written discharge instructions were provided to the                            patient.                           - Advance diet as tolerated.                           - Continue present medications.                           - Repeat colonoscopy date to be determined after                            pending pathology results are reviewed for                            surveillance based on pathology results.                           - Return to GI office (date not yet determined). Procedure Code(s):        --- Professional ---                           [phone], Colonoscopy, flexible; diagnostic, including                            collection of specimen(s) by brushing or washing,                            when performed (separate procedure)                           G0500, Moderate sedation services provided by the  same physician or other qualified health care                            professional performing a gastrointestinal                            endoscopic service that sedation supports,                            requiring the presence of an independent trained                            observer to assist in the monitoring of the                            patient's level of consciousness and physiological                            status; initial 15 minutes of intra-service time;                            patient age 44 years or older (additional time may                            be reported with [phone], as appropriate) Diagnosis Code(s):        --- Professional ---                           K51.00, Ulcerative (chronic) pancolitis without                            complications                           K63.89, Other specified diseases of intestine CPT copyright 2017 American Medical Association. All rights reserved. The codes documented in this report are  preliminary and upon coder review may  be revised to meet current compliance requirements. Cristopher Estimable. Sudais Banghart, MD Norvel Richards, MD 11/26/2017 12:32:44 PM This report has been signed electronically. Number of Addenda: 0

## 2017-12-01 ENCOUNTER — Encounter (HOSPITAL_COMMUNITY): Payer: Self-pay | Admitting: Internal Medicine

## 2017-12-01 ENCOUNTER — Telehealth: Payer: Self-pay | Admitting: Cardiovascular Disease

## 2017-12-01 NOTE — Telephone Encounter (Signed)
Pt can complete pt assistance form for [employer], states she will stop by in next two days

## 2017-12-05 ENCOUNTER — Encounter: Payer: Self-pay | Admitting: Internal Medicine

## 2017-12-09 ENCOUNTER — Telehealth: Payer: Self-pay

## 2017-12-09 NOTE — Telephone Encounter (Signed)
Per RMR- Send letter to patient.  Send copy of letter with path to referring provider and PCP.   Repeat BMET sometime in September to f/u on slightly elevated creatinine

## 2017-12-12 ENCOUNTER — Other Ambulatory Visit: Payer: Self-pay

## 2017-12-12 DIAGNOSIS — R748 Abnormal levels of other serum enzymes: Secondary | ICD-10-CM

## 2017-12-12 NOTE — Telephone Encounter (Signed)
Spoke with pt, she was notified that she will receive letter, and have lab work done. Lab orders were placed and released. Please send to pcp.

## 2017-12-18 ENCOUNTER — Telehealth: Payer: Self-pay | Admitting: Internal Medicine

## 2017-12-18 NOTE — Telephone Encounter (Signed)
Pt is going Tuesday to have her labs done at [facility] and wanted to make sure the lab orders were there.

## 2017-12-18 NOTE — Telephone Encounter (Signed)
PT is aware orders are in.

## 2017-12-23 ENCOUNTER — Ambulatory Visit: Payer: Medicare HMO | Admitting: Orthopaedic Surgery

## 2017-12-23 ENCOUNTER — Encounter: Payer: Self-pay | Admitting: Orthopaedic Surgery

## 2017-12-23 ENCOUNTER — Ambulatory Visit (INDEPENDENT_AMBULATORY_CARE_PROVIDER_SITE_OTHER): Payer: Medicare HMO

## 2017-12-23 VITALS — BP 154/67 | HR 70 | Ht 66.0 in | Wt 211.0 lb

## 2017-12-23 DIAGNOSIS — M25562 Pain in left knee: Secondary | ICD-10-CM

## 2017-12-23 DIAGNOSIS — M25561 Pain in right knee: Secondary | ICD-10-CM | POA: Diagnosis not present

## 2017-12-23 DIAGNOSIS — G8929 Other chronic pain: Secondary | ICD-10-CM | POA: Diagnosis not present

## 2017-12-23 DIAGNOSIS — F17210 Nicotine dependence, cigarettes, uncomplicated: Secondary | ICD-10-CM | POA: Diagnosis not present

## 2017-12-23 LAB — BASIC METABOLIC PANEL
BUN: 17 mg/dL (ref 7–25)
CALCIUM: 9.8 mg/dL (ref 8.6–10.4)
CHLORIDE: 102 mmol/L (ref 98–110)
CO2: 26 mmol/L (ref 20–32)
CREATININE: 0.86 mg/dL (ref 0.60–0.93)
Glucose, Bld: 94 mg/dL (ref 65–99)
POTASSIUM: 3.8 mmol/L (ref 3.5–5.3)
Sodium: 137 mmol/L (ref 135–146)

## 2017-12-23 NOTE — Progress Notes (Signed)
Subjective:    Patient ID: Bonnie Powers, female    DOB: Jul 22, 1945, 72 y.o.   MRN: [id number]  HPI She has chronic pain of both knees.  She has pain and popping of the knees as well as swelling.  She has no trauma.  The left knee is more painful.  She has no giving way.  She has no redness.  She cannot take any NSAID as she has colitis.    She takes Tylenol, uses rubs and ice.  She says they do not help much.  She has had injections in the past and they helped.  She has no recent x-rays.  I did arthroscopy of the right knee in 2011, November.  She has done well from that until the last year or so.  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  She says she cannot have any surgery now because of his health issues.   Review of Systems  Constitutional: Positive for activity change.  Endocrine: Positive for cold intolerance.  Musculoskeletal: Positive for arthralgias, gait problem and joint swelling.  Allergic/Immunologic: Positive for environmental allergies.  Psychiatric/Behavioral: The patient is nervous/anxious.    For Review of Systems, all other systems reviewed and are negative.  The following is a summary of the past history medically, past history surgically, known current medicines, social history and family history.  This information is gathered electronically by the computer from prior information and documentation.  I review this each visit and have found including this information at this point in the chart is beneficial and informative.   Past Medical History:  Diagnosis Date  . Anxiety   . Basal cell cancer    left temple  . Coronary artery disease   . Depression   . Headache   . Hepatitis 1972   ? unknown type  . HTN (hypertension)   . Hx of cataract surgery 09/11/2015  . Hypercholesteremia   . OA (osteoarthritis)   . Otitis media, chronic   . Ulcerative colitis 2001   Last colonoscopy Dr [name] 12/28/09->  pan-UC, normal TI, maintained on Asacol and Imuran    Past Surgical History:  Procedure Laterality Date  . BIOPSY  11/26/2017   Procedure: BIOPSY;  Surgeon: Daneil Dolin, MD;  Location: [facility];  Service: Endoscopy;;  biopsies of normal terminal, ascending, transverse, descending, sigmoid, rectal  . CARDIAC CATHETERIZATION    . COLONOSCOPY  12/28/09   Dr. [name] colitis  . COLONOSCOPY N/A 02/01/2016   Procedure: COLONOSCOPY;  Surgeon: Daneil Dolin, MD;  Location: [facility];  Service: Endoscopy;  Laterality: N/A;  1015  . COLONOSCOPY N/A 11/26/2017   Procedure: COLONOSCOPY;  Surgeon: Daneil Dolin, MD;  Location: [facility];  Service: Endoscopy;  Laterality: N/A;  11:15am  . CORONARY ARTERY BYPASS GRAFT N/A 02/25/2014   Procedure: CORONARY ARTERY BYPASS GRAFTING (CABG);  Surgeon: Ivin Poot, MD;  Location: [place];  Service: Open Heart Surgery;  Laterality: N/A;  . EYE SURGERY    . INTRAOPERATIVE TRANSESOPHAGEAL ECHOCARDIOGRAM N/A 02/25/2014   Procedure: INTRAOPERATIVE TRANSESOPHAGEAL ECHOCARDIOGRAM;  Surgeon: Ivin Poot, MD;  Location: [place];  Service: Open Heart Surgery;  Laterality: N/A;  . KNEE SURGERY Right   . LEFT HEART CATHETERIZATION WITH CORONARY ANGIOGRAM N/A 02/23/2014   Procedure: LEFT HEART CATHETERIZATION WITH CORONARY ANGIOGRAM;  Surgeon: Burnell Blanks, MD;  Location: [facility];  Service: Cardiovascular;  Laterality: N/A;  . SALPINGOOPHORECTOMY     right ovary, both tubes  . TONSILLECTOMY      Current Outpatient Medications on File Prior to Visit  Medication Sig Dispense Refill  . acetaminophen (TYLENOL) 500 MG tablet Take 500-1,000 mg by mouth every 6 (six) hours as needed for mild pain or headache.     . ALPRAZolam (XANAX) 0.5 MG tablet Take 0.5 mg by mouth daily as needed for anxiety.    Marland Kitchen aspirin EC 81 MG tablet Take 1 tablet (81 mg total) by mouth daily. 90 tablet 3  . azaTHIOprine (IMURAN) 50 MG tablet TAKE 3 TABLETS  (150 MG TOTAL) BY MOUTH DAILY. 270 tablet 3  . cholecalciferol (VITAMIN D) 1000 UNITS tablet Take 1,000 Units by mouth daily.    . hydrochlorothiazide (MICROZIDE) 12.5 MG capsule Take 12.5 mg by mouth daily.  6  . metoprolol tartrate (LOPRESSOR) 25 MG tablet Take 0.5 tablets (12.5 mg total) by mouth 2 (two) times daily. 90 tablet 3  . Multiple Vitamin (MULTIVITAMIN WITH MINERALS) TABS tablet Take 1 tablet by mouth daily.    . nitroGLYCERIN (NITROSTAT) 0.4 MG SL tablet Place 1 tablet (0.4 mg total) under the tongue every 5 (five) minutes as needed for chest pain. 25 tablet 3  . Probiotic Product (PROBIOTIC FORMULA PO) Take 1 tablet by mouth every evening.     . Evolocumab (REPATHA SURECLICK) 540 MG/ML SOAJ Inject 140 mg into the skin every 14 (fourteen) days. (Patient not taking: Reported on 12/23/2017) 1 pen 11   No current facility-administered medications on file prior to visit.     Social History   Socioeconomic History  . Marital status: Divorced    Spouse name: Not on file  . Number of children: 3  . Years of education: Not on file  . Highest education level: Not on file  Occupational History  . Occupation: retired    Fish farm manager: RETIRED    Comment: [employer]  Social Needs  . Financial resource strain: Not on file  . Food insecurity:    Worry: Not on file    Inability: Not on file  . Transportation needs:    Medical: Not on file    Non-medical: Not on file  Tobacco Use  . Smoking status: Current Some Day Smoker    Packs/day: 1.00    Years: 30.00    Pack years: 30.00    Types: Cigarettes    Last attempt to quit: 02/22/2014    Years since quitting: 3.8  . Smokeless tobacco: Never Used  . Tobacco comment: smokes about 3 cigarettes per week, formerly heavy smoker  Substance and Sexual Activity  . Alcohol use: No    Alcohol/week: 0.0 standard drinks  . Drug use: No  . Sexual activity: Never  Lifestyle  . Physical activity:    Days per week: Not on file    Minutes  per session: Not on file  . Stress: Not on file  Relationships  . Social connections:    Talks on phone: Not on file    Gets together: Not on file    Attends religious service: Not on file    Active member of club or organization: Not on file    Attends meetings of clubs or organizations: Not on file    Relationship status: Not on file  . Intimate partner violence:    Fear of current or ex partner: Not on file    Emotionally abused: Not on file  Physically abused: Not on file    Forced sexual activity: Not on file  Other Topics Concern  . Not on file  Social History Narrative   Lost 1 daughter w/ meningitis    Family History  Problem Relation Age of Onset  . Skin cancer Father   . Heart failure Mother   . HIV Brother   . Lung cancer Brother     BP (!) 154/67   Pulse 70   Ht 5' 6"  (1.676 m)   Wt 211 lb (95.7 kg)   BMI 34.06 kg/m   Body mass index is 34.06 kg/m.      Objective:   Physical Exam  Constitutional: She is oriented to person, place, and time. She appears well-developed and well-nourished.  HENT:  Head: Normocephalic and atraumatic.  Eyes: Pupils are equal, round, and reactive to light. Conjunctivae and EOM are normal.  Neck: Normal range of motion. Neck supple.  Cardiovascular: Normal rate, regular rhythm and intact distal pulses.  Pulmonary/Chest: Effort normal.  Abdominal: Soft.  Musculoskeletal:       Right knee: She exhibits decreased range of motion, swelling and effusion. Tenderness found. Medial joint line tenderness noted.       Left knee: She exhibits decreased range of motion, swelling and effusion. Tenderness found. Medial joint line tenderness noted.       Legs: Neurological: She is alert and oriented to person, place, and time. She has normal reflexes. She displays normal reflexes. No cranial nerve deficit. She exhibits normal muscle tone. Coordination normal.  Skin: Skin is warm and dry.  Psychiatric: She has a normal mood and affect.  Her behavior is normal. Judgment and thought content normal.    X-rays were done of the knees bilaterally, reported separately.      Assessment & Plan:   Encounter Diagnoses  Name Primary?  . Chronic pain of both knees Yes  . Cigarette nicotine dependence without complication    PROCEDURE NOTE:  The patient requests injections of the left knee , verbal consent was obtained.  The left knee was prepped appropriately after time out was performed.   Sterile technique was observed and injection of 1 cc of Depo-Medrol 40 mg with several cc's of plain xylocaine. Anesthesia was provided by ethyl chloride and a 20-gauge needle was used to inject the knee area. The injection was tolerated well.  A band aid dressing was applied.  The patient was advised to apply ice later today and tomorrow to the injection sight as needed.  PROCEDURE NOTE:  The patient requests injections of the right knee , verbal consent was obtained.  The right knee was prepped appropriately after time out was performed.   Sterile technique was observed and injection of 1 cc of Depo-Medrol 40 mg with several cc's of plain xylocaine. Anesthesia was provided by ethyl chloride and a 20-gauge needle was used to inject the knee area. The injection was tolerated well.  A band aid dressing was applied.  The patient was advised to apply ice later today and tomorrow to the injection sight as needed.  I have told her she is a candidate for total knees. She says she cannot do now because of her husband's illness and she takes care of him.  She does have some friends who come into the house to help and allowed her to come here today.  I have talked about injections of prednisone in the knees.  She is agreeable to that and I did it today.  I have talked about having home health visit her home to help with her husband and other activities such as that.  She want to go to [place] to visit her daughter, son and grand children.  She  wants to go to [place] to see her great-grandson.  I have given her some ideas about arranging things here at home to do it.  She will check into the suggestions.  I will see her in one month.  Call if any problem.  Precautions discussed.   Electronically Signed Sanjuana Kava, MD 9/17/201911:03 AM

## 2017-12-23 NOTE — Patient Instructions (Signed)
Steps to Quit Smoking Smoking tobacco can be bad for your health. It can also affect almost every organ in your body. Smoking puts you and people around you at risk for many serious long-lasting (chronic) diseases. Quitting smoking is hard, but it is one of the best things that you can do for your health. It is never too late to quit. What are the benefits of quitting smoking? When you quit smoking, you lower your risk for getting serious diseases and conditions. They can include:  Lung cancer or lung disease.  Heart disease.  Stroke.  Heart attack.  Not being able to have children (infertility).  Weak bones (osteoporosis) and broken bones (fractures).  If you have coughing, wheezing, and shortness of breath, those symptoms may get better when you quit. You may also get sick less often. If you are pregnant, quitting smoking can help to lower your chances of having a baby of low birth weight. What can I do to help me quit smoking? Talk with your doctor about what can help you quit smoking. Some things you can do (strategies) include:  Quitting smoking totally, instead of slowly cutting back how much you smoke over a period of time.  Going to in-person counseling. You are more likely to quit if you go to many counseling sessions.  Using resources and support systems, such as: ? Online chats with a counselor. ? Phone quitlines. ? Printed self-help materials. ? Support groups or group counseling. ? Text messaging programs. ? Mobile phone apps or applications.  Taking medicines. Some of these medicines may have nicotine in them. If you are pregnant or breastfeeding, do not take any medicines to quit smoking unless your doctor says it is okay. Talk with your doctor about counseling or other things that can help you.  Talk with your doctor about using more than one strategy at the same time, such as taking medicines while you are also going to in-person counseling. This can help make  quitting easier. What things can I do to make it easier to quit? Quitting smoking might feel very hard at first, but there is a lot that you can do to make it easier. Take these steps:  Talk to your family and friends. Ask them to support and encourage you.  Call phone quitlines, reach out to support groups, or work with a counselor.  Ask people who smoke to not smoke around you.  Avoid places that make you want (trigger) to smoke, such as: ? Bars. ? Parties. ? Smoke-break areas at work.  Spend time with people who do not smoke.  Lower the stress in your life. Stress can make you want to smoke. Try these things to help your stress: ? Getting regular exercise. ? Deep-breathing exercises. ? Yoga. ? Meditating. ? Doing a body scan. To do this, close your eyes, focus on one area of your body at a time from head to toe, and notice which parts of your body are tense. Try to relax the muscles in those areas.  Download or buy apps on your mobile phone or tablet that can help you stick to your quit plan. There are many free apps, such as QuitGuide from the CDC (Centers for Disease Control and Prevention). You can find more support from smokefree.gov and other websites.  This information is not intended to replace advice given to you by your health care provider. Make sure you discuss any questions you have with your health care provider. Document Released: 01/19/2009 Document   Revised: 11/21/2015 Document Reviewed: 08/09/2014 Elsevier Interactive Patient Education  2018 Elsevier Inc.  

## 2018-01-20 ENCOUNTER — Ambulatory Visit: Payer: Medicare HMO | Admitting: Orthopaedic Surgery

## 2018-02-03 ENCOUNTER — Ambulatory Visit: Payer: Medicare HMO | Admitting: Orthopaedic Surgery

## 2018-02-03 ENCOUNTER — Encounter: Payer: Self-pay | Admitting: Orthopaedic Surgery

## 2018-02-03 VITALS — BP 154/76 | HR 82 | Ht 66.0 in | Wt 212.0 lb

## 2018-02-03 DIAGNOSIS — M25562 Pain in left knee: Secondary | ICD-10-CM | POA: Diagnosis not present

## 2018-02-03 DIAGNOSIS — G8929 Other chronic pain: Secondary | ICD-10-CM | POA: Diagnosis not present

## 2018-02-03 DIAGNOSIS — M25561 Pain in right knee: Secondary | ICD-10-CM | POA: Diagnosis not present

## 2018-02-03 DIAGNOSIS — F17210 Nicotine dependence, cigarettes, uncomplicated: Secondary | ICD-10-CM

## 2018-02-03 NOTE — Patient Instructions (Signed)
Steps to Quit Smoking Smoking tobacco can be bad for your health. It can also affect almost every organ in your body. Smoking puts you and people around you at risk for many serious long-lasting (chronic) diseases. Quitting smoking is hard, but it is one of the best things that you can do for your health. It is never too late to quit. What are the benefits of quitting smoking? When you quit smoking, you lower your risk for getting serious diseases and conditions. They can include:  Lung cancer or lung disease.  Heart disease.  Stroke.  Heart attack.  Not being able to have children (infertility).  Weak bones (osteoporosis) and broken bones (fractures).  If you have coughing, wheezing, and shortness of breath, those symptoms may get better when you quit. You may also get sick less often. If you are pregnant, quitting smoking can help to lower your chances of having a baby of low birth weight. What can I do to help me quit smoking? Talk with your doctor about what can help you quit smoking. Some things you can do (strategies) include:  Quitting smoking totally, instead of slowly cutting back how much you smoke over a period of time.  Going to in-person counseling. You are more likely to quit if you go to many counseling sessions.  Using resources and support systems, such as: ? Online chats with a counselor. ? Phone quitlines. ? Printed self-help materials. ? Support groups or group counseling. ? Text messaging programs. ? Mobile phone apps or applications.  Taking medicines. Some of these medicines may have nicotine in them. If you are pregnant or breastfeeding, do not take any medicines to quit smoking unless your doctor says it is okay. Talk with your doctor about counseling or other things that can help you.  Talk with your doctor about using more than one strategy at the same time, such as taking medicines while you are also going to in-person counseling. This can help make  quitting easier. What things can I do to make it easier to quit? Quitting smoking might feel very hard at first, but there is a lot that you can do to make it easier. Take these steps:  Talk to your family and friends. Ask them to support and encourage you.  Call phone quitlines, reach out to support groups, or work with a counselor.  Ask people who smoke to not smoke around you.  Avoid places that make you want (trigger) to smoke, such as: ? Bars. ? Parties. ? Smoke-break areas at work.  Spend time with people who do not smoke.  Lower the stress in your life. Stress can make you want to smoke. Try these things to help your stress: ? Getting regular exercise. ? Deep-breathing exercises. ? Yoga. ? Meditating. ? Doing a body scan. To do this, close your eyes, focus on one area of your body at a time from head to toe, and notice which parts of your body are tense. Try to relax the muscles in those areas.  Download or buy apps on your mobile phone or tablet that can help you stick to your quit plan. There are many free apps, such as QuitGuide from the CDC (Centers for Disease Control and Prevention). You can find more support from smokefree.gov and other websites.  This information is not intended to replace advice given to you by your health care provider. Make sure you discuss any questions you have with your health care provider. Document Released: 01/19/2009 Document   Revised: 11/21/2015 Document Reviewed: 08/09/2014 Elsevier Interactive Patient Education  2018 Elsevier Inc.  

## 2018-02-03 NOTE — Progress Notes (Signed)
CC: Both of my knees are hurting. I would like an injection in both knees.  The patient has had chronic pain and tenderness of both knees for some time.  Injections help.  There is no locking or giving way of the knee.  There is no new trauma. There is no redness or signs of infections.  The knees have a mild effusion and some crepitus.  There is no redness or signs of recent trauma.  Right knee ROM is 0-110 and left knee ROM is 0-105.  Impression:  Chronic pain of the both knees  Return:  1 month  PROCEDURE NOTE:  The patient requests injections of both knees, verbal consent was obtained.  The left and right knee were individually prepped appropriately after time out was performed.   Sterile technique was observed and injection of 1 cc of Depo-Medrol 40 mg with several cc's of plain xylocaine. Anesthesia was provided by ethyl chloride and a 20-gauge needle was used to inject each knee area. The injections were tolerated well.  A band aid dressing was applied.  The patient was advised to apply ice later today and tomorrow to the injection sight as needed.  Electronically Signed Sanjuana Kava, MD 10/29/20199:05 AM

## 2018-03-04 ENCOUNTER — Encounter: Payer: Self-pay | Admitting: Gastroenterology

## 2018-03-04 ENCOUNTER — Ambulatory Visit: Payer: Medicare HMO | Admitting: Gastroenterology

## 2018-03-04 VITALS — BP 143/75 | HR 80 | Temp 97.9°F | Ht 66.0 in | Wt 209.8 lb

## 2018-03-04 DIAGNOSIS — K5100 Ulcerative (chronic) pancolitis without complications: Secondary | ICD-10-CM | POA: Diagnosis not present

## 2018-03-04 DIAGNOSIS — R143 Flatulence: Secondary | ICD-10-CM

## 2018-03-04 NOTE — Patient Instructions (Signed)
1. Trial of Restora in place of your current probiotic. Take one daily until samples are out. If helpful with bowel function and gas you can continue daily for several months.  2. Linzess 140mg samples provided today to try if you have recurrent constipation. Use only when needed. You can take only one per day and advised to take on empty stomach. May cause several loose stools. 3. Look at FODMAP sheet. Limit foods from the HIGH column as these can increase gas/bloating.  4. Return to the office in six months or call sooner if needed.

## 2018-03-04 NOTE — Progress Notes (Signed)
Primary Care Physician: Renee Rival, NP  Primary Gastroenterologist:  Garfield Cornea, MD   Chief Complaint  Patient presents with  . Gas  . Constipation    HPI: Bonnie Powers is a 72 y.o. female here for follow-up.  History of ulcerative colitis diagnosed in 2001.  Colonoscopy in 2017 with patchy colitis in the setting of coming off of UC medications.  Endoscopically it appeared more like Crohn's rather than UC although no plans to change the diagnosis per Dr. [name].  Last colonoscopy in August 2019, diffuse granular/pale colonic mucosa. couple of tiny erosions in ascending and descending segment. rectum less involved but not completely normal. multiple erosions in TI. segmental bxs with mildly active inflammation in TI, sigmoid colon, inactive chronic proctitis and descending colitis. Next tcs 2 years  Has a lot of gas/flatulence. Suffering from constipation. No big flare ups  Of UC in the past couple of years. Was on antibiotic last week for throats/ears (?Augmentin). Diarrhea for few days. Has some constipation few weeks back and used OTC laxative. Overall bowels stable. Complains of gas/bloating. Has tried Beano and Gas-X without relief. Currently on [employer]. No abd pain, melena, brbpr. No heartburn, n/v, dysphagia.    Current Outpatient Medications  Medication Sig Dispense Refill  . acetaminophen (TYLENOL) 500 MG tablet Take 500-1,000 mg by mouth every 6 (six) hours as needed for mild pain or headache.     . ALPRAZolam (XANAX) 0.5 MG tablet Take 0.5 mg by mouth daily as needed for anxiety.    Marland Kitchen aspirin EC 81 MG tablet Take 1 tablet (81 mg total) by mouth daily. 90 tablet 3  . azaTHIOprine (IMURAN) 50 MG tablet TAKE 3 TABLETS (150 MG TOTAL) BY MOUTH DAILY. 270 tablet 3  . CALCIUM PO Take by mouth daily.    . cholecalciferol (VITAMIN D) 1000 UNITS tablet Take 1,000 Units by mouth daily.    . hydrochlorothiazide (MICROZIDE) 12.5 MG capsule Take 12.5 mg by  mouth daily.  6  . metoprolol tartrate (LOPRESSOR) 25 MG tablet Take 0.5 tablets (12.5 mg total) by mouth 2 (two) times daily. 90 tablet 3  . Multiple Vitamin (MULTIVITAMIN WITH MINERALS) TABS tablet Take 1 tablet by mouth daily.    . nitroGLYCERIN (NITROSTAT) 0.4 MG SL tablet Place 1 tablet (0.4 mg total) under the tongue every 5 (five) minutes as needed for chest pain. 25 tablet 3  . Probiotic Product (PROBIOTIC FORMULA PO) Take 1 tablet by mouth every evening.     . Evolocumab (REPATHA SURECLICK) 793 MG/ML SOAJ Inject 140 mg into the skin every 14 (fourteen) days. (Patient not taking: Reported on 12/23/2017) 1 pen 11   No current facility-administered medications for this visit.     Allergies as of 03/04/2018 - Review Complete 03/04/2018  Allergen Reaction Noted  . Statins Other (See Comments) 03/02/2014  . Aspirin  02/06/2011    ROS:  General: Negative for anorexia, weight loss, fever, chills, fatigue, weakness. ENT: Negative for hoarseness, difficulty swallowing , nasal congestion. CV: Negative for chest pain, angina, palpitations, dyspnea on exertion, peripheral edema.  Respiratory: Negative for dyspnea at rest, dyspnea on exertion, cough, sputum, wheezing.  GI: See history of present illness. GU:  Negative for dysuria, hematuria, urinary incontinence, urinary frequency, nocturnal urination.  Endo: Negative for unusual weight change.    Physical Examination:   BP (!) 143/75   Pulse 80   Temp 97.9 F (36.6 C) (Oral)   Ht 5' 6"  (  1.676 m)   Wt 209 lb 12.8 oz (95.2 kg)   BMI 33.86 kg/m   General: Well-nourished, well-developed in no acute distress.  Eyes: No icterus. Mouth: Oropharyngeal mucosa moist and pink , no lesions erythema or exudate. Lungs: Clear to auscultation bilaterally.  Heart: Regular rate and rhythm, no murmurs rubs or gallops.  Abdomen: Bowel sounds are normal, nontender, nondistended, no hepatosplenomegaly or masses, no abdominal bruits or hernia , no  rebound or guarding.   Extremities: No lower extremity edema. No clubbing or deformities. Neuro: Alert and oriented x 4   Skin: Warm and dry, no jaundice.   Psych: Alert and cooperative, normal mood and affect.  Labs:  Lab Results  Component Value Date   CREATININE 0.86 12/23/2017   BUN 17 12/23/2017   NA 137 12/23/2017   K 3.8 12/23/2017   CL 102 12/23/2017   CO2 26 12/23/2017   Lab Results  Component Value Date   ALT 11 08/08/2017   AST 15 08/08/2017   ALKPHOS 50 08/08/2017   BILITOT 0.5 08/08/2017   Lab Results  Component Value Date   WBC 7.6 08/08/2017   HGB 12.4 08/08/2017   HCT 37.5 08/08/2017   MCV 85.0 08/08/2017   PLT 297 08/08/2017    Imaging Studies: No results found.

## 2018-03-07 ENCOUNTER — Encounter: Payer: Self-pay | Admitting: Gastroenterology

## 2018-03-07 NOTE — Assessment & Plan Note (Signed)
Clinically doing well. Continues Imuran. Recently with some constipation but resolved. She has tendency towards constipation. Given samples of Linzess to try as needed. She has tried OTC laxatives with delayed response and making it more difficulty to leave her house.   Return to the office in six months.

## 2018-03-07 NOTE — Assessment & Plan Note (Signed)
Bloating and flatulence. Switch probiotic to Restora for next 2 weeks. Limit HIGH FODMAP foods. Call if no improvement. Otherwise OV in six months.

## 2018-03-10 NOTE — Progress Notes (Signed)
cc'd to pcp 

## 2018-03-17 ENCOUNTER — Ambulatory Visit: Payer: Medicare HMO | Admitting: Orthopaedic Surgery

## 2018-04-14 ENCOUNTER — Ambulatory Visit: Payer: Medicare HMO | Admitting: Orthopaedic Surgery

## 2018-04-14 ENCOUNTER — Encounter: Payer: Self-pay | Admitting: Orthopaedic Surgery

## 2018-04-14 VITALS — BP 142/86 | HR 77 | Ht 66.0 in | Wt 207.0 lb

## 2018-04-14 DIAGNOSIS — M25562 Pain in left knee: Secondary | ICD-10-CM

## 2018-04-14 DIAGNOSIS — G8929 Other chronic pain: Secondary | ICD-10-CM

## 2018-04-14 DIAGNOSIS — F17210 Nicotine dependence, cigarettes, uncomplicated: Secondary | ICD-10-CM | POA: Diagnosis not present

## 2018-04-14 DIAGNOSIS — M25561 Pain in right knee: Secondary | ICD-10-CM | POA: Diagnosis not present

## 2018-04-14 MED ORDER — PREDNISONE 5 MG (21) PO TBPK: ORAL_TABLET | ORAL | 0 refills | Status: DC

## 2018-04-14 NOTE — Patient Instructions (Signed)
Steps to Quit Smoking    Smoking tobacco can be bad for your health. It can also affect almost every organ in your body. Smoking puts you and people around you at risk for many serious long-lasting (chronic) diseases. Quitting smoking is hard, but it is one of the best things that you can do for your health. It is never too late to quit.  What are the benefits of quitting smoking?  When you quit smoking, you lower your risk for getting serious diseases and conditions. They can include:  · Lung cancer or lung disease.  · Heart disease.  · Stroke.  · Heart attack.  · Not being able to have children (infertility).  · Weak bones (osteoporosis) and broken bones (fractures).  If you have coughing, wheezing, and shortness of breath, those symptoms may get better when you quit. You may also get sick less often. If you are pregnant, quitting smoking can help to lower your chances of having a baby of low birth weight.  What can I do to help me quit smoking?  Talk with your doctor about what can help you quit smoking. Some things you can do (strategies) include:  · Quitting smoking totally, instead of slowly cutting back how much you smoke over a period of time.  · Going to in-person counseling. You are more likely to quit if you go to many counseling sessions.  · Using resources and support systems, such as:  ? Online chats with a counselor.  ? Phone quitlines.  ? Printed self-help materials.  ? Support groups or group counseling.  ? Text messaging programs.  ? Mobile phone apps or applications.  · Taking medicines. Some of these medicines may have nicotine in them. If you are pregnant or breastfeeding, do not take any medicines to quit smoking unless your doctor says it is okay. Talk with your doctor about counseling or other things that can help you.  Talk with your doctor about using more than one strategy at the same time, such as taking medicines while you are also going to in-person counseling. This can help make  quitting easier.  What things can I do to make it easier to quit?  Quitting smoking might feel very hard at first, but there is a lot that you can do to make it easier. Take these steps:  · Talk to your family and friends. Ask them to support and encourage you.  · Call phone quitlines, reach out to support groups, or work with a counselor.  · Ask people who smoke to not smoke around you.  · Avoid places that make you want (trigger) to smoke, such as:  ? Bars.  ? Parties.  ? Smoke-break areas at work.  · Spend time with people who do not smoke.  · Lower the stress in your life. Stress can make you want to smoke. Try these things to help your stress:  ? Getting regular exercise.  ? Deep-breathing exercises.  ? Yoga.  ? Meditating.  ? Doing a body scan. To do this, close your eyes, focus on one area of your body at a time from head to toe, and notice which parts of your body are tense. Try to relax the muscles in those areas.  · Download or buy apps on your mobile phone or tablet that can help you stick to your quit plan. There are many free apps, such as QuitGuide from the CDC (Centers for Disease Control and Prevention). You can find more   support from smokefree.gov and other websites.  This information is not intended to replace advice given to you by your health care provider. Make sure you discuss any questions you have with your health care provider.  Document Released: 01/19/2009 Document Revised: 11/21/2015 Document Reviewed: 08/09/2014  Elsevier Interactive Patient Education © 2019 Elsevier Inc.

## 2018-04-14 NOTE — Progress Notes (Signed)
Patient XB:[id number] Bonnie Powers, female DOB:[birth year], 73 y.o. AST:[id number]  Chief Complaint  Patient presents with  . Knee Pain    wants injection right knee, ready to proceed with surgery left knee    HPI  Bonnie Powers is a 73 y.o. female who has left knee pain that is getting worse and worse.  The injection only lasts a few days. She has popping and swelling and some giving way.  She has had x-rays in the past showing significant degenerative changes more medially.  She has talked with her family and has decided to have a total knee on the left in April of this year.  I will have Dr. Aline Brochure see her as I no longer do any surgery.  She is agreeable to this.  I will call in prednisone dose pack.     Body mass index is 33.41 kg/m.  ROS  Review of Systems  Constitutional: Positive for activity change.  Endocrine: Positive for cold intolerance.  Musculoskeletal: Positive for arthralgias, gait problem and joint swelling.  Allergic/Immunologic: Positive for environmental allergies.  Psychiatric/Behavioral: The patient is nervous/anxious.     All other systems reviewed and are negative.  The following is a summary of the past history medically, past history surgically, known current medicines, social history and family history.  This information is gathered electronically by the computer from prior information and documentation.  I review this each visit and have found including this information at this point in the chart is beneficial and informative.    Past Medical History:  Diagnosis Date  . Anxiety   . Basal cell cancer    left temple  . Coronary artery disease   . Depression   . Headache   . Hepatitis 1972   ? unknown type  . HTN (hypertension)   . Hx of cataract surgery 09/11/2015  . Hypercholesteremia   . OA (osteoarthritis)   . Otitis media, chronic   . Ulcerative colitis 2001   Last colonoscopy Dr [name] 12/28/09-> pan-UC, normal TI, maintained on Asacol and Imuran     Past Surgical History:  Procedure Laterality Date  . BIOPSY  11/26/2017   Procedure: BIOPSY;  Surgeon: Daneil Dolin, MD;  [facility]: [facility];  Service: Endoscopy;;  biopsies of normal terminal, ascending, transverse, descending, sigmoid, rectal  . CARDIAC CATHETERIZATION    . COLONOSCOPY  12/28/09   Dr. [name] colitis  . COLONOSCOPY N/A 02/01/2016   Procedure: COLONOSCOPY;  Surgeon: Daneil Dolin, MD;  Location: [facility];  Service: Endoscopy;  Laterality: N/A;  1015  . COLONOSCOPY N/A 11/26/2017   Dr. [name]: diffuse granular/pale colonic mucosa. couple of tiny erosions in ascending and descending segment. rectum less involved but not completely normal. multiple erosions in TI. segmental bxs with mildly active inflammation in TI, sigmoid colon, inactive chronic proctitis, colitis in descending colon. . next tcs 2 years  . CORONARY ARTERY BYPASS GRAFT N/A 02/25/2014   Procedure: CORONARY ARTERY BYPASS GRAFTING (CABG);  Surgeon: Ivin Poot, MD;  Location: [facility];  Service: Open Heart Surgery;  Laterality: N/A;  . EYE SURGERY    . INTRAOPERATIVE TRANSESOPHAGEAL ECHOCARDIOGRAM N/A 02/25/2014   Procedure: INTRAOPERATIVE TRANSESOPHAGEAL ECHOCARDIOGRAM;  Surgeon: Ivin Poot, MD;  Location: [place];  Service: Open Heart Surgery;  Laterality: N/A;  . KNEE SURGERY Right   . LEFT HEART CATHETERIZATION WITH CORONARY ANGIOGRAM N/A 02/23/2014   Procedure: LEFT HEART CATHETERIZATION WITH CORONARY ANGIOGRAM;  Surgeon: Burnell Blanks, MD;  Location:  [facility];  Service: Cardiovascular;  Laterality: N/A;  . SALPINGOOPHORECTOMY     right ovary, both tubes  . TONSILLECTOMY      Family History  Problem Relation Age of Onset  . Skin cancer Father   . Heart failure Mother   . HIV Brother   . Lung cancer Brother     Social History Social History   Tobacco Use  . Smoking status: Current Some Day Smoker    Packs/day: 1.00    Years: 30.00    Pack years: 30.00     Types: Cigarettes    Last attempt to quit: 02/22/2014    Years since quitting: 4.1  . Smokeless tobacco: Never Used  . Tobacco comment: smokes about 3 cigarettes per week, formerly heavy smoker  Substance Use Topics  . Alcohol use: No    Alcohol/week: 0.0 standard drinks  . Drug use: No    Allergies  Allergen Reactions  . Statins Other (See Comments)    Muscle aches  . Aspirin     Current Outpatient Medications  Medication Sig Dispense Refill  . acetaminophen (TYLENOL) 500 MG tablet Take 500-1,000 mg by mouth every 6 (six) hours as needed for mild pain or headache.     . ALPRAZolam (XANAX) 0.5 MG tablet Take 0.5 mg by mouth daily as needed for anxiety.    Marland Kitchen aspirin EC 81 MG tablet Take 1 tablet (81 mg total) by mouth daily. 90 tablet 3  . azaTHIOprine (IMURAN) 50 MG tablet TAKE 3 TABLETS (150 MG TOTAL) BY MOUTH DAILY. 270 tablet 3  . CALCIUM PO Take by mouth daily.    . cholecalciferol (VITAMIN D) 1000 UNITS tablet Take 1,000 Units by mouth daily.    . hydrochlorothiazide (MICROZIDE) 12.5 MG capsule Take 12.5 mg by mouth daily.  6  . Multiple Vitamin (MULTIVITAMIN WITH MINERALS) TABS tablet Take 1 tablet by mouth daily.    . nitroGLYCERIN (NITROSTAT) 0.4 MG SL tablet Place 1 tablet (0.4 mg total) under the tongue every 5 (five) minutes as needed for chest pain. 25 tablet 3  . Probiotic Product (PROBIOTIC FORMULA PO) Take 1 tablet by mouth every evening.     . metoprolol tartrate (LOPRESSOR) 25 MG tablet Take 0.5 tablets (12.5 mg total) by mouth 2 (two) times daily. 90 tablet 3   No current facility-administered medications for this visit.      Physical Exam  Blood pressure (!) 142/86, pulse 77, height 5' 6"  (1.676 m), weight 207 lb (93.9 kg).  Constitutional: overall normal hygiene, normal nutrition, well developed, normal grooming, normal body habitus. Assistive device:none  Musculoskeletal: gait and station Limp left, muscle tone and strength are normal, no tremors or  atrophy is present.  .  Neurological: coordination overall normal.  Deep tendon reflex/nerve stretch intact.  Sensation normal.  Cranial nerves II-XII intact.   Skin:   Normal overall no scars, lesions, ulcers or rashes. No psoriasis.  Psychiatric: Alert and oriented x 3.  Recent memory intact, remote memory unclear.  Normal mood and affect. Well groomed.  Good eye contact.  Cardiovascular: overall no swelling, no varicosities, no edema bilaterally, normal temperatures of the legs and arms, no clubbing, cyanosis and good capillary refill.  Lymphatic: palpation is normal.  Left knee has effusion, crepitus, ROM 0 to 105 with medial pain, limp to the left.  NV intact.  All other systems reviewed and are negative   The patient has been educated about the nature of the problem(s) and counseled  on treatment options.  The patient appeared to understand what I have discussed and is in agreement with it.  Encounter Diagnoses  Name Primary?  . Chronic pain of both knees Yes  . Cigarette nicotine dependence without complication     PLAN Call if any problems.  Precautions discussed.  Continue current medications. I have called in prednisone dose pack.  Return to clinic to see Dr. Aline Brochure.   Electronically Signed Sanjuana Kava, MD 1/7/20209:21 AM

## 2018-04-15 ENCOUNTER — Encounter: Payer: Self-pay | Admitting: Orthopedic Surgery

## 2018-04-15 ENCOUNTER — Ambulatory Visit: Payer: Medicare HMO | Admitting: Orthopedic Surgery

## 2018-04-15 VITALS — BP 142/69 | HR 76 | Ht 66.0 in | Wt 207.0 lb

## 2018-04-15 DIAGNOSIS — M25561 Pain in right knee: Secondary | ICD-10-CM

## 2018-04-15 DIAGNOSIS — G8929 Other chronic pain: Secondary | ICD-10-CM | POA: Diagnosis not present

## 2018-04-15 DIAGNOSIS — M25562 Pain in left knee: Secondary | ICD-10-CM | POA: Diagnosis not present

## 2018-04-15 DIAGNOSIS — M1710 Unilateral primary osteoarthritis, unspecified knee: Secondary | ICD-10-CM | POA: Diagnosis not present

## 2018-04-15 NOTE — Patient Instructions (Signed)
You have decided to proceed with knee replacement surgery. You have decided not to continue with nonoperative measures such as but not limited to oral medication, weight loss, activity modification, physical therapy, bracing, or injection.  We will perform the procedure commonly known as total knee replacement. Some of the risks associated with knee replacement surgery include but are not limited to Bleeding Infection Swelling Stiffness Blood clot Pulmonary embolism  Loosening of the implant Pain that persists even after surgery  Infection is especially devastating complication of knee surgery although rare. If infection does occur your implant will usually have to be removed and several surgeries and antibiotics will be needed to eradicate the infection prior to performing a repeat replacement.   In some cases amputation is required to eradicate the infection. In other rare cases a knee fusion is needed   In compliance with recent [place] law in federal regulation regarding opioid use and abuse and addiction, we will taper (stop) opioid medication after 2 weeks.  If you're not comfortable with these risks and would like to continue with nonoperative treatment please let Dr. Aline Brochure know prior to your surgery.

## 2018-04-15 NOTE — Progress Notes (Signed)
PREOP CONSULT/REFERRAL INTRA-OFFICE FROM DR Tyrone Apple   Chief Complaint  Patient presents with  . Knee Pain    Left    73 year old female had a coronary artery bypass graft in 2015 for non-STEMI MI with 2 blockages currently has no chest pain or shortness of breath  She is concerned about care of her husband who had a stroke and she will require family members to come and assist her if she decides to proceed with left total knee.  She complains of over 1 year history of left knee pain primarily over the medial joint with catching slipping and locking sensation.  The pain is dull and it interferes with her walking standing sitting and getting out of a chair  Her pain is become severe at this point and she wishes to proceed with knee replacement.   Review of Systems  Respiratory: Negative for shortness of breath and wheezing.   Cardiovascular: Negative for chest pain.  Musculoskeletal: Negative for back pain.  Neurological: Negative for tingling.  All other systems reviewed and are negative.    Past Medical History:  Diagnosis Date  . Anxiety   . Basal cell cancer    left temple  . Coronary artery disease   . Depression   . Headache   . Hepatitis 1972   ? unknown type  . HTN (hypertension)   . Hx of cataract surgery 09/11/2015  . Hypercholesteremia   . OA (osteoarthritis)   . Otitis media, chronic   . Ulcerative colitis 2001   Last colonoscopy Dr [name] 12/28/09-> pan-UC, normal TI, maintained on Asacol and Imuran    Past Surgical History:  Procedure Laterality Date  . BIOPSY  11/26/2017   Procedure: BIOPSY;  Surgeon: Daneil Dolin, MD;  [facility];  Service: Endoscopy;;  biopsies of normal terminal, ascending, transverse, descending, sigmoid, rectal  . CARDIAC CATHETERIZATION    . COLONOSCOPY  12/28/09   Dr. [name] colitis  . COLONOSCOPY N/A 02/01/2016   Procedure: COLONOSCOPY;  Surgeon: Daneil Dolin, MD;  [facility];   Service: Endoscopy;  Laterality: N/A;  1015  . COLONOSCOPY N/A 11/26/2017   Dr. [name]: diffuse granular/pale colonic mucosa. couple of tiny erosions in ascending and descending segment. rectum less involved but not completely normal. multiple erosions in TI. segmental bxs with mildly active inflammation in TI, sigmoid colon, inactive chronic proctitis, colitis in descending colon. . next tcs 2 years  . CORONARY ARTERY BYPASS GRAFT N/A 02/25/2014   Procedure: CORONARY ARTERY BYPASS GRAFTING (CABG);  Surgeon: Ivin Poot, MD;  Location: [place];  Service: Open Heart Surgery;  Laterality: N/A;  . EYE SURGERY    . INTRAOPERATIVE TRANSESOPHAGEAL ECHOCARDIOGRAM N/A 02/25/2014   Procedure: INTRAOPERATIVE TRANSESOPHAGEAL ECHOCARDIOGRAM;  Surgeon: Ivin Poot, MD;  Location: [place];  Service: Open Heart Surgery;  Laterality: N/A;  . KNEE SURGERY Right   . LEFT HEART CATHETERIZATION WITH CORONARY ANGIOGRAM N/A 02/23/2014   Procedure: LEFT HEART CATHETERIZATION WITH CORONARY ANGIOGRAM;  Surgeon: Burnell Blanks, MD;  Location: [facility];  Service: Cardiovascular;  Laterality: N/A;  . SALPINGOOPHORECTOMY     right ovary, both tubes  . TONSILLECTOMY      Family History  Problem Relation Age of Onset  . Skin cancer Father   . Heart failure Mother   . HIV Brother   . Lung cancer Brother    Social History   Tobacco Use  . Smoking status: Current Some Day Smoker  Packs/day: 1.00    Years: 30.00    Pack years: 30.00    Types: Cigarettes    Last attempt to quit: 02/22/2014    Years since quitting: 4.1  . Smokeless tobacco: Never Used  . Tobacco comment: smokes about 3 cigarettes per week, formerly heavy smoker  Substance Use Topics  . Alcohol use: No    Alcohol/week: 0.0 standard drinks  . Drug use: No    Allergies  Allergen Reactions  . Statins Other (See Comments)    Muscle aches  . Aspirin      Current Meds  Medication Sig  . acetaminophen (TYLENOL) 500 MG tablet  Take 500-1,000 mg by mouth every 6 (six) hours as needed for mild pain or headache.   . ALPRAZolam (XANAX) 0.5 MG tablet Take 0.5 mg by mouth daily as needed for anxiety.  Marland Kitchen aspirin EC 81 MG tablet Take 1 tablet (81 mg total) by mouth daily.  Marland Kitchen azaTHIOprine (IMURAN) 50 MG tablet TAKE 3 TABLETS (150 MG TOTAL) BY MOUTH DAILY.  Marland Kitchen CALCIUM PO Take by mouth daily.  . cholecalciferol (VITAMIN D) 1000 UNITS tablet Take 1,000 Units by mouth daily.  . hydrochlorothiazide (MICROZIDE) 12.5 MG capsule Take 12.5 mg by mouth daily.  . Multiple Vitamin (MULTIVITAMIN WITH MINERALS) TABS tablet Take 1 tablet by mouth daily.  . nitroGLYCERIN (NITROSTAT) 0.4 MG SL tablet Place 1 tablet (0.4 mg total) under the tongue every 5 (five) minutes as needed for chest pain.  . Probiotic Product (PROBIOTIC FORMULA PO) Take 1 tablet by mouth every evening.     BP (!) 142/69   Pulse 76   Ht 5' 6"  (1.676 m)   Wt 207 lb (93.9 kg)   BMI 33.41 kg/m   Physical Exam   Constitutional exam vital signs are stable appearance is normal in terms of development nutrition body habitus no deformities well-groomed  Peripheral vascular system observe no swelling or varicose veins palpated normal temperature without edema or tenderness  Ambulatory status normal with a slight antalgic gait on the left side  No lymphadenopathy left lower extremity or right lower extremity  Right Knee Exam   Muscle Strength  The patient has normal right knee strength.  Tenderness  The patient is experiencing tenderness in the medial joint line.  Range of Motion  Extension: normal  Flexion: 130   Tests  McMurray:  Medial - negative Lateral - negative Varus: negative Valgus: negative Drawer:  Anterior - negative    Posterior - negative  Other  Erythema: absent Scars: absent Sensation: normal Pulse: present Swelling: none   Left Knee Exam   Muscle Strength  The patient has normal left knee strength.  Tenderness  The patient is  experiencing tenderness in the medial joint line.  Range of Motion  Extension: normal  Flexion: 130   Tests  McMurray:  Medial - negative Lateral - negative Varus: negative Valgus: negative Drawer:  Anterior - negative     Posterior - negative  Other  Erythema: absent Scars: absent Sensation: normal Pulse: present Swelling: none      MEDICAL DECISION SECTION  xrays ordered? NO   Films were previously taken in the office  Left knee  My independent reading of xrays: Mild varus of the left knee.  Bone quality normal.  Joint space narrowing severe medial side.  Secondary bone changes are noted as well.  Right knee also arthritic with varus deformity joint space narrowing spurs cysts and sclerosis   Encounter Diagnoses  Name  Primary?  . Primary localized osteoarthritis of knee left Yes  . Chronic pain of both knees      PLAN:   Surgical procedure planned: Left total knee arthroplasty  The procedure has been fully reviewed with the patient; The risks and benefits of surgery have been discussed and explained and understood. Alternative treatment has also been reviewed, questions were encouraged and answered. The postoperative plan is also been reviewed.  Nonsurgical treatment as described in the history and physical section was attempted and unsuccessful and the patient has agreed to proceed with surgical intervention to improve their situation.  Note: patient has been told by GI Dr. [name] not to take aspirin although cardiology has her on 81 mg a day  She has ulcerative colitis and takes Imuran will review this with him prior to surgery but should not be an issue.  We can proceed with aspirin as DVT prophylaxis as well.    No orders of the defined types were placed in this encounter.   Arther Abbott, MD 04/15/2018 2:26 PM

## 2018-05-12 ENCOUNTER — Other Ambulatory Visit: Payer: Self-pay | Admitting: Nurse Practitioner

## 2018-05-12 DIAGNOSIS — K51011 Ulcerative (chronic) pancolitis with rectal bleeding: Secondary | ICD-10-CM

## 2018-06-25 ENCOUNTER — Telehealth: Payer: Self-pay | Admitting: Radiology

## 2018-06-25 NOTE — Telephone Encounter (Signed)
Will prepare letter for patient. Her surgery has been postponed due to the COVID 19, her daughters need letter so they can cancel their planned trips to help her.   I have placed on your desk for signature, please

## 2018-06-26 NOTE — Telephone Encounter (Signed)
May 12th is acceptable with patient to RS surgery.

## 2018-07-09 ENCOUNTER — Other Ambulatory Visit (HOSPITAL_COMMUNITY): Payer: Medicare HMO

## 2018-07-16 ENCOUNTER — Telehealth: Payer: Self-pay | Admitting: Radiology

## 2018-07-16 NOTE — Telephone Encounter (Signed)
I spoke to patient about RS surgery until June 9th.

## 2018-08-04 NOTE — Progress Notes (Signed)
REVIEWED-NO ADDITIONAL RECOMMENDATIONS. 

## 2018-08-13 ENCOUNTER — Other Ambulatory Visit (HOSPITAL_COMMUNITY): Payer: Medicare HMO

## 2018-08-17 ENCOUNTER — Other Ambulatory Visit: Payer: Self-pay | Admitting: Orthopedic Surgery

## 2018-08-18 ENCOUNTER — Encounter: Payer: Self-pay | Admitting: Internal Medicine

## 2018-08-27 ENCOUNTER — Other Ambulatory Visit: Payer: Self-pay | Admitting: Orthopedic Surgery

## 2018-08-27 DIAGNOSIS — M1710 Unilateral primary osteoarthritis, unspecified knee: Secondary | ICD-10-CM

## 2018-09-02 ENCOUNTER — Other Ambulatory Visit (HOSPITAL_COMMUNITY): Payer: Self-pay | Admitting: Nurse Practitioner

## 2018-09-02 DIAGNOSIS — Z1231 Encounter for screening mammogram for malignant neoplasm of breast: Secondary | ICD-10-CM

## 2018-09-08 NOTE — Patient Instructions (Signed)
Bonnie Powers  09/08/2018     @PREFPERIOPPHARMACY @   Your procedure is scheduled on   09/15/2018   Report to [facility] at  29  A.M.  Call this number if you have problems the morning of surgery:  [phone]   Remember:  Do not eat or drink after midnight.                        Take these medicines the morning of surgery with A SIP OF WATER  Xanax(if needed), imuran, metoprolol.    Do not wear jewelry, make-up or nail polish.  Do not wear lotions, powders, or perfumes, or deodorant.  Do not shave 48 hours prior to surgery.  Men may shave face and neck.  Do not bring valuables to the hospital.  [facility] is not responsible for any belongings or valuables.  Contacts, dentures or bridgework may not be worn into surgery.  Leave your suitcase in the car.  After surgery it may be brought to your room.  For patients admitted to the hospital, discharge time will be determined by your treatment team.  Patients discharged the day of surgery will not be allowed to drive home.   Name and phone number of your driver:   family Special instructions:  None  Please read over the following fact sheets that you were given. Pain Booklet, Coughing and Deep Breathing, Total Joint Packet, MRSA Information, Surgical Site Infection Prevention, Anesthesia Post-op Instructions and Care and Recovery After Surgery       Total Knee Replacement, Care After Refer to this sheet in the next few weeks. These instructions provide you with information about caring for yourself after your procedure. Your health care provider may also give you more specific instructions. Your treatment has been planned according to current medical practices, but problems sometimes occur. Call your health care provider if you have any problems or questions after your procedure. What can I expect after the procedure? After the procedure, it is common to have:  Pain and swelling.  A small amount of blood or  clear fluid coming from your incision.  Limited range of motion. Follow these instructions at home: Medicines  Take over-the-counter and prescription medicines only as told by your health care provider.  If you were prescribed an antibiotic medicine, take it as told by your health care provider. Do not stop taking the antibiotic even if you start to feel better.  If you were prescribed a blood thinner (anticoagulant), take it as told by your health care provider. Bathing  Do not take baths, swim, or use a hot tub until your health care provider approves. Ask your health care provider if you can take showers. You may only be allowed to take sponge baths for bathing.  If you have an immobilizer that is not waterproof, cover it with a watertight covering when you take a bath or shower.  Keep your bandage (dressing) dry until your health care provider says it can be removed. Incision care and drain care   Check your incision area and drain site every day for signs of infection. Check for: ? More redness, swelling, or pain. ? More fluid or blood. ? Warmth. ? Pus or a bad smell.  Follow instructions from your health care provider about how to take care of your incision. Make sure you: ? Wash your hands with soap and water before you change your dressing.  If soap and water are not available, use alcohol-based hand sanitizer. ? Change your dressing as told by your health care provider. ? Leave stitches (sutures), skin glue, or adhesive strips in place. These skin closures may need to stay in place for 2 weeks or longer. If adhesive strip edges start to loosen and curl up, you may trim the loose edges. Do not remove adhesive strips completely unless your health care provider tells you to do that.  If you have a drain, follow instructions from your health care provider about caring for it. Do not remove the drain tube or any dressings around the tube opening unless your health care provider  approves. Managing pain, stiffness, and swelling      If directed, put ice on your knee. ? Put ice in a plastic bag or use the icing device (cold flow pad or cryocuff) that you were given. Follow instructions from your health care provider about how to use the icing device. ? Place a towel between your skin and the bag or between your skin and the icing device. ? Leave the ice on for 20 minutes, 2-3 times per day.  If directed, apply heat to the affected area as often as told by your health care provider. Use the heat source that your health care provider recommends, such as a moist heat pack or a heating pad. ? Place a towel between your skin and the heat source. ? Leave the heat on for 20-30 minutes. ? Remove the heat if your skin turns bright red. This is especially important if you are unable to feel pain, heat, or cold. You may have a greater risk of getting burned.  Move your toes often to avoid stiffness and to lessen swelling.  Raise (elevate) your knee above the level of your heart while you are sitting or lying down.  Wear elastic knee support for as long as told by your health care provider. Driving   Do not drive until your health care provider approves. Ask your health care provider when it is safe to drive if you have an immobilizer on your knee.  Do not drive or operate heavy machinery while taking prescription pain medicine.  Do not drive for 24 hours if you received a sedative. Activity  Do not play contact sports until your health care provider approves.  Avoid high-impact activities, including running, jumping rope, and jumping jacks.  Avoid sitting for a long time without moving. Get up and move around at least every few hours.  If physical therapy was prescribed, do exercises as told by your health care provider.  Return to your normal activities as told by your health care provider. Ask your health care provider what activities are safe for you. Safety   Do not use your leg to support your body weight until your health care provider approves. Use crutches or a walker as told by your health care provider. General instructions  Do not have any dental work done for at least 3 months after your surgery. When you do have dental work done, tell your dentist about your joint replacement.  Do not use any tobacco products, such as cigarettes, chewing tobacco, or e-cigarettes. If you need help quitting, ask your health care provider.  Wear compression stockings as told by your health care provider. These stockings help to prevent blood clots and reduce swelling in your legs.  If you have been sent home with a continuous passive motion machine, use it as told by  your health care provider.  Drink enough fluid to keep your urine clear or pale yellow.  If you have been instructed to lose weight, follow instructions from your health care provider about how to do this safely.  Keep all follow-up visits as told by your health care provider. This is important. Contact a health care provider if:  You have more redness, swelling, or pain around your incision or drain.  You have more fluid or blood coming from your incision or drain.  Your incision or drain site feels warm to the touch.  You have pus or a bad smell coming from your incision or drain.  You have a fever.  Your incision breaks open after your health care provider removes your sutures, skin glue, or adhesive tape.  Your prosthesis feels loose.  You have knee pain that does not go away. Get help right away if:  You have a rash.  You have pain or swelling in your calf or thigh.  You have shortness of breath or difficulty breathing.  You have chest pain.  Your range of motion in your knee is getting worse. This information is not intended to replace advice given to you by your health care provider. Make sure you discuss any questions you have with your health care provider. Document  Released: 10/12/2004 Document Revised: 08/18/2016 Document Reviewed: 03/01/2015 Elsevier Interactive Patient Education  2019 [place].  Spinal Anesthesia and Epidural Anesthesia, Care After This sheet gives you information about how to care for yourself after your procedure. Your doctor may also give you more specific instructions. If you have problems or questions, call your doctor. Follow these instructions at home: For at least 24 hours after the procedure:   Have a responsible adult stay with you. It is important to have someone help care for you until you are awake and alert.  Rest as needed.  Do not do activities where you could fall or get hurt (injured).  Do not drive.  Do not use heavy machinery.  Do not drink alcohol.  Do not take sleeping pills or medicines that make you sleepy (drowsy).  Do not make important decisions.  Do not sign legal documents.  Do not take care of children on your own. Eating and drinking  If you throw up (vomit), drink water, juice, or soup when nausea and vomiting stop.  Drink enough fluid to keep your pee (urine) pale yellow.  Make sure you do not feel like throwing up (nauseous) before you eat solid foods.  Follow the diet that your doctor recommends. General instructions  Return to your normal activities as told by your doctor. Ask your doctor what activities are safe for you.  Take over-the-counter and prescription medicines only as told by your doctor.  If you have sleep apnea, surgery and certain medicines can raise your risk for breathing problems. Follow instructions from your doctor about when to wear your sleep device. Your doctor may tell you to wear your sleep device: ? Anytime you are sleeping, including during daytime naps. ? While taking prescription pain medicines, sleeping pills, or medicines that make you sleepy.  Do not use any products that contain nicotine or tobacco. This includes cigarettes and  e-cigarettes. ? If you need help quitting, ask your doctor. ? If you smoke, do not smoke by yourself. Make sure someone is nearby in case you need help.  Keep all follow-up visits as told by your doctor. This is important. Contact a doctor if:  It  has been more than one day since your procedure and you feel like throwing up.  It has been more than one day since your procedure and you throw up.  You have a rash. Get help right away if:  You have a fever.  You have a headache that lasts a long time.  You have a very bad headache.  Your vision is blurry.  You see two of a single object (double vision).  You are dizzy or light-headed.  You faint.  Your arms or legs tingle, feel weak, or get numb.  You have trouble breathing.  You cannot pee (urinate). Summary  After the procedure, have a responsible adult stay with you at home until you are fully awake and alert.  Do not do activities that might get you injured. Do not drive, use heavy machinery, drink alcohol, or make important decisions for 24 hours after the procedure.  Take medicines as told by your doctor. Do not use products that contain nicotine or tobacco.  Get help right away if you have a fever, blurry vision, difficulty breathing or passing urine, or weakness or numbness in arms or legs. This information is not intended to replace advice given to you by your health care provider. Make sure you discuss any questions you have with your health care provider. Document Released: 07/17/2015 Document Revised: 11/06/2016 Document Reviewed: 07/17/2015 Elsevier Interactive Patient Education  2019 [place] Anesthesia, Adult, Care After This sheet gives you information about how to care for yourself after your procedure. Your health care provider may also give you more specific instructions. If you have problems or questions, contact your health care provider. What can I expect after the procedure? After the  procedure, the following side effects are common:  Pain or discomfort at the IV site.  Nausea.  Vomiting.  Sore throat.  Trouble concentrating.  Feeling cold or chills.  Weak or tired.  Sleepiness and fatigue.  Soreness and body aches. These side effects can affect parts of the body that were not involved in surgery. Follow these instructions at home:  For at least 24 hours after the procedure:  Have a responsible adult stay with you. It is important to have someone help care for you until you are awake and alert.  Rest as needed.  Do not: ? Participate in activities in which you could fall or become injured. ? Drive. ? Use heavy machinery. ? Drink alcohol. ? Take sleeping pills or medicines that cause drowsiness. ? Make important decisions or sign legal documents. ? Take care of children on your own. Eating and drinking  Follow any instructions from your health care provider about eating or drinking restrictions.  When you feel hungry, start by eating small amounts of foods that are soft and easy to digest (bland), such as toast. Gradually return to your regular diet.  Drink enough fluid to keep your urine pale yellow.  If you vomit, rehydrate by drinking water, juice, or clear broth. General instructions  If you have sleep apnea, surgery and certain medicines can increase your risk for breathing problems. Follow instructions from your health care provider about wearing your sleep device: ? Anytime you are sleeping, including during daytime naps. ? While taking prescription pain medicines, sleeping medicines, or medicines that make you drowsy.  Return to your normal activities as told by your health care provider. Ask your health care provider what activities are safe for you.  Take over-the-counter and prescription medicines only as told by  your health care provider.  If you smoke, do not smoke without supervision.  Keep all follow-up visits as told by your  health care provider. This is important. Contact a health care provider if:  You have nausea or vomiting that does not get better with medicine.  You cannot eat or drink without vomiting.  You have pain that does not get better with medicine.  You are unable to pass urine.  You develop a skin rash.  You have a fever.  You have redness around your IV site that gets worse. Get help right away if:  You have difficulty breathing.  You have chest pain.  You have blood in your urine or stool, or you vomit blood. Summary  After the procedure, it is common to have a sore throat or nausea. It is also common to feel tired.  Have a responsible adult stay with you for the first 24 hours after general anesthesia. It is important to have someone help care for you until you are awake and alert.  When you feel hungry, start by eating small amounts of foods that are soft and easy to digest (bland), such as toast. Gradually return to your regular diet.  Drink enough fluid to keep your urine pale yellow.  Return to your normal activities as told by your health care provider. Ask your health care provider what activities are safe for you. This information is not intended to replace advice given to you by your health care provider. Make sure you discuss any questions you have with your health care provider. Document Released: 07/01/2000 Document Revised: 11/08/2016 Document Reviewed: 11/08/2016 Elsevier Interactive Patient Education  2019 [place].    How to Use Chlorhexidine Before Surgery Chlorhexidine gluconate (CHG) is a germ-killing (antiseptic) solution that is used to clean the skin. It gets rid of the bacteria that normally live on the skin. Cleaning your skin with CHG before surgery helps lower the risk for infection after surgery. To clean your skin before surgery, you may be given:  A CHG solution to use in the shower.  A prepackaged cloth that contains CHG. What are the  risks? Risks of using CHG include:  A skin reaction.  Hearing loss, if CHG gets in your ears.  Eye injury, if CHG gets in your eyes and is not rinsed out.  The CHG product catching fire. Make sure that you avoid smoking and flames after applying CHG to your skin. Do not use CHG:  If you have a chlorhexidine allergy or have previously reacted to chlorhexidine.  On babies younger than 30 months of age. How to use CHG solution   Use CHG only as told by your health care provider, and follow the instructions on the label.  Use CHG solution while taking a shower. Follow these steps when using CHG solution (unless your health care provider gives you different instructions): 1. Start the shower. 2. Use your normal soap and shampoo to wash your face and hair. 3. Turn off the shower or move out of the shower stream. 4. Pour the CHG onto a clean washcloth. Do not use any type of brush or rough-edged sponge. 5. Starting at your neck, lather your body down to your toes. Make sure you:  Pay special attention to the part of your body where you will be having surgery. Scrub this area for at least 1 minute.  Use the full amount of CHG as directed. Usually, this is one bottle.  Do not use CHG  on your head or face. If the solution gets into your ears or eyes, rinse them well with water.  Avoid your genital area.  Avoid any areas of skin that have broken skin, cuts, or scrapes.  Scrub your back and under your arms. Make sure to wash skin folds. 6. Let the lather sit on your skin for 1-2 minutes or as long as told by your health care provider. 7. Thoroughly rinse your entire body in the shower. Make sure that all body creases and crevices are rinsed well. 8. Dry off with a clean towel. Do not put any substances on your body afterward, such as powder, lotion, or perfume. 9. Put on clean clothes or pajamas. 10. If it is the night before your surgery, sleep in clean sheets. How to use CHG  prepackaged cloths   Only use CHG cloths as told by your health care provider, and follow the instructions on the label.  Use the CHG cloth on clean, dry skin. Follow these steps when using a CHG cloth (unless your health care provider gives you different instructions): 1. Using the CHG cloth, vigorously scrub the part of your body where you will be having surgery. Scrub using a back-and-forth motion for 3 minutes. The area on your body should be completely wet with CHG when you are done scrubbing. 2. Do not rinse. Discard the cloth and let the area air-dry for 1 minute. Do not put any substances on your body afterward, such as powder, lotion, or perfume. 3. Put on clean clothes or pajamas. 4. If it is the night before your surgery, sleep in clean sheets. Contact a health care provider if:  Your skin gets irritated after scrubbing.  You have questions about using your solution or cloth. Get help right away if:  Your eyes become very red or swollen.  Your eyes itch badly.  Your skin itches badly and is red or swollen.  Your hearing changes.  You have trouble seeing.  You have swelling or tingling in your mouth or throat.  You have trouble breathing.  You swallow any chlorhexidine. Summary  Chlorhexidine gluconate (CHG) is a germ-killing (antiseptic) solution that is used to clean the skin. Cleaning your skin with CHG before surgery helps lower the risk for infection after surgery.  You may be given CHG to use at home. It may be in a bottle or in a prepackaged cloth to use on your skin. Carefully follow your health care provider's instructions and the instructions on the product label.  Do not use CHG if you have a chlorhexidine allergy.  Contact your health care provider if your skin gets irritated after scrubbing. This information is not intended to replace advice given to you by your health care provider. Make sure you discuss any questions you have with your health care  provider. Document Released: 12/18/2011 Document Revised: 02/20/2017 Document Reviewed: 02/20/2017 Elsevier Interactive Patient Education  2019 [employer].

## 2018-09-11 ENCOUNTER — Other Ambulatory Visit (HOSPITAL_COMMUNITY)
Admission: RE | Admit: 2018-09-11 | Discharge: 2018-09-11 | Disposition: A | Discharge status: Home / Self Care | Payer: Medicare HMO | Source: Ambulatory Visit | Attending: Orthopedic Surgery | Admitting: Orthopedic Surgery

## 2018-09-11 ENCOUNTER — Encounter (HOSPITAL_COMMUNITY): Payer: Self-pay

## 2018-09-11 ENCOUNTER — Encounter (HOSPITAL_COMMUNITY)
Admission: RE | Admit: 2018-09-11 | Discharge: 2018-09-11 | Disposition: A | Discharge status: Home / Self Care | Payer: Medicare HMO | Source: Ambulatory Visit | Attending: Orthopedic Surgery | Admitting: Orthopedic Surgery

## 2018-09-11 ENCOUNTER — Other Ambulatory Visit: Payer: Self-pay

## 2018-09-11 DIAGNOSIS — Z1159 Encounter for screening for other viral diseases: Secondary | ICD-10-CM | POA: Insufficient documentation

## 2018-09-11 DIAGNOSIS — Z01812 Encounter for preprocedural laboratory examination: Secondary | ICD-10-CM | POA: Insufficient documentation

## 2018-09-11 LAB — CBC WITH DIFFERENTIAL/PLATELET
Abs Immature Granulocytes: 0.02 10*3/uL (ref 0.00–0.07)
Basophils Absolute: 0 10*3/uL (ref 0.0–0.1)
Basophils Relative: 0 %
Eosinophils Absolute: 0.1 10*3/uL (ref 0.0–0.5)
Eosinophils Relative: 1 %
HCT: 38.1 % (ref 36.0–46.0)
Hemoglobin: 12.4 g/dL (ref 12.0–15.0)
Immature Granulocytes: 0 %
Lymphocytes Relative: 23 %
Lymphs Abs: 1.2 10*3/uL (ref 0.7–4.0)
MCH: 30.4 pg (ref 26.0–34.0)
MCHC: 32.5 g/dL (ref 30.0–36.0)
MCV: 93.4 fL (ref 80.0–100.0)
Monocytes Absolute: 0.5 10*3/uL (ref 0.1–1.0)
Monocytes Relative: 9 %
Neutro Abs: 3.3 10*3/uL (ref 1.7–7.7)
Neutrophils Relative %: 67 %
Platelets: 286 10*3/uL (ref 150–400)
RBC: 4.08 MIL/uL (ref 3.87–5.11)
RDW: 15 % (ref 11.5–15.5)
WBC: 5.1 10*3/uL (ref 4.0–10.5)
nRBC: 0 % (ref 0.0–0.2)

## 2018-09-11 LAB — BASIC METABOLIC PANEL
Anion gap: 12 (ref 5–15)
BUN: 14 mg/dL (ref 8–23)
CO2: 24 mmol/L (ref 22–32)
Calcium: 9.7 mg/dL (ref 8.9–10.3)
Chloride: 100 mmol/L (ref 98–111)
Creatinine, Ser: 0.86 mg/dL (ref 0.44–1.00)
GFR calc Af Amer: >60 mL/min (ref >60)
GFR calc non Af Amer: >60 mL/min (ref >60)
Glucose, Bld: 104 mg/dL — ABNORMAL HIGH (ref 70–99)
Potassium: 3.5 mmol/L (ref 3.5–5.1)
Sodium: 136 mmol/L (ref 135–145)

## 2018-09-11 LAB — ABO/RH: ABO/RH(D): A POS

## 2018-09-11 LAB — SURGICAL PCR SCREEN
MRSA, PCR: NEGATIVE
Staphylococcus aureus: NEGATIVE

## 2018-09-11 LAB — PREPARE RBC (CROSSMATCH)

## 2018-09-12 LAB — NOVEL CORONAVIRUS, NAA (HOSP ORDER, SEND-OUT TO REF LAB; TAT 18-24 HRS): SARS-CoV-2, NAA: NOT DETECTED

## 2018-09-14 NOTE — H&P (Signed)
PREOP CONSULT/REFERRAL INTRA-OFFICE FROM DR Tyrone Apple         Chief Complaint  Patient presents with  . Knee Pain      Left      73 year old female had a coronary artery bypass graft in 2015 for non-STEMI MI with 2 blockages currently has no chest pain or shortness of breath   She is concerned about care of her husband who had a stroke and she will require family members to come and assist her if she decides to proceed with left total knee.   She complains of over 1 year history of left knee pain primarily over the medial joint with catching slipping and locking sensation.   The pain is dull and it interferes with her walking standing sitting and getting out of a chair   Her pain is become severe at this point and she wishes to proceed with knee replacement.     Review of Systems  Respiratory: Negative for shortness of breath and wheezing.   Cardiovascular: Negative for chest pain.  Musculoskeletal: Negative for back pain.  Neurological: Negative for tingling.  All other systems reviewed and are negative.           Past Medical History:  Diagnosis Date  . Anxiety    . Basal cell cancer      left temple  . Coronary artery disease    . Depression    . Headache    . Hepatitis 1972    ? unknown type  . HTN (hypertension)    . Hx of cataract surgery 09/11/2015  . Hypercholesteremia    . OA (osteoarthritis)    . Otitis media, chronic    . Ulcerative colitis 2001    Last colonoscopy Dr [name] 12/28/09-> pan-UC, normal TI, maintained on Asacol and Imuran           Past Surgical History:  Procedure Laterality Date  . BIOPSY   11/26/2017    Procedure: BIOPSY;  Surgeon: Daneil Dolin, MD;  Location: [facility];  Service: Endoscopy;;  biopsies of normal terminal, ascending, transverse, descending, sigmoid, rectal  . CARDIAC CATHETERIZATION      . COLONOSCOPY   12/28/09    Dr. [name] colitis  . COLONOSCOPY N/A 02/01/2016    Procedure: COLONOSCOPY;  Surgeon:  Daneil Dolin, MD;  Location: [facility];  Service: Endoscopy;  Laterality: N/A;  1015  . COLONOSCOPY N/A 11/26/2017    Dr. [name]: diffuse granular/pale colonic mucosa. couple of tiny erosions in ascending and descending segment. rectum less involved but not completely normal. multiple erosions in TI. segmental bxs with mildly active inflammation in TI, sigmoid colon, inactive chronic proctitis, colitis in descending colon. . next tcs 2 years  . CORONARY ARTERY BYPASS GRAFT N/A 02/25/2014    Procedure: CORONARY ARTERY BYPASS GRAFTING (CABG);  Surgeon: Ivin Poot, MD;  Location: [facility];  Service: Open Heart Surgery;  Laterality: N/A;  . EYE SURGERY      . INTRAOPERATIVE TRANSESOPHAGEAL ECHOCARDIOGRAM N/A 02/25/2014    Procedure: INTRAOPERATIVE TRANSESOPHAGEAL ECHOCARDIOGRAM;  Surgeon: Ivin Poot, MD;  Location: [facility];  Service: Open Heart Surgery;  Laterality: N/A;  . KNEE SURGERY Right    . LEFT HEART CATHETERIZATION WITH CORONARY ANGIOGRAM N/A 02/23/2014    Procedure: LEFT HEART CATHETERIZATION WITH CORONARY ANGIOGRAM;  Surgeon: Burnell Blanks, MD;  Location: [facility] CATH LAB;  Service: Cardiovascular;  Laterality: N/A;  . SALPINGOOPHORECTOMY        right ovary,  both tubes  . TONSILLECTOMY               Family History  Problem Relation Age of Onset  . Skin cancer Father    . Heart failure Mother    . HIV Brother    . Lung cancer Brother      Social History         Tobacco Use  . Smoking status: Current Some Day Smoker      Packs/day: 1.00      Years: 30.00      Pack years: 30.00      Types: Cigarettes      Last attempt to quit: 02/22/2014      Years since quitting: 4.1  . Smokeless tobacco: Never Used  . Tobacco comment: smokes about 3 cigarettes per week, formerly heavy smoker  Substance Use Topics  . Alcohol use: No      Alcohol/week: 0.0 standard drinks  . Drug use: No           Allergies  Allergen Reactions  . Statins Other (See Comments)      Muscle  aches  . Aspirin          Active Medications      Current Meds  Medication Sig  . acetaminophen (TYLENOL) 500 MG tablet Take 500-1,000 mg by mouth every 6 (six) hours as needed for mild pain or headache.   . ALPRAZolam (XANAX) 0.5 MG tablet Take 0.5 mg by mouth daily as needed for anxiety.  Marland Kitchen aspirin EC 81 MG tablet Take 1 tablet (81 mg total) by mouth daily.  Marland Kitchen azaTHIOprine (IMURAN) 50 MG tablet TAKE 3 TABLETS (150 MG TOTAL) BY MOUTH DAILY.  Marland Kitchen CALCIUM PO Take by mouth daily.  . cholecalciferol (VITAMIN D) 1000 UNITS tablet Take 1,000 Units by mouth daily.  . hydrochlorothiazide (MICROZIDE) 12.5 MG capsule Take 12.5 mg by mouth daily.  . Multiple Vitamin (MULTIVITAMIN WITH MINERALS) TABS tablet Take 1 tablet by mouth daily.  . nitroGLYCERIN (NITROSTAT) 0.4 MG SL tablet Place 1 tablet (0.4 mg total) under the tongue every 5 (five) minutes as needed for chest pain.  . Probiotic Product (PROBIOTIC FORMULA PO) Take 1 tablet by mouth every evening.         BP (!) 142/69   Pulse 76   Ht 5' 6"  (1.676 m)   Wt 207 lb (93.9 kg)   BMI 33.41 kg/m    Physical Exam    Constitutional exam vital signs are stable appearance is normal in terms of development nutrition body habitus no deformities well-groomed   Peripheral vascular system observe no swelling or varicose veins palpated normal temperature without edema or tenderness   Ambulatory status normal with a slight antalgic gait on the left side   No lymphadenopathy left lower extremity or right lower extremity   Right Knee Exam    Muscle Strength  The patient has normal right knee strength.   Tenderness  The patient is experiencing tenderness in the medial joint line.   Range of Motion  Extension: normal  Flexion: 130    Tests  McMurray:  Medial - negative Lateral - negative Varus: negative Valgus: negative Drawer:  Anterior - negative    Posterior - negative   Other  Erythema: absent Scars: absent Sensation: normal  Pulse: present Swelling: none     Left Knee Exam    Muscle Strength  The patient has normal left knee strength.   Tenderness  The patient is experiencing  tenderness in the medial joint line.   Range of Motion  Extension: normal  Flexion: 130    Tests  McMurray:  Medial - negative Lateral - negative Varus: negative Valgus: negative Drawer:  Anterior - negative     Posterior - negative   Other  Erythema: absent Scars: absent Sensation: normal Pulse: present Swelling: none           MEDICAL DECISION SECTION  xrays ordered? NO    Films were previously taken in the office   Left knee   My independent reading of xrays: Mild varus of the left knee.  Bone quality normal.  Joint space narrowing severe medial side.  Secondary bone changes are noted as well.   Right knee also arthritic with varus deformity joint space narrowing spurs cysts and sclerosis         Encounter Diagnoses  Name Primary?  . Primary localized osteoarthritis of knee left Yes  . Chronic pain of both knees          PLAN:    Surgical procedure planned: Left total knee arthroplasty   The procedure has been fully reviewed with the patient; The risks and benefits of surgery have been discussed and explained and understood. Alternative treatment has also been reviewed, questions were encouraged and answered. The postoperative plan is also been reviewed.   Nonsurgical treatment as described in the history and physical section was attempted and unsuccessful and the patient has agreed to proceed with surgical intervention to improve their situation.   Arther Abbott, MD 09/14/2018 12:13 PM

## 2018-09-15 ENCOUNTER — Inpatient Hospital Stay (HOSPITAL_COMMUNITY): Payer: Medicare HMO | Admitting: Anesthesiology

## 2018-09-15 ENCOUNTER — Encounter (HOSPITAL_COMMUNITY): Payer: Self-pay | Admitting: *Deleted

## 2018-09-15 ENCOUNTER — Encounter (HOSPITAL_COMMUNITY)
Admission: RE | Disposition: A | Discharge status: Home / Self Care | Payer: Self-pay | Source: Home / Self Care | Attending: Orthopedic Surgery

## 2018-09-15 ENCOUNTER — Other Ambulatory Visit: Payer: Self-pay

## 2018-09-15 ENCOUNTER — Inpatient Hospital Stay (HOSPITAL_COMMUNITY): Payer: Medicare HMO

## 2018-09-15 ENCOUNTER — Observation Stay (HOSPITAL_COMMUNITY)
Admission: RE | Admit: 2018-09-15 | Discharge: 2018-09-18 | Disposition: A | Discharge status: Home / Self Care | Payer: Medicare HMO | Attending: Orthopedic Surgery | Admitting: Orthopedic Surgery

## 2018-09-15 DIAGNOSIS — Z7982 Long term (current) use of aspirin: Secondary | ICD-10-CM | POA: Diagnosis not present

## 2018-09-15 DIAGNOSIS — I10 Essential (primary) hypertension: Secondary | ICD-10-CM | POA: Diagnosis not present

## 2018-09-15 DIAGNOSIS — I2510 Atherosclerotic heart disease of native coronary artery without angina pectoris: Secondary | ICD-10-CM | POA: Diagnosis not present

## 2018-09-15 DIAGNOSIS — I252 Old myocardial infarction: Secondary | ICD-10-CM | POA: Insufficient documentation

## 2018-09-15 DIAGNOSIS — G8929 Other chronic pain: Secondary | ICD-10-CM | POA: Insufficient documentation

## 2018-09-15 DIAGNOSIS — F17210 Nicotine dependence, cigarettes, uncomplicated: Secondary | ICD-10-CM | POA: Insufficient documentation

## 2018-09-15 DIAGNOSIS — M1712 Unilateral primary osteoarthritis, left knee: Secondary | ICD-10-CM | POA: Diagnosis present

## 2018-09-15 DIAGNOSIS — Z8711 Personal history of peptic ulcer disease: Secondary | ICD-10-CM | POA: Diagnosis not present

## 2018-09-15 DIAGNOSIS — Z888 Allergy status to other drugs, medicaments and biological substances status: Secondary | ICD-10-CM | POA: Diagnosis not present

## 2018-09-15 DIAGNOSIS — Z886 Allergy status to analgesic agent status: Secondary | ICD-10-CM | POA: Diagnosis not present

## 2018-09-15 DIAGNOSIS — Z951 Presence of aortocoronary bypass graft: Secondary | ICD-10-CM | POA: Insufficient documentation

## 2018-09-15 DIAGNOSIS — Z96652 Presence of left artificial knee joint: Secondary | ICD-10-CM

## 2018-09-15 DIAGNOSIS — Z79899 Other long term (current) drug therapy: Secondary | ICD-10-CM | POA: Diagnosis not present

## 2018-09-15 DIAGNOSIS — M21162 Varus deformity, not elsewhere classified, left knee: Secondary | ICD-10-CM | POA: Insufficient documentation

## 2018-09-15 DIAGNOSIS — F419 Anxiety disorder, unspecified: Secondary | ICD-10-CM | POA: Diagnosis not present

## 2018-09-15 HISTORY — PX: TOTAL KNEE ARTHROPLASTY: SHX125

## 2018-09-15 SURGERY — ARTHROPLASTY, KNEE, TOTAL
Anesthesia: Spinal | Site: Knee | Laterality: Left

## 2018-09-15 MED ORDER — ALPRAZOLAM 0.5 MG PO TABS: 0.5000 mg | ORAL_TABLET | Freq: Every day | ORAL | Status: DC | PRN

## 2018-09-15 MED ORDER — MORPHINE SULFATE (PF) 2 MG/ML IV SOLN
0.5000 mg | INTRAVENOUS | Status: DC | PRN
  Administered 2018-09-15 (×2): 1 mg via INTRAVENOUS
  Filled 2018-09-15 (×2): qty 1

## 2018-09-15 MED ORDER — MENTHOL 3 MG MT LOZG: 1.0000 | LOZENGE | OROMUCOSAL | Status: DC | PRN

## 2018-09-15 MED ORDER — TRANEXAMIC ACID-NACL 1000-0.7 MG/100ML-% IV SOLN
1000.0000 mg | Freq: Once | INTRAVENOUS | Status: AC
  Administered 2018-09-15: 1000 mg via INTRAVENOUS
  Filled 2018-09-15: qty 100

## 2018-09-15 MED ORDER — ROCURONIUM BROMIDE 10 MG/ML (PF) SYRINGE
PREFILLED_SYRINGE | INTRAVENOUS | Status: AC
  Filled 2018-09-15: qty 10

## 2018-09-15 MED ORDER — VITAMIN C 500 MG PO TABS
500.0000 mg | ORAL_TABLET | ORAL | Status: DC
  Administered 2018-09-15 – 2018-09-17 (×2): 500 mg via ORAL
  Filled 2018-09-15 (×4): qty 1

## 2018-09-15 MED ORDER — BUPIVACAINE LIPOSOME 1.3 % IJ SUSP
INTRAMUSCULAR | Status: AC
  Filled 2018-09-15: qty 20

## 2018-09-15 MED ORDER — ONDANSETRON HCL 4 MG/2ML IJ SOLN
4.0000 mg | Freq: Once | INTRAMUSCULAR | Status: AC
  Administered 2018-09-15: 4 mg via INTRAVENOUS
  Filled 2018-09-15: qty 2

## 2018-09-15 MED ORDER — ONDANSETRON HCL 4 MG PO TABS: 4.0000 mg | ORAL_TABLET | Freq: Four times a day (QID) | ORAL | Status: DC | PRN

## 2018-09-15 MED ORDER — CELECOXIB 100 MG PO CAPS
200.0000 mg | ORAL_CAPSULE | Freq: Two times a day (BID) | ORAL | Status: DC
  Administered 2018-09-15 – 2018-09-18 (×6): 200 mg via ORAL
  Filled 2018-09-15 (×6): qty 2

## 2018-09-15 MED ORDER — 0.9 % SODIUM CHLORIDE (POUR BTL) OPTIME
TOPICAL | Status: DC | PRN
  Administered 2018-09-15: 1000 mL

## 2018-09-15 MED ORDER — BUPIVACAINE HCL (PF) 0.75 % IJ SOLN
INTRAMUSCULAR | Status: DC | PRN
  Administered 2018-09-15: 15 mg via INTRATHECAL

## 2018-09-15 MED ORDER — MIDAZOLAM HCL 2 MG/2ML IJ SOLN: 0.5000 mg | Freq: Once | INTRAMUSCULAR | Status: DC | PRN

## 2018-09-15 MED ORDER — VITAMIN D 25 MCG (1000 UNIT) PO TABS
1000.0000 [IU] | ORAL_TABLET | Freq: Every day | ORAL | Status: DC
  Administered 2018-09-15 – 2018-09-18 (×4): 1000 [IU] via ORAL
  Filled 2018-09-15 (×4): qty 1

## 2018-09-15 MED ORDER — LIDOCAINE 2% (20 MG/ML) 5 ML SYRINGE
INTRAMUSCULAR | Status: AC
  Filled 2018-09-15: qty 5

## 2018-09-15 MED ORDER — NITROGLYCERIN 0.4 MG SL SUBL: 0.4000 mg | SUBLINGUAL_TABLET | SUBLINGUAL | Status: DC | PRN

## 2018-09-15 MED ORDER — HYDROMORPHONE HCL 1 MG/ML IJ SOLN
0.2500 mg | INTRAMUSCULAR | Status: DC | PRN
  Administered 2018-09-15 (×3): 0.5 mg via INTRAVENOUS
  Filled 2018-09-15 (×3): qty 0.5

## 2018-09-15 MED ORDER — BUPIVACAINE IN DEXTROSE 0.75-8.25 % IT SOLN
INTRATHECAL | Status: AC
  Filled 2018-09-15: qty 2

## 2018-09-15 MED ORDER — RISAQUAD PO CAPS
1.0000 | ORAL_CAPSULE | Freq: Every evening | ORAL | Status: DC
  Administered 2018-09-15 – 2018-09-17 (×3): 1 via ORAL
  Filled 2018-09-15 (×3): qty 1

## 2018-09-15 MED ORDER — SODIUM CHLORIDE 0.9 % IV SOLN
INTRAVENOUS | Status: DC | PRN
  Administered 2018-09-15: 09:00:00 40 mL

## 2018-09-15 MED ORDER — METHOCARBAMOL 1000 MG/10ML IJ SOLN: 500.0000 mg | Freq: Four times a day (QID) | INTRAVENOUS | Status: DC | PRN

## 2018-09-15 MED ORDER — SUCCINYLCHOLINE CHLORIDE 200 MG/10ML IV SOSY
PREFILLED_SYRINGE | INTRAVENOUS | Status: AC
  Filled 2018-09-15: qty 10

## 2018-09-15 MED ORDER — DOCUSATE SODIUM 100 MG PO CAPS
100.0000 mg | ORAL_CAPSULE | Freq: Two times a day (BID) | ORAL | Status: DC
  Administered 2018-09-15 – 2018-09-18 (×7): 100 mg via ORAL
  Filled 2018-09-15 (×7): qty 1

## 2018-09-15 MED ORDER — BUPIVACAINE-EPINEPHRINE (PF) 0.5% -1:200000 IJ SOLN
INTRAMUSCULAR | Status: DC | PRN
  Administered 2018-09-15: 20 mL via PERINEURAL

## 2018-09-15 MED ORDER — GLYCOPYRROLATE PF 0.2 MG/ML IJ SOSY
PREFILLED_SYRINGE | INTRAMUSCULAR | Status: AC
  Filled 2018-09-15: qty 1

## 2018-09-15 MED ORDER — DEXAMETHASONE SODIUM PHOSPHATE 10 MG/ML IJ SOLN
10.0000 mg | Freq: Once | INTRAMUSCULAR | Status: AC
  Administered 2018-09-16: 10 mg via INTRAVENOUS
  Filled 2018-09-15: qty 1

## 2018-09-15 MED ORDER — TRAMADOL HCL 50 MG PO TABS
50.0000 mg | ORAL_TABLET | Freq: Four times a day (QID) | ORAL | Status: DC
  Administered 2018-09-15 – 2018-09-18 (×12): 50 mg via ORAL
  Filled 2018-09-15 (×12): qty 1

## 2018-09-15 MED ORDER — CELECOXIB 400 MG PO CAPS
400.0000 mg | ORAL_CAPSULE | Freq: Once | ORAL | Status: AC
  Administered 2018-09-15: 400 mg via ORAL
  Filled 2018-09-15: qty 1

## 2018-09-15 MED ORDER — MIDAZOLAM HCL 5 MG/5ML IJ SOLN
INTRAMUSCULAR | Status: DC | PRN
  Administered 2018-09-15 (×2): 1 mg via INTRAVENOUS

## 2018-09-15 MED ORDER — VITAMIN B-12 1000 MCG PO TABS
2500.0000 ug | ORAL_TABLET | Freq: Every day | ORAL | Status: DC
  Administered 2018-09-15 – 2018-09-18 (×4): 2500 ug via ORAL
  Filled 2018-09-15 (×4): qty 3

## 2018-09-15 MED ORDER — CALCIUM CARBONATE-VITAMIN D 500-200 MG-UNIT PO TABS
1.0000 | ORAL_TABLET | Freq: Every day | ORAL | Status: DC
  Administered 2018-09-15 – 2018-09-18 (×4): 1 via ORAL
  Filled 2018-09-15 (×4): qty 1

## 2018-09-15 MED ORDER — SODIUM CHLORIDE (PF) 0.9 % IJ SOLN
INTRAMUSCULAR | Status: AC
  Filled 2018-09-15: qty 40

## 2018-09-15 MED ORDER — LACTATED RINGERS IV SOLN: INTRAVENOUS | Status: DC

## 2018-09-15 MED ORDER — BUPIVACAINE-EPINEPHRINE (PF) 0.5% -1:200000 IJ SOLN
INTRAMUSCULAR | Status: AC
  Filled 2018-09-15: qty 30

## 2018-09-15 MED ORDER — ALUM & MAG HYDROXIDE-SIMETH 200-200-20 MG/5ML PO SUSP: 30.0000 mL | ORAL | Status: DC | PRN

## 2018-09-15 MED ORDER — DIPHENHYDRAMINE HCL 12.5 MG/5ML PO ELIX: 12.5000 mg | ORAL_SOLUTION | ORAL | Status: DC | PRN

## 2018-09-15 MED ORDER — SODIUM CHLORIDE 0.9 % IV SOLN
INTRAVENOUS | Status: AC
  Administered 2018-09-15: 11:00:00 via INTRAVENOUS

## 2018-09-15 MED ORDER — METHOCARBAMOL 500 MG PO TABS: 500.0000 mg | ORAL_TABLET | Freq: Four times a day (QID) | ORAL | Status: DC | PRN

## 2018-09-15 MED ORDER — ONDANSETRON HCL 4 MG/2ML IJ SOLN: 4.0000 mg | Freq: Four times a day (QID) | INTRAMUSCULAR | Status: DC | PRN

## 2018-09-15 MED ORDER — ADULT MULTIVITAMIN W/MINERALS CH
1.0000 | ORAL_TABLET | Freq: Every day | ORAL | Status: DC
  Administered 2018-09-15 – 2018-09-18 (×4): 1 via ORAL
  Filled 2018-09-15 (×4): qty 1

## 2018-09-15 MED ORDER — PROPOFOL 10 MG/ML IV BOLUS
INTRAVENOUS | Status: AC
  Filled 2018-09-15: qty 40

## 2018-09-15 MED ORDER — MIDAZOLAM HCL 2 MG/2ML IJ SOLN
INTRAMUSCULAR | Status: AC
  Filled 2018-09-15: qty 2

## 2018-09-15 MED ORDER — GABAPENTIN 300 MG PO CAPS
300.0000 mg | ORAL_CAPSULE | Freq: Three times a day (TID) | ORAL | Status: DC
  Administered 2018-09-15 – 2018-09-18 (×9): 300 mg via ORAL
  Filled 2018-09-15 (×9): qty 1

## 2018-09-15 MED ORDER — METOCLOPRAMIDE HCL 10 MG PO TABS: 5.0000 mg | ORAL_TABLET | Freq: Three times a day (TID) | ORAL | Status: DC | PRN

## 2018-09-15 MED ORDER — MAGNESIUM OXIDE 400 (241.3 MG) MG PO TABS
400.0000 mg | ORAL_TABLET | ORAL | Status: DC
  Administered 2018-09-15 – 2018-09-17 (×2): 400 mg via ORAL
  Filled 2018-09-15 (×4): qty 1

## 2018-09-15 MED ORDER — HYDROCHLOROTHIAZIDE 12.5 MG PO CAPS
12.5000 mg | ORAL_CAPSULE | Freq: Every day | ORAL | Status: DC
  Administered 2018-09-15 – 2018-09-18 (×4): 12.5 mg via ORAL
  Filled 2018-09-15 (×4): qty 1

## 2018-09-15 MED ORDER — CEFAZOLIN SODIUM-DEXTROSE 2-4 GM/100ML-% IV SOLN
2.0000 g | INTRAVENOUS | Status: AC
  Administered 2018-09-15: 2 g via INTRAVENOUS
  Filled 2018-09-15: qty 100

## 2018-09-15 MED ORDER — ACETAMINOPHEN 500 MG PO TABS
500.0000 mg | ORAL_TABLET | Freq: Four times a day (QID) | ORAL | Status: AC
  Administered 2018-09-15 – 2018-09-16 (×4): 500 mg via ORAL
  Filled 2018-09-15 (×4): qty 1

## 2018-09-15 MED ORDER — FENTANYL CITRATE (PF) 250 MCG/5ML IJ SOLN
INTRAMUSCULAR | Status: AC
  Filled 2018-09-15: qty 5

## 2018-09-15 MED ORDER — METOCLOPRAMIDE HCL 5 MG/ML IJ SOLN
5.0000 mg | Freq: Three times a day (TID) | INTRAMUSCULAR | Status: DC | PRN
  Administered 2018-09-15: 10 mg via INTRAVENOUS
  Filled 2018-09-15: qty 2

## 2018-09-15 MED ORDER — PHENOL 1.4 % MT LIQD: 1.0000 | OROMUCOSAL | Status: DC | PRN

## 2018-09-15 MED ORDER — ASPIRIN EC 81 MG PO TBEC
81.0000 mg | DELAYED_RELEASE_TABLET | Freq: Every day | ORAL | Status: DC
  Administered 2018-09-15 – 2018-09-18 (×4): 81 mg via ORAL
  Filled 2018-09-15 (×4): qty 1

## 2018-09-15 MED ORDER — PROMETHAZINE HCL 25 MG/ML IJ SOLN: 6.2500 mg | INTRAMUSCULAR | Status: DC | PRN

## 2018-09-15 MED ORDER — POLYETHYLENE GLYCOL 3350 17 G PO PACK
17.0000 g | PACK | Freq: Every day | ORAL | Status: DC
  Administered 2018-09-15 – 2018-09-18 (×4): 17 g via ORAL
  Filled 2018-09-15 (×4): qty 1

## 2018-09-15 MED ORDER — HYDROCODONE-ACETAMINOPHEN 7.5-325 MG PO TABS
1.0000 | ORAL_TABLET | Freq: Once | ORAL | Status: AC | PRN
  Administered 2018-09-15: 1 via ORAL
  Filled 2018-09-15: qty 1

## 2018-09-15 MED ORDER — CHLORHEXIDINE GLUCONATE 4 % EX LIQD: 60.0000 mL | Freq: Once | CUTANEOUS | Status: DC

## 2018-09-15 MED ORDER — CEFAZOLIN SODIUM-DEXTROSE 2-4 GM/100ML-% IV SOLN
2.0000 g | Freq: Four times a day (QID) | INTRAVENOUS | Status: AC
  Administered 2018-09-15 (×2): 2 g via INTRAVENOUS
  Filled 2018-09-15 (×2): qty 100

## 2018-09-15 MED ORDER — HYDROCODONE-ACETAMINOPHEN 7.5-325 MG PO TABS
1.0000 | ORAL_TABLET | ORAL | Status: DC | PRN
  Administered 2018-09-15: 2 via ORAL
  Administered 2018-09-16 – 2018-09-17 (×2): 1 via ORAL
  Administered 2018-09-18: 2 via ORAL
  Filled 2018-09-15 (×2): qty 2
  Filled 2018-09-15 (×2): qty 1

## 2018-09-15 MED ORDER — METHOCARBAMOL 1000 MG/10ML IJ SOLN
500.0000 mg | Freq: Once | INTRAVENOUS | Status: AC
  Administered 2018-09-15: 500 mg via INTRAVENOUS
  Filled 2018-09-15: qty 5

## 2018-09-15 MED ORDER — PREGABALIN 50 MG PO CAPS
50.0000 mg | ORAL_CAPSULE | Freq: Once | ORAL | Status: DC
  Filled 2018-09-15: qty 1

## 2018-09-15 MED ORDER — PROPOFOL 500 MG/50ML IV EMUL
INTRAVENOUS | Status: DC | PRN
  Administered 2018-09-15: 15 ug/kg/min via INTRAVENOUS

## 2018-09-15 MED ORDER — LACTATED RINGERS IV SOLN
INTRAVENOUS | Status: DC | PRN
  Administered 2018-09-15: 07:00:00 via INTRAVENOUS

## 2018-09-15 MED ORDER — FENTANYL CITRATE (PF) 100 MCG/2ML IJ SOLN
INTRAMUSCULAR | Status: DC | PRN
  Administered 2018-09-15: 25 ug via INTRAVENOUS

## 2018-09-15 MED ORDER — METOPROLOL TARTRATE 25 MG PO TABS
12.5000 mg | ORAL_TABLET | Freq: Two times a day (BID) | ORAL | Status: DC
  Administered 2018-09-15 – 2018-09-18 (×7): 12.5 mg via ORAL
  Filled 2018-09-15 (×7): qty 1

## 2018-09-15 MED ORDER — SODIUM CHLORIDE 0.9 % IR SOLN
Status: DC | PRN
  Administered 2018-09-15: 3000 mL

## 2018-09-15 MED ORDER — AZATHIOPRINE 50 MG PO TABS
150.0000 mg | ORAL_TABLET | ORAL | Status: DC
  Administered 2018-09-16 – 2018-09-18 (×3): 150 mg via ORAL
  Filled 2018-09-15 (×6): qty 3

## 2018-09-15 SURGICAL SUPPLY — 70 items
BANDAGE ELASTIC 4 LF NS (GAUZE/BANDAGES/DRESSINGS) ×4 IMPLANT
BANDAGE ELASTIC 6 LF NS (GAUZE/BANDAGES/DRESSINGS) ×2 IMPLANT
BANDAGE ESMARK 6X9 LF (GAUZE/BANDAGES/DRESSINGS) ×1 IMPLANT
BLADE HEX COATED 2.75 (ELECTRODE) ×2 IMPLANT
BLADE SAGITTAL 25.0X1.27X90 (BLADE) ×2 IMPLANT
BNDG CMPR 9X6 STRL LF SNTH (GAUZE/BANDAGES/DRESSINGS) ×1
BNDG CMPR MED 5X4 ELC HKLP NS (GAUZE/BANDAGES/DRESSINGS) ×2
BNDG CMPR MED 5X6 ELC HKLP NS (GAUZE/BANDAGES/DRESSINGS) ×1
BNDG ESMARK 6X9 LF (GAUZE/BANDAGES/DRESSINGS) ×2
CEMENT HV SMART SET (Cement) ×4 IMPLANT
CLOTH BEACON ORANGE TIMEOUT ST (SAFETY) ×2 IMPLANT
COOLER CRYO CUFF IC AND MOTOR (MISCELLANEOUS) ×2 IMPLANT
COVER LIGHT HANDLE STERIS (MISCELLANEOUS) ×4 IMPLANT
COVER MAYO STAND XLG (DRAPE) ×2 IMPLANT
COVER WAND RF STERILE (DRAPES) ×2 IMPLANT
CUFF CRYO KNEE18X23 MED (MISCELLANEOUS) ×2 IMPLANT
CUFF TOURNIQUET SINGLE 34IN LL (TOURNIQUET CUFF) ×2 IMPLANT
DECANTER SPIKE VIAL GLASS SM (MISCELLANEOUS) ×2 IMPLANT
DRAPE BACK TABLE (DRAPES) ×2 IMPLANT
DRAPE EXTREMITY T 121X128X90 (DISPOSABLE) ×2 IMPLANT
DRSG MEPILEX BORDER 4X12 (GAUZE/BANDAGES/DRESSINGS) ×2 IMPLANT
DURAPREP 26ML APPLICATOR (WOUND CARE) ×4 IMPLANT
ELECT REM PT RETURN 9FT ADLT (ELECTROSURGICAL) ×2
ELECTRODE REM PT RTRN 9FT ADLT (ELECTROSURGICAL) ×1 IMPLANT
FEMUR CMTD LEFT SZ 5 (Knees) ×1 IMPLANT
FEMUR LEFT SZ 5 (Knees) ×2 IMPLANT
GLOVE BIO SURGEON STRL SZ7 (GLOVE) ×4 IMPLANT
GLOVE BIOGEL PI IND STRL 7.0 (GLOVE) ×4 IMPLANT
GLOVE BIOGEL PI INDICATOR 7.0 (GLOVE) ×4
GLOVE SKINSENSE NS SZ8.0 LF (GLOVE) ×2
GLOVE SKINSENSE STRL SZ8.0 LF (GLOVE) ×2 IMPLANT
GLOVE SS N UNI LF 8.5 STRL (GLOVE) ×2 IMPLANT
GOWN STRL REUS W/ TWL LRG LVL3 (GOWN DISPOSABLE) ×1 IMPLANT
GOWN STRL REUS W/TWL LRG LVL3 (GOWN DISPOSABLE) ×4 IMPLANT
GOWN STRL REUS W/TWL XL LVL3 (GOWN DISPOSABLE) ×2 IMPLANT
HANDPIECE INTERPULSE COAX TIP (DISPOSABLE) ×2
HOOD W/PEELAWAY (MISCELLANEOUS) ×6 IMPLANT
INSERT CROSS LINKED SZ 4 10MM (Knees) ×2 IMPLANT
INST SET MAJOR BONE (KITS) ×2 IMPLANT
IV NS IRRIG 3000ML ARTHROMATIC (IV SOLUTION) ×2 IMPLANT
KIT BLADEGUARD II DBL (SET/KITS/TRAYS/PACK) ×2 IMPLANT
KIT TURNOVER CYSTO (KITS) ×2 IMPLANT
MANIFOLD NEPTUNE II (INSTRUMENTS) ×2 IMPLANT
MARKER SKIN DUAL TIP RULER LAB (MISCELLANEOUS) ×2 IMPLANT
NEEDLE HYPO 21X1.5 SAFETY (NEEDLE) ×2 IMPLANT
NS IRRIG 1000ML POUR BTL (IV SOLUTION) ×2 IMPLANT
PACK TOTAL JOINT (CUSTOM PROCEDURE TRAY) ×2 IMPLANT
PAD ARMBOARD 7.5X6 YLW CONV (MISCELLANEOUS) ×2 IMPLANT
PAD DANNIFLEX CPM (ORTHOPEDIC SUPPLIES) ×2 IMPLANT
PATELLA DOME PFC 38MM (Knees) ×2 IMPLANT
PILLOW KNEE EXTENSION 0 DEG (MISCELLANEOUS) ×2 IMPLANT
PIN THREADED HEADED SIGMA (PIN) ×2 IMPLANT
PIN/DRILL PACK ORTHO 1/8X3.0 (PIN) ×2 IMPLANT
SAW OSC TIP CART 19.5X105X1.3 (SAW) ×2 IMPLANT
SET BASIN LINEN APH (SET/KITS/TRAYS/PACK) ×2 IMPLANT
SET HNDPC FAN SPRY TIP SCT (DISPOSABLE) ×1 IMPLANT
STAPLER VISISTAT 35W (STAPLE) ×2 IMPLANT
SUT BRALON NAB BRD #1 30IN (SUTURE) ×4 IMPLANT
SUT MNCRL 0 VIOLET CTX 36 (SUTURE) ×1 IMPLANT
SUT MON AB 0 CT1 (SUTURE) ×4 IMPLANT
SUT MON AB 2-0 CT1 36 (SUTURE) IMPLANT
SUT MONOCRYL 0 CTX 36 (SUTURE) ×1
SYR BULB IRRIGATION 50ML (SYRINGE) ×2 IMPLANT
SYRINGE 20CC LL (MISCELLANEOUS) ×10 IMPLANT
TOWEL OR 17X26 4PK STRL BLUE (TOWEL DISPOSABLE) ×2 IMPLANT
TOWER CARTRIDGE SMART MIX (DISPOSABLE) ×2 IMPLANT
TRAY FOLEY MTR SLVR 16FR STAT (SET/KITS/TRAYS/PACK) ×2 IMPLANT
TRAY TIBIAL MOD SZ 4.0 (Knees) ×2 IMPLANT
WATER STERILE IRR 1000ML POUR (IV SOLUTION) ×4 IMPLANT
YANKAUER SUCT 12FT TUBE ARGYLE (SUCTIONS) ×2 IMPLANT

## 2018-09-15 NOTE — Transfer of Care (Signed)
Immediate Anesthesia Transfer of Care Note  Patient: Bonnie Powers  Procedure(s) Performed: TOTAL KNEE ARTHROPLASTY (Left Knee)  Patient Location: PACU  Anesthesia Type:MAC and Spinal  Level of Consciousness: awake, alert , oriented and patient cooperative  Airway & Oxygen Therapy: Patient Spontanous Breathing  Post-op Assessment: Report given to RN and Post -op Vital signs reviewed and stable  Post vital signs: Reviewed and stable  Last Vitals:  Vitals Value Taken Time  BP    Temp    Pulse    Resp    SpO2      Last Pain:  Vitals:   09/15/18 0702  TempSrc: Oral  PainSc: 0-No pain      Patients Stated Pain Goal: 7 (32/20/25 4270)  Complications: No apparent anesthesia complications

## 2018-09-15 NOTE — Addendum Note (Signed)
Addendum  created 09/15/18 1127 by Mickel Baas, CRNA   Charge Capture section accepted, Intraprocedure Flowsheets edited

## 2018-09-15 NOTE — Plan of Care (Signed)
  Problem: Acute Rehab PT Goals(only PT should resolve) Goal: Pt Will Go Supine/Side To Sit Outcome: Progressing Flowsheets (Taken 09/15/2018 1534) Pt will go Supine/Side to Sit: with supervision Goal: Patient Will Transfer Sit To/From Stand Outcome: Progressing Flowsheets (Taken 09/15/2018 1534) Patient will transfer sit to/from stand: with supervision Goal: Pt Will Transfer Bed To Chair/Chair To Bed Outcome: Progressing Flowsheets (Taken 09/15/2018 1534) Pt will Transfer Bed to Chair/Chair to Bed: with supervision Goal: Pt Will Ambulate Outcome: Progressing Flowsheets (Taken 09/15/2018 1534) Pt will Ambulate: 100 feet; with supervision; with rolling walker   3:34 PM, 09/15/18 Lonell Grandchild, MPT Physical Therapist with [facility] [phone] office [phone] mobile phone

## 2018-09-15 NOTE — Anesthesia Procedure Notes (Signed)
Spinal  Patient location during procedure: OR Start time: 09/15/2018 7:32 AM End time: 09/15/2018 7:37 AM Staffing Anesthesiologist: Lenice Llamas, MD Performed: anesthesiologist  Preanesthetic Checklist Completed: patient identified, site marked, surgical consent, pre-op evaluation, timeout performed, IV checked, risks and benefits discussed and monitors and equipment checked Spinal Block Patient position: sitting Prep: Betadine Patient monitoring: heart rate, cardiac monitor, continuous pulse ox and blood pressure Approach: midline Location: L3-4 Injection technique: single-shot Needle Needle type: Spinocan  Needle gauge: 24 G Needle length: 9 cm Assessment Sensory level: T6 Additional Notes ATTEMPTS:1 TRAY ID: TRAY EXPIRATION DATE: 2cc 0.75 heavy bupiv -good block

## 2018-09-15 NOTE — Brief Op Note (Signed)
09/15/2018  9:48 AM  PATIENT:  [name]  73 y.o. female  PRE-OPERATIVE DIAGNOSIS:  osteoarthritis left knee  POST-OPERATIVE DIAGNOSIS:  osteoarthritis left knee  PROCEDURE:  Procedure(s): TOTAL KNEE ARTHROPLASTY (Left)   DEPUY 12F 4T 10 PS POLY 38 X 9 PATELLA   SURGEON:  Surgeon(s) and Role:    Carole Civil, MD - Primary  PHYSICIAN ASSISTANT:   ASSISTANTS:BETTY ASHLEY   ANESTHESIA:   spinal  EBL:  20 mL   BLOOD ADMINISTERED:none  DRAINS: none   LOCAL MEDICATIONS USED:  EXPAREL 20 AND MARCAINE 0.5% WITH EPI 20    SPECIMEN:  No Specimen  DISPOSITION OF SPECIMEN:  N/A  COUNTS:  YES  TOURNIQUET:   Total Tourniquet Time Documented: Thigh (Left) - 91 minutes Total: Thigh (Left) - 91 minutes   DICTATION: .Viviann Spare Dictation  PLAN OF CARE: Admit to inpatient   PATIENT DISPOSITION:  PACU - hemodynamically stable.   Delay start of Pharmacological VTE agent (>24hrs) due to surgical blood loss or risk of bleeding: YES

## 2018-09-15 NOTE — Evaluation (Signed)
Physical Therapy Evaluation Patient Details Name: Bonnie Powers MRN: [id number] DOB: January 02, 1946 Today's Date: 09/15/2018  LEFT KNEE ROM: 5-85 degrees AMBULATION DISTANCE: 25 feet using RW with Minimal Assist    History of Present Illness  Bonnie Powers is a 73 year old female s/p Left TKA, 09/15/18 with of OA left knee, history of a coronary artery bypass graft in 2015 for non-STEMI MI with 2 blockages currently has no chest pain or shortness of breath    Clinical Impression  Patient limited mostly due to c/o nausea (RN aware) and left knee pain.  Patient able to achieve up to left knee 5-85 degrees ROM with good return for using RLE to help flexion left knee, able to ambulate just out side of room and requested to turn around due to nausea.  Patient will benefit from continued physical therapy in hospital and recommended venue below to increase strength, balance, endurance for safe ADLs and gait.    Follow Up Recommendations Home health PT;Supervision/Assistance - 24 hour;Supervision for mobility/OOB    Equipment Recommendations  None recommended by PT    Recommendations for Other Services       Precautions / Restrictions Precautions Precautions: Fall Precaution Comments: s/p Left TKA Restrictions Weight Bearing Restrictions: Yes LLE Weight Bearing: Weight bearing as tolerated      Mobility  Bed Mobility Overal bed mobility: Needs Assistance Bed Mobility: Supine to Sit;Sit to Supine     Supine to sit: Min assist Sit to supine: Min assist   General bed mobility comments: need help to move LLE, fair/good return for propping up on elbows to hands during supine to sitting  Transfers Overall transfer level: Needs assistance Equipment used: Rolling walker (2 wheeled) Transfers: Sit to/from [facility] Sit to Stand: Min guard;Min assist Stand pivot transfers: Min guard;Min assist       General transfer comment: increased time, labored  movement  Ambulation/Gait Ambulation/Gait assistance: Min guard;Min assist Gait Distance (Feet): 25 Feet Assistive device: Rolling walker (2 wheeled) Gait Pattern/deviations: Decreased step length - right;Decreased step length - left;Decreased stance time - left;Decreased stride length;Antalgic Gait velocity: decreased   General Gait Details: slow labored cadence with fair return for left heel to toe stepping, no loss of balance, limited for ambulation due to nausea, fatigue and left knee pain  Stairs            Wheelchair Mobility    Modified Rankin (Stroke Patients Only)       Balance Overall balance assessment: Needs assistance Sitting-balance support: Feet supported;No upper extremity supported Sitting balance-Leahy Scale: Good Sitting balance - Comments: seated at bedside   Standing balance support: Bilateral upper extremity supported;During functional activity Standing balance-Leahy Scale: Fair Standing balance comment: using RW                             Pertinent Vitals/Pain Pain Assessment: 0-10 Pain Score: 6  Pain Location: 5-6/10 left knee Pain Descriptors / Indicators: Sore Pain Intervention(s): Limited activity within patient's tolerance;Monitored during session    Home Living Family/patient expects to be discharged to:: Private residence Living Arrangements: Spouse/significant other Available Help at Discharge: Family;Available 24 hours/day Type of Home: Mobile home Home Access: Ramped entrance     Home Layout: One level Home Equipment: [place] - 2 wheels;Cane - single point;Toilet riser      Prior Function Level of Independence: Independent         Comments: Hydrographic surveyor, drives  Hand Dominance        Extremity/Trunk Assessment   Upper Extremity Assessment Upper Extremity Assessment: Generalized weakness    Lower Extremity Assessment Lower Extremity Assessment: Generalized weakness;RLE deficits/detail;LLE  deficits/detail RLE Deficits / Details: grossly 5/5 LLE Deficits / Details: grossly -4/5 LLE: Unable to fully assess due to pain    Cervical / Trunk Assessment Cervical / Trunk Assessment: Normal  Communication   Communication: No difficulties  Cognition Arousal/Alertness: Awake/alert Behavior During Therapy: WFL for tasks assessed/performed Overall Cognitive Status: Within Functional Limits for tasks assessed                                        General Comments      Exercises Total Joint Exercises Ankle Circles/Pumps: Supine;AROM;Strengthening;Left;5 reps Quad Sets: Supine;AROM;Strengthening;Left;5 reps Gluteal Sets: Supine;AROM;Strengthening;Both;5 reps Short Arc Quad: Supine;AROM;AAROM;Strengthening;Left;5 reps Heel Slides: Supine;AROM;AAROM;Left;5 reps   Assessment/Plan    PT Assessment Patient needs continued PT services  PT Problem List Decreased strength;Decreased activity tolerance;Decreased balance;Decreased range of motion;Decreased mobility       PT Treatment Interventions Therapeutic exercise;Gait training;Stair training;Functional mobility training;Therapeutic activities;Patient/family education;DME instruction    PT Goals (Current goals can be found in the Care Plan section)  Acute Rehab PT Goals Patient Stated Goal: return home with family to assist PT Goal Formulation: With patient Time For Goal Achievement: 09/19/18 Potential to Achieve Goals: Good    Frequency 7X/week   Barriers to discharge        Co-evaluation               AM-PAC PT "6 Clicks" Mobility  Outcome Measure Help needed turning from your back to your side while in a flat bed without using bedrails?: A Little Help needed moving from lying on your back to sitting on the side of a flat bed without using bedrails?: A Little Help needed moving to and from a bed to a chair (including a wheelchair)?: A Little Help needed standing up from a chair using your arms  (e.g., wheelchair or bedside chair)?: A Little Help needed to walk in hospital room?: A Lot Help needed climbing 3-5 steps with a railing? : A Lot 6 Click Score: 16    End of Session   Activity Tolerance: Patient tolerated treatment well;Patient limited by fatigue Patient left: in bed;with call bell/phone within reach Nurse Communication: Mobility status PT Visit Diagnosis: Unsteadiness on feet (R26.81);Other abnormalities of gait and mobility (R26.89);Muscle weakness (generalized) (M62.81)    Time: 4970-2637 PT Time Calculation (min) (ACUTE ONLY): 35 min   Charges:   PT Evaluation $PT Eval Moderate Complexity: 1 Mod PT Treatments $Therapeutic Activity: 23-37 mins        3:32 PM, 09/15/18 Lonell Grandchild, MPT Physical Therapist with [facility] [phone] office [phone] mobile phone

## 2018-09-15 NOTE — Anesthesia Procedure Notes (Signed)
Procedure Name: [place] Performed by: Andree Elk Rita Vialpando A, CRNA Pre-anesthesia Checklist: Patient identified, Emergency Drugs available, Suction available, Timeout performed and Patient being monitored Patient Re-evaluated:Patient Re-evaluated prior to induction Oxygen Delivery Method: Nasal Cannula

## 2018-09-15 NOTE — Op Note (Signed)
09/15/2018  9:48 AM  PATIENT:  Bonnie Powers  73 y.o. female  PRE-OPERATIVE DIAGNOSIS:  osteoarthritis left knee  POST-OPERATIVE DIAGNOSIS:  osteoarthritis left knee  FINDINGS: Moderate varus deformity, grade 4 arthritis medial compartment femur and tibia partially deficient medial meniscus ACL PCL intact Grade 2 arthritis lateral femoral condyle lateral meniscus intact Grade 2 arthritis lateral facet patella No flexion contractures in preop range of motion 0 to 130 degrees  PROCEDURE:  Procedure(s): TOTAL KNEE ARTHROPLASTY (Left) 27447  DEPUY 72F 4T 10 PS POLY 38 X 9 PATELLA   SURGEON:  Surgeon(s) and Role:    Carole Civil, MD - Primary  The patient was identified by 2 approved identification mechanisms. The operative extremity was evaluated and found to be acceptable for surgical treatment today. The chart was reviewed. The surgical site was confirmed and marked.  The patient was taken to the operating room and given appropriate antibiotic 2 g IV Ancef. This is consistent with the SCIP protocol.  The patient was given the following anesthetic: Spinal  The patient was then placed supine on the operating table. A Foley catheter was inserted. The operative extremity was prepped and draped sterilely from the toes to the groin.  Timeout procedure was executed confirming the patient's name, surgical site, antibiotic administration, x-rays available, and implants available.  The operative limb,  was exsanguinated with a six-inch Esmarch and the tourniquet was inflated to 300 mmHg.  A straight midline incision was made over the left KNEE and taken down to the extensor mechanism. A medial arthrotomy was performed. The patella was everted and the patellofemoral band was released. The anterior cruciate ligament and PCL were resected.  The anterior horns of the lateral and medial meniscus were resected. The medial soft tissue sleeve was elevated to the mid coronal plane.  A  three-eighths inch drill bit was used to enter the femoral canal which was decompressed with suction and irrigation until clear. The distal femoral cutting guide was set for 9 mm distal resection,  5valgus alignment, for a left knee. The distal femur was resected and checked for flatness.  The Depuy Sigma sizing femoral guide was placed and the femur was sized to a size between 4 and 5  I decided at this point to cut the tibia.The external alignment guide for the tibial resection was then applied to the distal and proximal tibia and set for anatomic slope along with 10 MM resection  from the higher lateral.  I also checked that this correlated to a 4 mm resection from the medial side.   Rotational alignment was set using the malleolus, the tibial tubercle and the tibial spines.  The proximal tibia was resected along with  residual menisci. The tibia was sized using a base plate to a size 4.   Spacer blocks were used to check the extension gap which was a little tight  I remeasured the femur and set it for a 5 femur to allow for possible re-cutting of the tibia if both gaps were tight.  A 4-in-1 cutting block was placed along with collateral ligament retractors and the distal femoral cuts were completed.  A size 10 mm spacer block still did not fit in the flexion or extension gap and therefore additional 2 mm of tibial bone was resected  This allowed full extension and excellent stability in flexion with a 10 mm spacer block  The correct sized notch cutting guide for the femur was then applied and the notch cut  was made.  Trial reduction was completed using 5 femur and 4 tibia trial implants. Patella tracking was normal  We then skeletonized the patella. It measured 23 mm in thickness and the patellar resection was set for 12 mm millimeters. the patellar resection was completed. The patella diameter measured 38 x 12 mm. We then drilled the peg holes for the patella  The proximal tibia was  prepared using the size 4 base plate.  Thorough irrigation was performed and the bone was dried and prepared for cement. The cement was mixed on the back table using third generation preparation techniques  20 cc of dilute Exparel was injected into the soft tissues including the posterior capsule.  The implants were then cemented in place and excess cement was removed. The cement was allowed to cure. Irrigation was repeated and excess and residual bone fragments and cement were removed.   The extensor mechanism was closed with #1 Bralon suture followed by subcutaneous tissue closure using 0 Monocryl suture  30 cc of Marcaine with epinephrine was injected into the skin area  Skin approximation was performed using staples  A sterile dressing was applied, followed by a ace wrapped from foot to thigh and then a Cryo/Cuff which was activated   The patient was taken recovery room in stable condition   PHYSICIAN ASSISTANT:   ASSISTANTS:BETTY ASHLEY   ANESTHESIA:   spinal  EBL:  20 mL   BLOOD ADMINISTERED:none  DRAINS: none   LOCAL MEDICATIONS USED:  EXPAREL 20 AND MARCAINE 0.5% WITH EPI 20    SPECIMEN:  No Specimen  DISPOSITION OF SPECIMEN:  N/A  COUNTS:  YES  TOURNIQUET:   Total Tourniquet Time Documented: Thigh (Left) - 91 minutes Total: Thigh (Left) - 91 minutes   DICTATION: .Viviann Spare Dictation  PLAN OF CARE: Admit to inpatient   PATIENT DISPOSITION:  PACU - hemodynamically stable.   Delay start of Pharmacological VTE agent (>24hrs) due to surgical blood loss or risk of bleeding: YES

## 2018-09-15 NOTE — Anesthesia Preprocedure Evaluation (Signed)
Anesthesia Evaluation  Patient identified by MRN, date of birth, ID band Patient awake    Reviewed: Allergy & Precautions, NPO status , Patient's Chart, lab work & pertinent test results  Airway Mallampati: II  TM Distance: >3 FB Neck ROM: Full    Dental no notable dental hx. (+) Missing, Poor Dentition   Pulmonary neg pulmonary ROS, Current Smoker,    Pulmonary exam normal breath sounds clear to auscultation       Cardiovascular Exercise Tolerance: Good hypertension, Pt. on medications and Pt. on home beta blockers + CAD, + Past MI and + CABG  Normal cardiovascular examI Rhythm:Regular Rate:Normal  States MI/CABG 2015 -denies ever using NTG Denies recent CP/DOE   Neuro/Psych  Headaches, PSYCHIATRIC DISORDERS Anxiety Depression    GI/Hepatic Neg liver ROS, PUD,   Endo/Other  negative endocrine ROS  Renal/GU negative Renal ROS  negative genitourinary   Musculoskeletal  (+) Arthritis , Osteoarthritis,    Abdominal   Peds negative pediatric ROS (+)  Hematology negative hematology ROS (+) anemia ,   Anesthesia Other Findings   Reproductive/Obstetrics negative OB ROS                             Anesthesia Physical Anesthesia Plan  ASA: III  Anesthesia Plan: Spinal   Post-op Pain Management:    Induction: Intravenous  PONV Risk Score and Plan:   Airway Management Planned: Nasal Cannula and Simple Face Mask  Additional Equipment:   Intra-op Plan:   Post-operative Plan:   Informed Consent: I have reviewed the patients History and Physical, chart, labs and discussed the procedure including the risks, benefits and alternatives for the proposed anesthesia with the patient or authorized representative who has indicated his/her understanding and acceptance.     Dental advisory given  Plan Discussed with: CRNA  Anesthesia Plan Comments: (Plan full PPE use  Plan SAB with GETA back  up as needed -WTP after Q&A)        Anesthesia Quick Evaluation

## 2018-09-15 NOTE — Interval H&P Note (Signed)
History and Physical Interval Note:  09/15/2018 7:22 AM  Bonnie Powers  has presented today for surgery, with the diagnosis of osteoarthritis left knee.  The various methods of treatment have been discussed with the patient and family. After consideration of risks, benefits and other options for treatment, the patient has consented to  Procedure(s): TOTAL KNEE ARTHROPLASTY (Left) as a surgical intervention.  The patient's history has been reviewed, patient examined, no change in status, stable for surgery.  I have reviewed the patient's chart and labs.  Questions were answered to the patient's satisfaction.     Arther Abbott

## 2018-09-15 NOTE — Anesthesia Postprocedure Evaluation (Signed)
Anesthesia Post Note  Patient: [name]  Procedure(s) Performed: TOTAL KNEE ARTHROPLASTY (Left Knee)  Patient location during evaluation: PACU Anesthesia Type: Spinal Level of consciousness: awake and alert and oriented Pain management: pain level controlled Vital Signs Assessment: post-procedure vital signs reviewed and stable Respiratory status: spontaneous breathing Cardiovascular status: stable Postop Assessment: no apparent nausea or vomiting Anesthetic complications: no     Last Vitals:  Vitals:   09/15/18 0702 09/15/18 0953  BP: 139/62   Pulse: 62   Resp: 18   Temp: 36.7 C 36.5 C    Last Pain:  Vitals:   09/15/18 0702  TempSrc: Oral  PainSc: 0-No pain                 Kelleen Stolze A

## 2018-09-16 DIAGNOSIS — M1712 Unilateral primary osteoarthritis, left knee: Secondary | ICD-10-CM | POA: Diagnosis not present

## 2018-09-16 LAB — CBC
HCT: 33.5 % — ABNORMAL LOW (ref 36.0–46.0)
Hemoglobin: 10.5 g/dL — ABNORMAL LOW (ref 12.0–15.0)
MCH: 30 pg (ref 26.0–34.0)
MCHC: 31.3 g/dL (ref 30.0–36.0)
MCV: 95.7 fL (ref 80.0–100.0)
Platelets: 226 10*3/uL (ref 150–400)
RBC: 3.5 MIL/uL — ABNORMAL LOW (ref 3.87–5.11)
RDW: 14.9 % (ref 11.5–15.5)
WBC: 7.3 10*3/uL (ref 4.0–10.5)
nRBC: 0 % (ref 0.0–0.2)

## 2018-09-16 LAB — BASIC METABOLIC PANEL
Anion gap: 8 (ref 5–15)
BUN: 15 mg/dL (ref 8–23)
CO2: 25 mmol/L (ref 22–32)
Calcium: 9.2 mg/dL (ref 8.9–10.3)
Chloride: 102 mmol/L (ref 98–111)
Creatinine, Ser: 0.77 mg/dL (ref 0.44–1.00)
GFR calc Af Amer: >60 mL/min (ref >60)
GFR calc non Af Amer: >60 mL/min (ref >60)
Glucose, Bld: 110 mg/dL — ABNORMAL HIGH (ref 70–99)
Potassium: 3.8 mmol/L (ref 3.5–5.1)
Sodium: 135 mmol/L (ref 135–145)

## 2018-09-16 NOTE — Care Management CC44 (Signed)
Condition Code 44 Documentation Completed  Patient Details  Name: Bonnie Powers MRN: [id number] Date of Birth: 1945-04-29   Condition Code 44 given:  Yes Patient signature on Condition Code 44 notice:  Yes Documentation of 2 MD's agreement:  Yes Code 44 added to claim:  Yes    [place], LCSW 09/16/2018, 3:50 PM

## 2018-09-16 NOTE — Progress Notes (Signed)
Patient ID: Bonnie Powers, female   DOB: February 18, 1946, 73 y.o.   MRN: [id number] Postop day 1 status post left total knee  [name] is doing well she seems a little sluggish probably from the pain medication but she answered questions appropriately  Her pain is well controlled  Her neurovascular exam is normal  The SCDs seem to be bothering her  CBC Latest Ref Rng & Units 09/16/2018 09/11/2018 08/08/2017  WBC 4.0 - 10.5 K/uL 7.3 5.1 7.6  Hemoglobin 12.0 - 15.0 g/dL 10.5(L) 12.4 12.4  Hematocrit 36.0 - 46.0 % 33.5(L) 38.1 37.5  Platelets 150 - 400 K/uL 226 286 297   BMP Latest Ref Rng & Units 09/16/2018 09/11/2018 12/23/2017  Glucose 70 - 99 mg/dL 110(H) 104(H) 94  BUN 8 - 23 mg/dL 15 14 17   Creatinine 0.44 - 1.00 mg/dL 0.77 0.86 0.86  BUN/Creat Ratio 6 - 22 (calc) - - NOT APPLICABLE  Sodium 147 - 145 mmol/L 135 136 137  Potassium 3.5 - 5.1 mmol/L 3.8 3.5 3.8  Chloride 98 - 111 mmol/L 102 100 102  CO2 22 - 32 mmol/L 25 24 26   Calcium 8.9 - 10.3 mg/dL 9.2 9.7 9.8    Postop total knee doing well continue physical therapy Continue discharge planning Should be ready to discharge to SNF tomorrow or home if ambulates and able to get out of bed

## 2018-09-16 NOTE — Plan of Care (Signed)

## 2018-09-16 NOTE — Progress Notes (Addendum)
Physical Therapy Treatment Patient Details Name: Bonnie Powers MRN: [id number] DOB: [birth year] Today's Date: 09/16/2018  LEFT KNEE ROM:  0-92 degrees AMBULATION DISTANCE: 30 feet using RW with Min guard assist CPM ROM: 0-77 degrees    History of Present Illness Bonnie Powers is a 72 year old female s/p Left TKA, 09/15/18 with of OA left knee, history of a coronary artery bypass graft in 2015 for non-STEMI MI with 2 blockages currently has no chest pain or shortness of breath    PT Comments    Patient demonstrates increased right knee flexion while performing self stretching using RLE seated at bedside, slightly increased endurance/tolerance for taking steps with limited weightbearing on LLE due to increased pain.  Patient tolerated sitting up in chair with LLE dangling after therapy - RN notified.  Patient will benefit from continued physical therapy in hospital and recommended venue below to increase strength, balance, endurance for safe ADLs and gait.  In PM patient tolerated CPM set up at 0-77 degrees with pain at baseline.  Patient instructed in use with good return demonstrated and understanding acknowledged.   Follow Up Recommendations  Home health PT;Supervision/Assistance - 24 hour;Supervision for mobility/OOB     Equipment Recommendations  None recommended by PT    Recommendations for Other Services       Precautions / Restrictions Precautions Precautions: Fall Precaution Comments: s/p Left TKA Restrictions Weight Bearing Restrictions: Yes LLE Weight Bearing: Weight bearing as tolerated    Mobility  Bed Mobility Overal bed mobility: Needs Assistance Bed Mobility: Supine to Sit     Supine to sit: Min assist     General bed mobility comments: able to use BUE to help sit up and scoot self to EOB  Transfers Overall transfer level: Needs assistance Equipment used: Rolling walker (2 wheeled) Transfers: Sit to/from [facility] Sit to Stand:  Min guard;Min assist Stand pivot transfers: Min guard;Min assist       General transfer comment: increased time, labored movement  Ambulation/Gait Ambulation/Gait assistance: Min guard Gait Distance (Feet): 30 Feet Assistive device: Rolling walker (2 wheeled) Gait Pattern/deviations: Decreased step length - right;Decreased step length - left;Decreased stance time - left;Decreased stride length;Antalgic Gait velocity: decreased   General Gait Details: slow labored cadence with fair/poor tolerance for fully weighbearing on LLE, fair return for left heel to toe stepping and limited mostly due to c/o mild nausea and fatigue   Stairs             Wheelchair Mobility    Modified Rankin (Stroke Patients Only)       Balance Overall balance assessment: Needs assistance Sitting-balance support: Feet supported;No upper extremity supported Sitting balance-Leahy Scale: Good Sitting balance - Comments: seated at bedside   Standing balance support: Bilateral upper extremity supported;During functional activity Standing balance-Leahy Scale: Fair Standing balance comment: using RW                            Cognition Arousal/Alertness: Awake/alert Behavior During Therapy: WFL for tasks assessed/performed Overall Cognitive Status: Within Functional Limits for tasks assessed                                        Exercises Total Joint Exercises Ankle Circles/Pumps: Supine;AROM;Strengthening;10 reps;Both Quad Sets: Supine;AROM;Strengthening;Both;10 reps Short Arc Quad: Supine;AROM;AAROM;Strengthening;Left;10 reps Heel Slides: Supine;AROM;AAROM;Left;10 reps Goniometric ROM: 0-92 degrees left knee  General Comments        Pertinent Vitals/Pain Pain Score: 7  Pain Location: left knee and left ankle Pain Descriptors / Indicators: Sore;Discomfort Pain Intervention(s): Limited activity within patient's tolerance;Monitored during session    Home  Living                      Prior Function            PT Goals (current goals can now be found in the care plan section) Acute Rehab PT Goals Patient Stated Goal: return home with family to assist PT Goal Formulation: With patient Time For Goal Achievement: 09/19/18 Potential to Achieve Goals: Good Progress towards PT goals: Progressing toward goals    Frequency    7X/week      PT Plan Current plan remains appropriate    Co-evaluation              AM-PAC PT "6 Clicks" Mobility   Outcome Measure  Help needed turning from your back to your side while in a flat bed without using bedrails?: A Little Help needed moving from lying on your back to sitting on the side of a flat bed without using bedrails?: A Little Help needed moving to and from a bed to a chair (including a wheelchair)?: A Little Help needed standing up from a chair using your arms (e.g., wheelchair or bedside chair)?: A Little Help needed to walk in hospital room?: A Lot Help needed climbing 3-5 steps with a railing? : A Lot 6 Click Score: 16    End of Session   Activity Tolerance: Patient tolerated treatment well;Patient limited by fatigue Patient left: in chair;with call bell/phone within reach Nurse Communication: Mobility status PT Visit Diagnosis: Unsteadiness on feet (R26.81);Other abnormalities of gait and mobility (R26.89);Muscle weakness (generalized) (M62.81)     Time: 3710-6269 PT Time Calculation (min) (ACUTE ONLY): 37 min  Charges:  $Therapeutic Exercise: 8-22 mins $Therapeutic Activity: 8-22 mins                     2:34 PM, 09/16/18 Lonell Grandchild, MPT Physical Therapist with [facility] 336 [phone] office [phone] mobile phone

## 2018-09-17 ENCOUNTER — Encounter (HOSPITAL_COMMUNITY): Payer: Self-pay | Admitting: Orthopedic Surgery

## 2018-09-17 DIAGNOSIS — M1712 Unilateral primary osteoarthritis, left knee: Secondary | ICD-10-CM | POA: Diagnosis not present

## 2018-09-17 NOTE — Addendum Note (Signed)
Addendum  created 09/17/18 0733 by Mickel Baas, CRNA   Clinical Note Signed

## 2018-09-17 NOTE — Care Management Obs Status (Signed)
[place] NOTIFICATION   Patient Details  Name: Bonnie Powers MRN: [id number] Date of Birth: [birth year]   Medicare Observation Status Notification Given:  Yes    Shade Flood, LCSW 09/17/2018, 11:47 AM

## 2018-09-17 NOTE — TOC Transition Note (Signed)
Transition of Care [facility]) - CM/SW Discharge Note   Patient Details  Name: Bonnie Powers MRN: [id number] Date of Birth: Apr 13, 1945  Transition of Care [facility]) CM/SW Contact:  Shade Flood, LCSW Phone Number: 09/17/2018, 11:44 AM   Clinical Narrative:     Per chart review, anticipating pt will dc home with [facility] PT today. HH arranged with [facility]. Messaged Dr. Aline Brochure and his RN to please enter orders for [facility]. There are no other TOC needs for dc.  Final next level of care: [place] Barriers to Discharge: Barriers Resolved   Patient Goals and CMS Choice Patient states their goals for this hospitalization and ongoing recovery are:: Return home and return to improved level of functioning CMS Medicare.gov Compare Post Acute Care list provided to:: Patient Choice offered to / list presented to : Patient  Discharge Placement                       Discharge Plan and Services In-house Referral: Clinical Social Work   Post Acute Care Choice: Home Health                    HH Arranged: PT [facility] - Inpatient Agency: [facility]   Time [place]: 1115 Representative spoke with at [place]: [place] ([place]) Interventions     Readmission Risk Interventions No flowsheet data found.

## 2018-09-17 NOTE — Progress Notes (Signed)
Physical Therapy Treatment Patient Details Name: Bonnie Powers MRN: [id number] DOB: Aug 01, 1945 Today's Date: 09/17/2018  LEFT KNEE ROM:  0-102 degrees AMBULATION DISTANCE: 120 feet using RW with Supervision CPM: 0-92 degrees    History of Present Illness Bonnie Powers is a 73 year old female s/p Left TKA, 09/15/18 with of OA left knee, history of a coronary artery bypass graft in 2015 for non-STEMI MI with 2 blockages currently has no chest pain or shortness of breath    PT Comments    Patient presents sitting on commode in bathroom and agreeable for therapy.  Patient demonstrates increased strength LLE for sit to stands with good return transferring from commode in bathroom, able to stand over sink to wash hands, increased endurance/distance for gait training with fair/good return for left heel to stepping and good return for going up/down 4 steps using bilateral side rails for stair training.  Patient tolerated sitting up in chair after therapy with legs elevated.  Patient will benefit from continued physical therapy in hospital and recommended venue below to increase strength, balance, endurance for safe ADLs and gait.   Follow Up Recommendations  Home health PT;Supervision - Intermittent;Supervision for mobility/OOB     Equipment Recommendations  None recommended by PT    Recommendations for Other Services       Precautions / Restrictions Precautions Precautions: Fall Restrictions Weight Bearing Restrictions: Yes LLE Weight Bearing: Weight bearing as tolerated    Mobility  Bed Mobility               General bed mobility comments: Patient presents seated on commode in bathroom (assisted by nurisng staff)  Transfers Overall transfer level: Needs assistance Equipment used: Rolling walker (2 wheeled) Transfers: Sit to/from [facility] Sit to Stand: Supervision Stand pivot transfers: Supervision       General transfer comment: demonstrates  good return for sit to stands from commode in bathroom using RW  Ambulation/Gait Ambulation/Gait assistance: Supervision Gait Distance (Feet): 120 Feet Assistive device: Rolling walker (2 wheeled) Gait Pattern/deviations: Decreased step length - left;Decreased stance time - left;Decreased stride length;Antalgic Gait velocity: decreased   General Gait Details: increased endurance/distance for ambulation with fair/good return for left heel to toe stepping, occasional stumbles without loss of balance, limited secondary to c/o fatigue   Stairs Stairs: Yes Stairs assistance: Supervision Stair Management: Two rails;Step to pattern Number of Stairs: 4 General stair comments: Patient demonstrates good return for going up/down steps using Bilateral siderails with step to pattern without loss of balance   Wheelchair Mobility    Modified Rankin (Stroke Patients Only)       Balance Overall balance assessment: Needs assistance Sitting-balance support: Feet supported;No upper extremity supported Sitting balance-Leahy Scale: Good     Standing balance support: Bilateral upper extremity supported;During functional activity Standing balance-Leahy Scale: Fair Standing balance comment: fair using RW                            Cognition Arousal/Alertness: Awake/alert Behavior During Therapy: WFL for tasks assessed/performed Overall Cognitive Status: Within Functional Limits for tasks assessed                                        Exercises Other Exercises Other Exercises: seated self stretching left knee using RLE x 2 sets of 1 minute holds    General Comments  Pertinent Vitals/Pain Pain Assessment: 0-10 Pain Score: 8  Pain Location: left knee Pain Descriptors / Indicators: Sore Pain Intervention(s): Limited activity within patient's tolerance;Monitored during session    Home Living                      Prior Function             PT Goals (current goals can now be found in the care plan section) Acute Rehab PT Goals Patient Stated Goal: return home with family to assist PT Goal Formulation: With patient Time For Goal Achievement: 09/19/18 Potential to Achieve Goals: Good Progress towards PT goals: Progressing toward goals    Frequency    7X/week      PT Plan Current plan remains appropriate    Co-evaluation              AM-PAC PT "6 Clicks" Mobility   Outcome Measure  Help needed turning from your back to your side while in a flat bed without using bedrails?: A Little Help needed moving from lying on your back to sitting on the side of a flat bed without using bedrails?: A Little Help needed moving to and from a bed to a chair (including a wheelchair)?: A Little Help needed standing up from a chair using your arms (e.g., wheelchair or bedside chair)?: A Little Help needed to walk in hospital room?: A Little Help needed climbing 3-5 steps with a railing? : A Little 6 Click Score: 18    End of Session Equipment Utilized During Treatment: Gait belt Activity Tolerance: Patient tolerated treatment well;Patient limited by fatigue;Patient limited by pain Patient left: in chair;with call bell/phone within reach Nurse Communication: Mobility status PT Visit Diagnosis: Unsteadiness on feet (R26.81);Other abnormalities of gait and mobility (R26.89);Muscle weakness (generalized) (M62.81)     Time: 3710-6269 PT Time Calculation (min) (ACUTE ONLY): 28 min  Charges:  $Gait Training: 8-22 mins $Therapeutic Activity: 8-22 mins                     11:15 AM, 09/17/18 Lonell Grandchild, MPT Physical Therapist with [facility] [phone] office [phone] mobile phone

## 2018-09-17 NOTE — TOC Initial Note (Signed)
Transition of Care [facility]) - Initial/Assessment Note    Patient Details  Name: Bonnie Powers MRN: [id number] Date of Birth: Oct 20, 1945  Transition of Care [facility]) CM/SW Contact:    Shade Flood, LCSW Phone Number: 09/17/2018, 11:39 AM  Clinical Narrative:                  PT recommending [place] PT. Spoke with pt today to assess. Per pt, she will be returning home at dc. She has a walker, toilet riser, CPM, cane and shower chair. Pt has support from family. She is agreeable to [facility] and requests [facility]. Referral made to Tim and they will follow. Pt states that she does not have any other TOC needs for dc.  Expected Discharge Plan: [place] Barriers to Discharge: Barriers Resolved   Patient Goals and CMS Choice Patient states their goals for this hospitalization and ongoing recovery are:: Return home and return to improved level of functioning CMS Medicare.gov Compare Post Acute Care list provided to:: Patient Choice offered to / list presented to : Patient  Expected Discharge Plan and Services Expected Discharge Plan: [place] In-house Referral: Clinical Social Work   Post Acute Care Choice: [place] arrangements for the past 2 months: [place] Expected Discharge Date: 09/18/18                         HH Arranged: PT [place] Agency: [facility]   Time [place]: 1115 Representative spoke with at [place]: [place] Arrangements/Services Living arrangements for the past 2 months: [place] with:: Significant Other Patient language and need for interpreter reviewed:: Yes Do you feel safe going back to the place where you live?: Yes      Need for Family Participation in Patient Care: No (Comment) Care giver support system in place?: Yes (comment) Current home services: DME Criminal Activity/Legal Involvement Pertinent to Current  Situation/Hospitalization: No - Comment as needed  Activities of Daily Living Home Assistive Devices/Equipment: Eyeglasses ADL Screening (condition at time of admission) Patient's cognitive ability adequate to safely complete daily activities?: Yes Is the patient deaf or have difficulty hearing?: Yes Does the patient have difficulty seeing, even when wearing glasses/contacts?: No Does the patient have difficulty concentrating, remembering, or making decisions?: No Patient able to express need for assistance with ADLs?: Yes Does the patient have difficulty dressing or bathing?: No Independently performs ADLs?: Yes (appropriate for developmental age) Does the patient have difficulty walking or climbing stairs?: Yes Weakness of Legs: Left Weakness of Arms/Hands: None  Permission Sought/Granted                  Emotional Assessment Appearance:: Appears stated age Attitude/Demeanor/Rapport: Engaged Affect (typically observed): Calm, Pleasant Orientation: : Oriented to Self, Oriented to Place, Oriented to  Time, Oriented to Situation Alcohol / Substance Use: Not Applicable Psych Involvement: No (comment)  Admission diagnosis:  osteoarthritis left knee Patient Active Problem List   Diagnosis Date Noted  . Primary osteoarthritis of left knee   . Flatulence 03/04/2018  . Acute blood loss anemia 04/07/2014  . CAD (coronary artery disease) 02/25/2014  . NSTEMI (non-ST elevated myocardial infarction) ([place]) 02/23/2014  . Essential hypertension 02/23/2014  . Hyperlipidemia 02/23/2014  . Tobacco abuse 02/23/2014  . Sinusitis, acute 12/14/2012  . UC (ulcerative colitis) ([place]) 08/17/2010  . ULCERATIVE COLITIS--UNIVERSAL ULCERATIVE COLITIS  11/07/2009  . RECTAL BLEEDING 11/07/2009  . Diarrhea 11/07/2009   PCP:  Renee Rival, NP Pharmacy:   [facility] [address], [place] [address] Phone: [phone] Fax:  [phone]     Social Determinants of Health (SDOH) Interventions    Readmission Risk Interventions No flowsheet data found.

## 2018-09-17 NOTE — Anesthesia Postprocedure Evaluation (Signed)
Anesthesia Post Note  Patient: Bonnie Powers  Procedure(s) Performed: TOTAL KNEE ARTHROPLASTY (Left Knee)  Patient location during evaluation: Nursing Unit Anesthesia Type: Spinal Level of consciousness: awake and alert and oriented Pain management: pain level controlled Vital Signs Assessment: post-procedure vital signs reviewed and stable Respiratory status: spontaneous breathing and respiratory function stable Cardiovascular status: stable Postop Assessment: no apparent nausea or vomiting Anesthetic complications: no     Last Vitals:  Vitals:   09/17/18 0013 09/17/18 0423  BP: (!) 140/58 (!) 125/50  Pulse: 81 72  Resp: 16 17  Temp:  36.8 C  SpO2: 93% 93%    Last Pain:  Vitals:   09/17/18 0423  TempSrc: Oral  PainSc: Asleep                 ADAMS, AMY A

## 2018-09-17 NOTE — Plan of Care (Signed)

## 2018-09-18 DIAGNOSIS — M1712 Unilateral primary osteoarthritis, left knee: Secondary | ICD-10-CM | POA: Diagnosis not present

## 2018-09-18 MED ORDER — DOCUSATE SODIUM 100 MG PO CAPS: 100.0000 mg | ORAL_CAPSULE | Freq: Two times a day (BID) | ORAL | 0 refills | Status: DC

## 2018-09-18 MED ORDER — TRAMADOL HCL 50 MG PO TABS: 50.0000 mg | ORAL_TABLET | Freq: Four times a day (QID) | ORAL | 2 refills | Status: AC

## 2018-09-18 MED ORDER — POLYETHYLENE GLYCOL 3350 17 G PO PACK: 17.0000 g | PACK | Freq: Every day | ORAL | 0 refills | Status: DC

## 2018-09-18 MED ORDER — HYDROCODONE-ACETAMINOPHEN 7.5-325 MG PO TABS: 1.0000 | ORAL_TABLET | ORAL | 0 refills | Status: DC | PRN

## 2018-09-18 MED ORDER — METHOCARBAMOL 500 MG PO TABS: 500.0000 mg | ORAL_TABLET | Freq: Four times a day (QID) | ORAL | 0 refills | Status: DC | PRN

## 2018-09-18 NOTE — Progress Notes (Signed)
Physical Therapy Treatment Patient Details Name: Bonnie Powers MRN: [id number] DOB: [birth year] Today's Date: 09/18/2018  LEFT KNEE ROM: 0-110 degrees AMBULATION DISTANCE:  150 feet using RW with Mod Indep CPM ROM: 0-92 degrees   History of Present Illness Bonnie Powers is a 73 year old female s/p Left TKA, 09/15/18 with of OA left knee, history of a coronary artery bypass graft in 2015 for non-STEMI MI with 2 blockages currently has no chest pain or shortness of breath    PT Comments    Patient demonstrates much improvement for moving LLE during bed mobility and increased endurance/distance for gait training with fair/good return for left heel to toe stepping without loss of balance.  Patient able to achieve increased left knee flexion during self stretching while seated at bedside.  Patient will benefit from continued physical therapy in hospital and recommended venue below to increase strength, balance, endurance for safe ADLs and gait.   Follow Up Recommendations  Home health PT;Supervision - Intermittent;Supervision for mobility/OOB     Equipment Recommendations  None recommended by PT    Recommendations for Other Services       Precautions / Restrictions Precautions Precautions: Fall Restrictions Weight Bearing Restrictions: Yes LLE Weight Bearing: Weight bearing as tolerated    Mobility  Bed Mobility Overal bed mobility: Modified Independent Bed Mobility: Supine to Sit;Sit to Supine     Supine to sit: Modified independent (Device/Increase time) Sit to supine: Modified independent (Device/Increase time)   General bed mobility comments: increased time  Transfers Overall transfer level: Modified independent Equipment used: Rolling walker (2 wheeled) Transfers: Sit to/from [facility] Sit to Stand: Modified independent (Device/Increase time) Stand pivot transfers: Modified independent (Device/Increase time)       General transfer comment:  requires less assistance  Ambulation/Gait   Gait Distance (Feet): 150 Feet Assistive device: Rolling walker (2 wheeled) Gait Pattern/deviations: Decreased step length - left;Decreased stance time - left;Decreased stride length;Antalgic Gait velocity: decreased   General Gait Details: increased endurance/distance for ambulation with fair/good return for left heel to toe stepping without loss of balance   Stairs             Wheelchair Mobility    Modified Rankin (Stroke Patients Only)       Balance Overall balance assessment: Needs assistance Sitting-balance support: Feet supported;No upper extremity supported Sitting balance-Leahy Scale: Good     Standing balance support: Bilateral upper extremity supported;During functional activity Standing balance-Leahy Scale: Fair Standing balance comment: fair/good using RW                            Cognition Arousal/Alertness: Awake/alert Behavior During Therapy: WFL for tasks assessed/performed Overall Cognitive Status: Within Functional Limits for tasks assessed                                        Exercises Total Joint Exercises Ankle Circles/Pumps: Supine;AROM;Strengthening;10 reps;Both Quad Sets: Supine;AROM;Strengthening;10 reps;Left Short Arc Quad: Supine;AROM;AAROM;Strengthening;Left;10 reps Heel Slides: Supine;AROM;AAROM;Left;10 reps Goniometric ROM: left knee 0-110 degrees    General Comments        Pertinent Vitals/Pain Pain Assessment: 0-10 Pain Score: 7  Pain Location: left knee Pain Descriptors / Indicators: Sore Pain Intervention(s): Limited activity within patient's tolerance;Monitored during session    Home Living  Prior Function            PT Goals (current goals can now be found in the care plan section) Acute Rehab PT Goals Patient Stated Goal: return home with family to assist PT Goal Formulation: With patient Time For Goal  Achievement: 09/18/18 Potential to Achieve Goals: Good Progress towards PT goals: Progressing toward goals    Frequency    7X/week      PT Plan Current plan remains appropriate    Co-evaluation              AM-PAC PT "6 Clicks" Mobility   Outcome Measure  Help needed turning from your back to your side while in a flat bed without using bedrails?: None Help needed moving from lying on your back to sitting on the side of a flat bed without using bedrails?: None Help needed moving to and from a bed to a chair (including a wheelchair)?: None Help needed standing up from a chair using your arms (e.g., wheelchair or bedside chair)?: None Help needed to walk in hospital room?: A Little Help needed climbing 3-5 steps with a railing? : A Little 6 Click Score: 22    End of Session   Activity Tolerance: Patient tolerated treatment well Patient left: in bed;with call bell/phone within reach(seated at bedside) Nurse Communication: Mobility status PT Visit Diagnosis: Unsteadiness on feet (R26.81);Other abnormalities of gait and mobility (R26.89);Muscle weakness (generalized) (M62.81)     Time: 0340-3524 PT Time Calculation (min) (ACUTE ONLY): 24 min  Charges:  $Gait Training: 8-22 mins $Therapeutic Exercise: 8-22 mins    10:56 AM, 09/18/18 Bonnie Powers, MPT Physical Therapist with [facility] [phone] office [phone] mobile phone

## 2018-09-18 NOTE — Discharge Summary (Signed)
Physician Discharge Summary  Patient ID: Bonnie Powers MRN: [id number] DOB/AGE: 09-14-1945 73 y.o.  Admit date: 09/15/2018 Discharge date: 09/18/2018  Admission Diagnoses: osteoarthritis left knee   Discharge Diagnoses: same   Discharged Condition: good  Procedure: left tka   Hospital Course: routine   LEFT KNEE ROM:  0-102 degrees AMBULATION DISTANCE: 120 feet using RW with Supervision CPM: 0-92 degrees  CBC Latest Ref Rng & Units 09/16/2018 09/11/2018 08/08/2017  WBC 4.0 - 10.5 K/uL 7.3 5.1 7.6  Hemoglobin 12.0 - 15.0 g/dL 10.5(L) 12.4 12.4  Hematocrit 36.0 - 46.0 % 33.5(L) 38.1 37.5  Platelets 150 - 400 K/uL 226 286 297   BMP Latest Ref Rng & Units 09/16/2018 09/11/2018 12/23/2017  Glucose 70 - 99 mg/dL 110(H) 104(H) 94  BUN 8 - 23 mg/dL 15 14 17   Creatinine 0.44 - 1.00 mg/dL 0.77 0.86 0.86  BUN/Creat Ratio 6 - 22 (calc) - - NOT APPLICABLE  Sodium 709 - 145 mmol/L 135 136 137  Potassium 3.5 - 5.1 mmol/L 3.8 3.5 3.8  Chloride 98 - 111 mmol/L 102 100 102  CO2 22 - 32 mmol/L 25 24 26   Calcium 8.9 - 10.3 mg/dL 9.2 9.7 9.8     Discharge Exam: Blood pressure (!) 123/56, pulse 71, temperature 98.4 F (36.9 C), temperature source Oral, resp. rate 18, height 5' 6"  (1.676 m), weight 96.3 kg, SpO2 97 %.   Disposition: Discharge disposition: 01-Home or Self Care       Discharge Instructions    CPM   Complete by: As directed    Use machine for 2 weeks   Use it 6 hrs per day (you can do 1 or 2 hrs at a time or 6 hrs in a row)   Call MD / Call 911   Complete by: As directed    If you experience chest pain or shortness of breath, CALL 911 and be transported to the hospital emergency room.  If you develope a fever above 101 F, pus (white drainage) or increased drainage or redness at the wound, or calf pain, call your surgeon's office.   Change dressing   Complete by: As directed    Change dressing on monday, then change the dressing daily with sterile 4 x 4 inch gauze  dressing and apply TED hose.  You may clean the incision with alcohol prior to redressing.   Constipation Prevention   Complete by: As directed    Drink plenty of fluids.  Prune juice may be helpful.  You may use a stool softener, such as Colace (over the counter) 100 mg twice a day.  Use MiraLax (over the counter) for constipation as needed.   Diet - low sodium heart healthy   Complete by: As directed    Discharge instructions   Complete by: As directed    Use bone foam 30 min 3 x a day for 2 weeks   Do not put a pillow under the knee. Place it under the heel.   Complete by: As directed    Driving restrictions   Complete by: As directed    No driving for  weeks   Increase activity slowly as tolerated   Complete by: As directed    TED hose   Complete by: As directed    Use stockings (TED hose) for 4 weeks on both leg(s).  You may remove them at night for sleeping.     Allergies as of 09/18/2018      Reactions  Statins Other (See Comments)   Muscle aches   Aspirin    ULCERS      Medication List    STOP taking these medications   acetaminophen 500 MG tablet Commonly known as: TYLENOL     TAKE these medications   ALPRAZolam 0.5 MG tablet Commonly known as: XANAX Take 0.5 mg by mouth daily as needed for anxiety.   aspirin EC 81 MG tablet Take 1 tablet (81 mg total) by mouth daily.   azaTHIOprine 50 MG tablet Commonly known as: IMURAN TAKE 3 TABLETS (150 MG) BY MOUTH DAILY AS DIRECTED What changed:   how much to take  how to take this  when to take this   B-12 2500 MCG Tabs Take 2,500 mcg by mouth daily.   Calcium-Vitamin D 600-400 MG-UNIT Tabs Take 1 tablet by mouth daily.   cholecalciferol 1000 units tablet Commonly known as: VITAMIN D Take 1,000 Units by mouth daily.   docusate sodium 100 MG capsule Commonly known as: COLACE Take 1 capsule (100 mg total) by mouth 2 (two) times daily.   hydrochlorothiazide 12.5 MG capsule Commonly known as:  MICROZIDE Take 12.5 mg by mouth daily.   HYDROcodone-acetaminophen 7.5-325 MG tablet Commonly known as: NORCO Take 1 tablet by mouth every 4 (four) hours as needed for severe pain (pain score 7-10). Use tramadol first if pain persists then take hydrocodone   Magnesium 250 MG Tabs Take 250 mg by mouth every other day.   methocarbamol 500 MG tablet Commonly known as: ROBAXIN Take 1 tablet (500 mg total) by mouth every 6 (six) hours as needed for muscle spasms.   metoprolol tartrate 25 MG tablet Commonly known as: LOPRESSOR Take 0.5 tablets (12.5 mg total) by mouth 2 (two) times daily.   multivitamin with minerals Tabs tablet Take 1 tablet by mouth daily.   nitroGLYCERIN 0.4 MG SL tablet Commonly known as: NITROSTAT Place 1 tablet (0.4 mg total) under the tongue every 5 (five) minutes as needed for chest pain.   polyethylene glycol 17 g packet Commonly known as: MIRALAX / GLYCOLAX Take 17 g by mouth daily.   PROBIOTIC FORMULA PO Take 1 tablet by mouth every evening.   traMADol 50 MG tablet Commonly known as: ULTRAM Take 1 tablet (50 mg total) by mouth every 6 (six) hours for 7 days.   vitamin C 500 MG tablet Commonly known as: ASCORBIC ACID Take 500 mg by mouth every other day.            Discharge Care Instructions  (From admission, onward)         Start     Ordered   09/18/18 0000  Change dressing    Comments: Change dressing on monday, then change the dressing daily with sterile 4 x 4 inch gauze dressing and apply TED hose.  You may clean the incision with alcohol prior to redressing.   09/18/18 0756         Follow-up Information    Carole Civil, MD Follow up on 09/30/2018.   Specialties: Orthopedic Surgery, Radiology Contact information: [address] [place] [address] [phone]           Signed: Arther Abbott 09/18/2018, 7:57 AM

## 2018-09-21 ENCOUNTER — Telehealth: Payer: Self-pay | Admitting: Orthopedic Surgery

## 2018-09-21 NOTE — Telephone Encounter (Signed)
Call received from physical therapist Joey from [facility] requesting verbal orders for plan of care following patient's total knee replacement surgery - for 1 week x1, 3 weeks x2; may leave a message; ph [phone]

## 2018-09-21 NOTE — Telephone Encounter (Signed)
Called with verbal.

## 2018-09-22 ENCOUNTER — Telehealth: Payer: Self-pay | Admitting: Radiology

## 2018-09-22 NOTE — Telephone Encounter (Signed)
Is ok.

## 2018-09-22 NOTE — Telephone Encounter (Signed)
Did you change your instructions?

## 2018-09-22 NOTE — Telephone Encounter (Signed)
Joey from [facility] called today states patient removed dressing, your post op instructions told her to change dressing and clean with alcohol  Are these instructions new? Have you changed this, because previously instructions have been post op dressing in place for 2 weeks, until follow up

## 2018-09-24 LAB — BPAM RBC
Blood Product Expiration Date: 202007012359
Blood Product Expiration Date: 202007012359
Unit Type and Rh: 6200
Unit Type and Rh: 6200

## 2018-09-24 LAB — TYPE AND SCREEN
ABO/RH(D): A POS
Antibody Screen: NEGATIVE
Unit division: 0
Unit division: 0

## 2018-09-28 DIAGNOSIS — Z96652 Presence of left artificial knee joint: Secondary | ICD-10-CM | POA: Insufficient documentation

## 2018-09-30 ENCOUNTER — Ambulatory Visit (INDEPENDENT_AMBULATORY_CARE_PROVIDER_SITE_OTHER): Payer: Medicare HMO | Admitting: Orthopedic Surgery

## 2018-09-30 ENCOUNTER — Other Ambulatory Visit: Payer: Self-pay

## 2018-09-30 ENCOUNTER — Encounter: Payer: Self-pay | Admitting: Orthopedic Surgery

## 2018-09-30 DIAGNOSIS — Z96652 Presence of left artificial knee joint: Secondary | ICD-10-CM

## 2018-09-30 MED ORDER — METHOCARBAMOL 500 MG PO TABS: 500.0000 mg | ORAL_TABLET | Freq: Four times a day (QID) | ORAL | 1 refills | Status: DC | PRN

## 2018-09-30 NOTE — Progress Notes (Signed)
Patient ID: Bonnie Powers, female   DOB: 06-Dec-1945, 74 y.o.   MRN: [id number]  Chief Complaint  Patient presents with  . Routine Post Op    09/15/2018 post op left total knee    HPI Bonnie Powers is a 73 y.o. female.  Postop day #15 she is doing well except for some muscle spasms she is on tramadol and methocarbamol   Allergies  Allergen Reactions  . Statins Other (See Comments)    Muscle aches  . Aspirin     ULCERS    Current Outpatient Medications  Medication Sig Dispense Refill  . ALPRAZolam (XANAX) 0.5 MG tablet Take 0.5 mg by mouth daily as needed for anxiety.    Marland Kitchen aspirin EC 81 MG tablet Take 1 tablet (81 mg total) by mouth daily. 90 tablet 3  . azaTHIOprine (IMURAN) 50 MG tablet TAKE 3 TABLETS (150 MG) BY MOUTH DAILY AS DIRECTED (Patient taking differently: Take 150 mg by mouth every morning. TAKE 3 TABLETS (150 MG) BY MOUTH DAILY AS DIRECTED) 270 tablet 3  . Calcium Carb-Cholecalciferol (CALCIUM-VITAMIN D) 600-400 MG-UNIT TABS Take 1 tablet by mouth daily.    . cholecalciferol (VITAMIN D) 1000 UNITS tablet Take 1,000 Units by mouth daily.    . Cyanocobalamin (B-12) 2500 MCG TABS Take 2,500 mcg by mouth daily.    Marland Kitchen docusate sodium (COLACE) 100 MG capsule Take 1 capsule (100 mg total) by mouth 2 (two) times daily. 10 capsule 0  . hydrochlorothiazide (MICROZIDE) 12.5 MG capsule Take 12.5 mg by mouth daily.  6  . HYDROcodone-acetaminophen (NORCO) 7.5-325 MG tablet Take 1 tablet by mouth every 4 (four) hours as needed for severe pain (pain score 7-10). Use tramadol first if pain persists then take hydrocodone 42 tablet 0  . Magnesium 250 MG TABS Take 250 mg by mouth every other day.    . methocarbamol (ROBAXIN) 500 MG tablet Take 1 tablet (500 mg total) by mouth every 6 (six) hours as needed for muscle spasms. 56 tablet 1  . Multiple Vitamin (MULTIVITAMIN WITH MINERALS) TABS tablet Take 1 tablet by mouth daily.    . nitroGLYCERIN (NITROSTAT) 0.4 MG SL tablet Place 1 tablet (0.4  mg total) under the tongue every 5 (five) minutes as needed for chest pain. 25 tablet 3  . polyethylene glycol (MIRALAX / GLYCOLAX) 17 g packet Take 17 g by mouth daily. 14 each 0  . Probiotic Product (PROBIOTIC FORMULA PO) Take 1 tablet by mouth every evening.     . vitamin C (ASCORBIC ACID) 500 MG tablet Take 500 mg by mouth every other day.    . metoprolol tartrate (LOPRESSOR) 25 MG tablet Take 0.5 tablets (12.5 mg total) by mouth 2 (two) times daily. 90 tablet 3   No current facility-administered medications for this visit.       Physical Exam Physical Exam Temperature (!) 97.2 F (36.2 C).  Appearance of incision: Incision is clean dry and intact we removed the staples  The calf was supple and the Homans sign was normal, there is minimal peripheral edema  Assessment and plan The patient is doing well and is in good condition  Follow-up will be 4 weeks  Start PT   10:29 AM Arther Abbott, MD 09/30/2018  Meds ordered this encounter  Medications  . methocarbamol (ROBAXIN) 500 MG tablet    Sig: Take 1 tablet (500 mg total) by mouth every 6 (six) hours as needed for muscle spasms.    Dispense:  56 tablet  Refill:  1

## 2018-10-28 ENCOUNTER — Other Ambulatory Visit: Payer: Self-pay

## 2018-10-28 ENCOUNTER — Ambulatory Visit (INDEPENDENT_AMBULATORY_CARE_PROVIDER_SITE_OTHER): Payer: Medicare HMO | Admitting: Orthopedic Surgery

## 2018-10-28 VITALS — BP 140/74 | HR 74 | Temp 96.4°F | Ht 66.0 in | Wt 193.0 lb

## 2018-10-28 DIAGNOSIS — Z96652 Presence of left artificial knee joint: Secondary | ICD-10-CM

## 2018-10-28 NOTE — Progress Notes (Signed)
Chief Complaint  Patient presents with  . Follow-up    Recheck on left knee replacxement, DOS 09-15-18.    6 weeks after left total knee patient doing well having some soreness in the quadriceps but doing well with a cane walking well knee flexion is 115 degrees  She is wanting to get her right knee done in October I will see her in September to set that up

## 2018-11-04 ENCOUNTER — Other Ambulatory Visit: Payer: Self-pay | Admitting: Cardiovascular Disease

## 2018-11-10 ENCOUNTER — Ambulatory Visit: Payer: Medicare HMO | Admitting: Gastroenterology

## 2018-11-16 ENCOUNTER — Encounter (HOSPITAL_COMMUNITY): Payer: Self-pay

## 2018-11-16 ENCOUNTER — Ambulatory Visit (HOSPITAL_COMMUNITY)
Admission: RE | Admit: 2018-11-16 | Discharge: 2018-11-16 | Disposition: A | Discharge status: Home / Self Care | Payer: Medicare HMO | Source: Ambulatory Visit | Attending: Nurse Practitioner | Admitting: Nurse Practitioner

## 2018-11-16 ENCOUNTER — Other Ambulatory Visit: Payer: Self-pay

## 2018-11-16 DIAGNOSIS — Z1231 Encounter for screening mammogram for malignant neoplasm of breast: Secondary | ICD-10-CM | POA: Diagnosis not present

## 2018-12-10 ENCOUNTER — Ambulatory Visit: Payer: Medicare HMO | Admitting: Cardiovascular Disease

## 2018-12-10 ENCOUNTER — Other Ambulatory Visit: Payer: Self-pay

## 2018-12-10 ENCOUNTER — Encounter: Payer: Self-pay | Admitting: Cardiovascular Disease

## 2018-12-10 VITALS — BP 123/76 | HR 74 | Temp 97.9°F | Ht 66.0 in | Wt 192.0 lb

## 2018-12-10 DIAGNOSIS — I25708 Atherosclerosis of coronary artery bypass graft(s), unspecified, with other forms of angina pectoris: Secondary | ICD-10-CM | POA: Diagnosis not present

## 2018-12-10 DIAGNOSIS — E785 Hyperlipidemia, unspecified: Secondary | ICD-10-CM

## 2018-12-10 DIAGNOSIS — I10 Essential (primary) hypertension: Secondary | ICD-10-CM | POA: Diagnosis not present

## 2018-12-10 DIAGNOSIS — Z951 Presence of aortocoronary bypass graft: Secondary | ICD-10-CM | POA: Diagnosis not present

## 2018-12-10 MED ORDER — NITROGLYCERIN 0.4 MG SL SUBL: 0.4000 mg | SUBLINGUAL_TABLET | SUBLINGUAL | 3 refills | Status: DC | PRN

## 2018-12-10 NOTE — Progress Notes (Signed)
SUBJECTIVE: The patient presents for routine follow-up. She has a history of non-STEMI in November 2015 with multivessel coronary artery disease for which she underwent 3 vessel CABG with LIMA to LAD, SVG to diagonal, and SVG to RCA.  She has been tried on 3 or 4 statins in the past and she is intolerant due to diffuse myalgias.  The patient denies any symptoms of chest pain, palpitations, shortness of breath, lightheadedness, dizziness, leg swelling, orthopnea, PND, and syncope.  She underwent left total knee replacement about 3 months ago and she feels much better after having done that.  She needs a right knee replacement surgery as well.  She told me about the multiple stressors in her life.  The main one is helping to care for her husband who has had a CVA over 2 years ago.   SocHx: Married to [place] who has h/o CVA. She is originally from [place], [place]. Numerous grandchildren in [place], [place], and [place]. Has a daughter [place], [place].  Review of Systems: As per "subjective", otherwise negative.  Allergies  Allergen Reactions  . Statins Other (See Comments)    Muscle aches  . Aspirin     ULCERS    Current Outpatient Medications  Medication Sig Dispense Refill  . ALPRAZolam (XANAX) 0.5 MG tablet Take 0.5 mg by mouth daily as needed for anxiety.    Marland Kitchen aspirin EC 81 MG tablet Take 1 tablet (81 mg total) by mouth daily. 90 tablet 3  . azaTHIOprine (IMURAN) 50 MG tablet TAKE 3 TABLETS (150 MG) BY MOUTH DAILY AS DIRECTED (Patient taking differently: Take 150 mg by mouth every morning. TAKE 3 TABLETS (150 MG) BY MOUTH DAILY AS DIRECTED) 270 tablet 3  . Calcium Carb-Cholecalciferol (CALCIUM-VITAMIN D) 600-400 MG-UNIT TABS Take 1 tablet by mouth daily.    . cholecalciferol (VITAMIN D) 1000 UNITS tablet Take 1,000 Units by mouth daily.    . Cyanocobalamin (B-12) 2500 MCG TABS Take 2,500 mcg by mouth daily.    Marland Kitchen docusate sodium (COLACE) 100 MG capsule Take 1 capsule (100 mg total) by  mouth 2 (two) times daily. 10 capsule 0  . hydrochlorothiazide (MICROZIDE) 12.5 MG capsule Take 12.5 mg by mouth daily.  6  . HYDROcodone-acetaminophen (NORCO) 7.5-325 MG tablet Take 1 tablet by mouth every 4 (four) hours as needed for severe pain (pain score 7-10). Use tramadol first if pain persists then take hydrocodone 42 tablet 0  . Magnesium 250 MG TABS Take 250 mg by mouth every other day.    . methocarbamol (ROBAXIN) 500 MG tablet Take 1 tablet (500 mg total) by mouth every 6 (six) hours as needed for muscle spasms. 56 tablet 1  . metoprolol tartrate (LOPRESSOR) 25 MG tablet TAKE 1/2 TABLET (12.5 MG) BY MOUTH TWICE DAILY 90 tablet 3  . Multiple Vitamin (MULTIVITAMIN WITH MINERALS) TABS tablet Take 1 tablet by mouth daily.    . nitroGLYCERIN (NITROSTAT) 0.4 MG SL tablet Place 1 tablet (0.4 mg total) under the tongue every 5 (five) minutes as needed for chest pain. 25 tablet 3  . polyethylene glycol (MIRALAX / GLYCOLAX) 17 g packet Take 17 g by mouth daily. 14 each 0  . Probiotic Product (PROBIOTIC FORMULA PO) Take 1 tablet by mouth every evening.     . vitamin C (ASCORBIC ACID) 500 MG tablet Take 500 mg by mouth every other day.     No current facility-administered medications for this visit.     Past Medical History:  Diagnosis Date  . Anxiety   . Basal cell cancer    left temple  . Coronary artery disease   . Depression   . Headache   . Hepatitis 1972   ? unknown type  . HTN (hypertension)   . Hx of cataract surgery 09/11/2015  . Hypercholesteremia   . OA (osteoarthritis)   . Otitis media, chronic   . Ulcerative colitis 2001   Last colonoscopy Dr [name] 12/28/09-> pan-UC, normal TI, maintained on Asacol and Imuran    Past Surgical History:  Procedure Laterality Date  . BIOPSY  11/26/2017   Procedure: BIOPSY;  Surgeon: Daneil Dolin, MD;  [facility];  Service: Endoscopy;;  biopsies of normal terminal, ascending, transverse, descending, sigmoid, rectal  .  CARDIAC CATHETERIZATION    . COLONOSCOPY  12/28/09   Dr. [name] colitis  . COLONOSCOPY N/A 02/01/2016   Procedure: COLONOSCOPY;  Surgeon: Daneil Dolin, MD;  [facility];  Service: Endoscopy;  Laterality: N/A;  1015  . COLONOSCOPY N/A 11/26/2017   Dr. [name]: diffuse granular/pale colonic mucosa. couple of tiny erosions in ascending and descending segment. rectum less involved but not completely normal. multiple erosions in TI. segmental bxs with mildly active inflammation in TI, sigmoid colon, inactive chronic proctitis, colitis in descending colon. . next tcs 2 years  . CORONARY ARTERY BYPASS GRAFT N/A 02/25/2014   Procedure: CORONARY ARTERY BYPASS GRAFTING (CABG);  Surgeon: Ivin Poot, MD;  Location: [place];  Service: Open Heart Surgery;  Laterality: N/A;  . EYE SURGERY    . INTRAOPERATIVE TRANSESOPHAGEAL ECHOCARDIOGRAM N/A 02/25/2014   Procedure: INTRAOPERATIVE TRANSESOPHAGEAL ECHOCARDIOGRAM;  Surgeon: Ivin Poot, MD;  Location: [place];  Service: Open Heart Surgery;  Laterality: N/A;  . KNEE SURGERY Right   . LEFT HEART CATHETERIZATION WITH CORONARY ANGIOGRAM N/A 02/23/2014   Procedure: LEFT HEART CATHETERIZATION WITH CORONARY ANGIOGRAM;  Surgeon: Burnell Blanks, MD;  Location: [facility] C[facility];  Service: Cardiovascular;  Laterality: N/A;  . SALPINGOOPHORECTOMY     right ovary, both tubes  . TONSILLECTOMY    . TOTAL KNEE ARTHROPLASTY Left 09/15/2018   Procedure: TOTAL KNEE ARTHROPLASTY;  Surgeon: Carole Civil, MD;  Location: [facility];  Service: Orthopedics;  Laterality: Left;    Social History   Socioeconomic History  . Marital status: Divorced    Spouse name: Not on file  . Number of children: 3  . Years of education: Not on file  . Highest education level: Not on file  Occupational History  . Occupation: retired    Fish farm manager: RETIRED    Comment: [employer]  Social Needs  . Financial resource strain: Patient refused  . Food insecurity     Worry: Patient refused    Inability: Patient refused  . Transportation needs    Medical: Patient refused    Non-medical: Patient refused  Tobacco Use  . Smoking status: Current Some Day Smoker    Packs/day: 1.00    Years: 30.00    Pack years: 30.00    Types: Cigarettes    Last attempt to quit: 02/22/2014    Years since quitting: 4.8  . Smokeless tobacco: Never Used  . Tobacco comment: smokes about 3 cigarettes per week, formerly heavy smoker  Substance and Sexual Activity  . Alcohol use: No    Alcohol/week: 0.0 standard drinks  . Drug use: No  . Sexual activity: Never  Lifestyle  . Physical activity    Days per week: Patient refused  Minutes per session: Patient refused  . Stress: Patient refused  Relationships  . Social Herbalist on phone: Patient refused    Gets together: Patient refused    Attends religious service: Patient refused    Active member of club or organization: Patient refused    Attends meetings of clubs or organizations: Patient refused    Relationship status: Patient refused  . Intimate partner violence    Fear of current or ex partner: Patient refused    Emotionally abused: Patient refused    Physically abused: Patient refused    Forced sexual activity: Patient refused  Other Topics Concern  . Not on file  Social History Narrative   Lost 1 daughter w/ meningitis     Vitals:   12/10/18 0935  BP: 123/76  Pulse: 74  Temp: 97.9 F (36.6 C)  SpO2: 98%  Weight: 192 lb (87.1 kg)  Height: 5' 6"  (1.676 m)    Wt Readings from Last 3 Encounters:  12/10/18 192 lb (87.1 kg)  10/28/18 193 lb (87.5 kg)  09/18/18 212 lb 4.9 oz (96.3 kg)     PHYSICAL EXAM General: NAD HEENT: Normal. Neck: No JVD, no thyromegaly. Lungs: Clear to auscultation bilaterally with normal respiratory effort. CV: Regular rate and rhythm, normal S1/S2, no E9/B2, soft systolic murmur over bilateral upper sternal borders. No pretibial or periankle edema.  No  carotid bruit.   Abdomen: Soft, nontender, no distention.  Neurologic: Alert and oriented.  Psych: Normal affect. Skin: Normal. Musculoskeletal: No gross deformities.    ECG: Reviewed above under Subjective   Labs: Lab Results  Component Value Date/Time   K 3.8 09/16/2018 04:57 AM   BUN 15 09/16/2018 04:57 AM   CREATININE 0.77 09/16/2018 04:57 AM   CREATININE 0.86 12/23/2017 09:52 AM   ALT 11 08/08/2017 11:22 AM   TSH 2.80 03/18/2014 02:17 PM   HGB 10.5 (L) 09/16/2018 04:57 AM     Lipids: Lab Results  Component Value Date/Time   LDLCALC 119 (H) 06/23/2014 09:52 AM   CHOL 185 06/23/2014 09:52 AM   TRIG 78.0 06/23/2014 09:52 AM   HDL 50.10 06/23/2014 09:52 AM       ASSESSMENT AND PLAN:  1.  Coronary disease with history of three-vessel CABG: Symptomatically stable.  Continue aspirin and metoprolol. She has been tried on 3 or 4 statins in the past and she is intolerant due to diffuse myalgias.    She could not afford Repatha.  I will make a referral to lipid clinic.  2. Hypertension: Controlled.  No changes.  3.  Hyperlipidemia: She has been tried on 3 or 4 statins in the past and she is intolerant due to diffuse myalgias.    She could not afford Repatha.  I will make a referral to lipid clinic.    Disposition: Follow up 1 yr   Kate Sable, M.D., F.A.C.C.

## 2018-12-10 NOTE — Patient Instructions (Signed)
Medication Instructions: Your physician recommends that you continue on your current medications as directed. Please refer to the Current Medication list given to you today.  Labwork: None  Procedures/Testing: None  Follow-Up: 1 year with Dr.Koneswaran  Any Additional Special Instructions Will Be Listed Below (If Applicable).   You have been referred to [facility] at the [facility] in [place].They will call you for an apt.   If you need a refill on your cardiac medications before your next appointment, please call your pharmacy.

## 2018-12-15 ENCOUNTER — Ambulatory Visit: Payer: Medicare HMO | Admitting: Gastroenterology

## 2018-12-15 ENCOUNTER — Encounter: Payer: Self-pay | Admitting: Gastroenterology

## 2018-12-15 ENCOUNTER — Other Ambulatory Visit: Payer: Self-pay

## 2018-12-15 VITALS — BP 133/69 | HR 87 | Temp 96.6°F | Ht 66.0 in | Wt 193.4 lb

## 2018-12-15 DIAGNOSIS — K5100 Ulcerative (chronic) pancolitis without complications: Secondary | ICD-10-CM

## 2018-12-15 NOTE — Progress Notes (Signed)
Primary Care Physician: Renee Rival, NP  Primary Gastroenterologist:  Garfield Cornea, MD   Chief Complaint  Patient presents with  . ulcerative pancolitis    doing ok    HPI: Bonnie Powers is a 73 y.o. female here for follow up of UC. She is doing well. Intentional weight loss of 20 pounds due to left TKA in 09/2018 but weight has been stable for 2 months. She has recovered nicely. No recent UC flares. BMs are regular. No abdominal pain. No blood in stool. No N/V. No UGI symptoms. Plans to get rught TKA done in 02/2019.   Last colonoscopy August 2019, diffuse granular/pale colonic mucosa, couple tiny erosions in the ascending and descending segment, rectum less involved but completely normal.  Multiple erosions in the TI.  Segmental biopsies with mildly active inflammation in the TI, sigmoid colon, and active chronic proctitis, colitis of the descending colon.  Next colonoscopy in 2 years.  Current Outpatient Medications  Medication Sig Dispense Refill  . ALPRAZolam (XANAX) 0.5 MG tablet Take 0.5 mg by mouth daily as needed for anxiety.    Marland Kitchen aspirin EC 81 MG tablet Take 1 tablet (81 mg total) by mouth daily. 90 tablet 3  . azaTHIOprine (IMURAN) 50 MG tablet TAKE 3 TABLETS (150 MG) BY MOUTH DAILY AS DIRECTED (Patient taking differently: Take 150 mg by mouth every morning. TAKE 3 TABLETS (150 MG) BY MOUTH DAILY AS DIRECTED) 270 tablet 3  . Calcium Carb-Cholecalciferol (CALCIUM-VITAMIN D) 600-400 MG-UNIT TABS Take 1 tablet by mouth daily.    . cholecalciferol (VITAMIN D) 1000 UNITS tablet Take 1,000 Units by mouth daily.    . Cyanocobalamin (B-12) 2500 MCG TABS Take 2,500 mcg by mouth daily.    Marland Kitchen docusate sodium (COLACE) 100 MG capsule Take 1 capsule (100 mg total) by mouth 2 (two) times daily. 10 capsule 0  . hydrochlorothiazide (MICROZIDE) 12.5 MG capsule Take 12.5 mg by mouth daily.  6  . HYDROcodone-acetaminophen (NORCO) 7.5-325 MG tablet Take 1 tablet by mouth every 4  (four) hours as needed for severe pain (pain score 7-10). Use tramadol first if pain persists then take hydrocodone 42 tablet 0  . Magnesium 250 MG TABS Take 250 mg by mouth every other day.    . methocarbamol (ROBAXIN) 500 MG tablet Take 1 tablet (500 mg total) by mouth every 6 (six) hours as needed for muscle spasms. 56 tablet 1  . Multiple Vitamin (MULTIVITAMIN WITH MINERALS) TABS tablet Take 1 tablet by mouth daily.    . nitroGLYCERIN (NITROSTAT) 0.4 MG SL tablet Place 1 tablet (0.4 mg total) under the tongue every 5 (five) minutes as needed for chest pain. 25 tablet 3  . Probiotic Product (PROBIOTIC FORMULA PO) Take 1 tablet by mouth every evening.     . vitamin C (ASCORBIC ACID) 500 MG tablet Take 500 mg by mouth every other day.     No current facility-administered medications for this visit.     Allergies as of 12/15/2018 - Review Complete 12/15/2018  Allergen Reaction Noted  . Statins Other (See Comments) 03/02/2014  . Aspirin  02/06/2011    ROS:  General: Negative for anorexia, weight loss, fever, chills, fatigue, weakness. ENT: Negative for hoarseness, difficulty swallowing , nasal congestion. CV: Negative for chest pain, angina, palpitations, dyspnea on exertion, peripheral edema.  Respiratory: Negative for dyspnea at rest, dyspnea on exertion, cough, sputum, wheezing.  GI: See history of present illness. GU:  Negative for dysuria, hematuria,  urinary incontinence, urinary frequency, nocturnal urination.  Endo: Negative for unusual weight change.    Physical Examination:   BP 133/69   Pulse 87   Temp (!) 96.6 F (35.9 C) (Temporal)   Ht 5' 6"  (1.676 m)   Wt 193 lb 6.4 oz (87.7 kg)   BMI 31.22 kg/m   General: Well-nourished, well-developed in no acute distress.  Eyes: No icterus. Mouth: Oropharyngeal mucosa moist and pink , no lesions erythema or exudate. Lungs: Clear to auscultation bilaterally.  Heart: Regular rate and rhythm, no murmurs rubs or gallops.   Abdomen: Bowel sounds are normal, nontender, nondistended, no hepatosplenomegaly or masses, no abdominal bruits or hernia , no rebound or guarding.   Extremities: No lower extremity edema. No clubbing or deformities. Neuro: Alert and oriented x 4   Skin: Warm and dry, no jaundice.   Psych: Alert and cooperative, normal mood and affect.  Labs:  Lab Results  Component Value Date   CREATININE 0.77 09/16/2018   BUN 15 09/16/2018   NA 135 09/16/2018   K 3.8 09/16/2018   CL 102 09/16/2018   CO2 25 09/16/2018   Lab Results  Component Value Date   WBC 7.3 09/16/2018   HGB 10.5 (L) 09/16/2018   HCT 33.5 (L) 09/16/2018   MCV 95.7 09/16/2018   PLT 226 09/16/2018     Imaging Studies: Mm 3d Screen Breast Bilateral  Result Date: 11/17/2018 CLINICAL DATA:  Screening. EXAM: DIGITAL SCREENING BILATERAL MAMMOGRAM WITH TOMO AND CAD COMPARISON:  Previous exam(s). ACR Breast Density Category b: There are scattered areas of fibroglandular density. FINDINGS: There are no findings suspicious for malignancy. Images were processed with CAD. IMPRESSION: No mammographic evidence of malignancy. A result letter of this screening mammogram will be mailed directly to the patient. RECOMMENDATION: Screening mammogram in one year. (Code:SM-B-01Y) BI-RADS CATEGORY  1: Negative. Electronically Signed   By: Evangeline Dakin M.D.   On: 11/17/2018 07:55

## 2018-12-15 NOTE — Patient Instructions (Addendum)
Continue Imuran 3 tablets daily.  Call if you have any flares of your colitis.  We will see you back in six months.  Next colonoscopy due August 2021.

## 2018-12-15 NOTE — Assessment & Plan Note (Signed)
Doing well. Continue Imuran. Return to the office in six months.

## 2018-12-23 ENCOUNTER — Other Ambulatory Visit: Payer: Self-pay

## 2018-12-23 ENCOUNTER — Ambulatory Visit: Payer: Medicare HMO

## 2018-12-23 ENCOUNTER — Ambulatory Visit: Payer: Medicare HMO | Admitting: Orthopedic Surgery

## 2018-12-23 ENCOUNTER — Other Ambulatory Visit: Payer: Self-pay | Admitting: Orthopedic Surgery

## 2018-12-23 ENCOUNTER — Encounter: Payer: Self-pay | Admitting: Orthopedic Surgery

## 2018-12-23 VITALS — BP 149/81 | HR 70 | Temp 97.1°F | Ht 66.0 in | Wt 188.0 lb

## 2018-12-23 DIAGNOSIS — M1710 Unilateral primary osteoarthritis, unspecified knee: Secondary | ICD-10-CM

## 2018-12-23 DIAGNOSIS — M1711 Unilateral primary osteoarthritis, right knee: Secondary | ICD-10-CM

## 2018-12-23 DIAGNOSIS — R2241 Localized swelling, mass and lump, right lower limb: Secondary | ICD-10-CM | POA: Diagnosis not present

## 2018-12-23 DIAGNOSIS — Z96652 Presence of left artificial knee joint: Secondary | ICD-10-CM | POA: Diagnosis not present

## 2018-12-23 NOTE — Progress Notes (Signed)
Bonnie Powers  12/23/2018  HISTORY SECTION :  Chief Complaint  Patient presents with  . Knee Pain    Recheck on left total knee, DOS 09-15-18.  Marland Kitchen Knee Problem    Pain right knee chronic   HPI The patient presents for evaluation of chronic pain right knee.  Location medial duration 6 months or more quality dull ache severity 8 out of 10 associated with decreased range of motion stiffness weakness trouble getting out of a chair  Patient did well with left total knee has some discomfort when getting out of a low chair  Review of Systems  Constitutional: Negative for chills and fever.  Musculoskeletal: Positive for joint pain.  Neurological: Negative for tingling, focal weakness and weakness.  All other systems reviewed and are negative.    has a past medical history of Anxiety, Basal cell cancer, Coronary artery disease, Depression, Headache, Hepatitis (1972), HTN (hypertension), cataract surgery (09/11/2015), Hypercholesteremia, OA (osteoarthritis), Otitis media, chronic, and Ulcerative colitis (2001).   Past Surgical History:  Procedure Laterality Date  . BIOPSY  11/26/2017   Procedure: BIOPSY;  Surgeon: Daneil Dolin, MD;  [facility];  Service: Endoscopy;;  biopsies of normal terminal, ascending, transverse, descending, sigmoid, rectal  . CARDIAC CATHETERIZATION    . COLONOSCOPY  12/28/09   Dr. [name] colitis  . COLONOSCOPY N/A 02/01/2016   Procedure: COLONOSCOPY;  Surgeon: Daneil Dolin, MD;  [facility];  Service: Endoscopy;  Laterality: N/A;  1015  . COLONOSCOPY N/A 11/26/2017   Dr. [name]: diffuse granular/pale colonic mucosa. couple of tiny erosions in ascending and descending segment. rectum less involved but not completely normal. multiple erosions in TI. segmental bxs with mildly active inflammation in TI, sigmoid colon, inactive chronic proctitis, colitis in descending colon. . next tcs 2 years  . CORONARY ARTERY BYPASS GRAFT N/A  02/25/2014   Procedure: CORONARY ARTERY BYPASS GRAFTING (CABG);  Surgeon: Ivin Poot, MD;  Location: [place];  Service: Open Heart Surgery;  Laterality: N/A;  . EYE SURGERY    . INTRAOPERATIVE TRANSESOPHAGEAL ECHOCARDIOGRAM N/A 02/25/2014   Procedure: INTRAOPERATIVE TRANSESOPHAGEAL ECHOCARDIOGRAM;  Surgeon: Ivin Poot, MD;  Location: [place];  Service: Open Heart Surgery;  Laterality: N/A;  . KNEE SURGERY Right   . LEFT HEART CATHETERIZATION WITH CORONARY ANGIOGRAM N/A 02/23/2014   Procedure: LEFT HEART CATHETERIZATION WITH CORONARY ANGIOGRAM;  Surgeon: Burnell Blanks, MD;  Location: [facility] CATH LAB;  Service: Cardiovascular;  Laterality: N/A;  . SALPINGOOPHORECTOMY     right ovary, both tubes  . TONSILLECTOMY    . TOTAL KNEE ARTHROPLASTY Left 09/15/2018   Procedure: TOTAL KNEE ARTHROPLASTY;  Surgeon: Carole Civil, MD;  Location: [facility];  Service: Orthopedics;  Laterality: Left;     Allergies  Allergen Reactions  . Statins Other (See Comments)    Muscle aches  . Aspirin     ULCERS     Current Outpatient Medications:  .  ALPRAZolam (XANAX) 0.5 MG tablet, Take 0.5 mg by mouth daily as needed for anxiety., Disp: , Rfl:  .  aspirin EC 81 MG tablet, Take 1 tablet (81 mg total) by mouth daily., Disp: 90 tablet, Rfl: 3 .  azaTHIOprine (IMURAN) 50 MG tablet, TAKE 3 TABLETS (150 MG) BY MOUTH DAILY AS DIRECTED (Patient taking differently: Take 150 mg by mouth every morning. TAKE 3 TABLETS (150 MG) BY MOUTH DAILY AS DIRECTED), Disp: 270 tablet, Rfl: 3 .  Calcium Carb-Cholecalciferol (CALCIUM-VITAMIN D) 600-400 MG-UNIT TABS, Take  1 tablet by mouth daily., Disp: , Rfl:  .  cholecalciferol (VITAMIN D) 1000 UNITS tablet, Take 1,000 Units by mouth daily., Disp: , Rfl:  .  Cyanocobalamin (B-12) 2500 MCG TABS, Take 2,500 mcg by mouth daily., Disp: , Rfl:  .  docusate sodium (COLACE) 100 MG capsule, Take 1 capsule (100 mg total) by mouth 2 (two) times daily., Disp: 10 capsule, Rfl: 0 .   hydrochlorothiazide (MICROZIDE) 12.5 MG capsule, Take 12.5 mg by mouth daily., Disp: , Rfl: 6 .  HYDROcodone-acetaminophen (NORCO) 7.5-325 MG tablet, Take 1 tablet by mouth every 4 (four) hours as needed for severe pain (pain score 7-10). Use tramadol first if pain persists then take hydrocodone, Disp: 42 tablet, Rfl: 0 .  Magnesium 250 MG TABS, Take 250 mg by mouth every other day., Disp: , Rfl:  .  methocarbamol (ROBAXIN) 500 MG tablet, Take 1 tablet (500 mg total) by mouth every 6 (six) hours as needed for muscle spasms., Disp: 56 tablet, Rfl: 1 .  Multiple Vitamin (MULTIVITAMIN WITH MINERALS) TABS tablet, Take 1 tablet by mouth daily., Disp: , Rfl:  .  nitroGLYCERIN (NITROSTAT) 0.4 MG SL tablet, Place 1 tablet (0.4 mg total) under the tongue every 5 (five) minutes as needed for chest pain., Disp: 25 tablet, Rfl: 3 .  Probiotic Product (PROBIOTIC FORMULA PO), Take 1 tablet by mouth every evening. , Disp: , Rfl:  .  vitamin C (ASCORBIC ACID) 500 MG tablet, Take 500 mg by mouth every other day., Disp: , Rfl:    PHYSICAL EXAM SECTION: 1) BP (!) 149/81   Pulse 70   Temp (!) 97.1 F (36.2 C)   Ht 5' 6"  (1.676 m)   Wt 188 lb (85.3 kg)   BMI 30.34 kg/m   Body mass index is 30.34 kg/m. General appearance: Well-developed well-nourished no gross deformities  2) Cardiovascular normal pulse and perfusion in the lower extremities normal color without edema  3) Neurologically deep tendon reflexes are equal and normal, no sensation loss or deficits no pathologic reflexes  4) Psychological: Awake alert and oriented x3 mood and affect normal  5) Skin no lacerations or ulcerations no nodularity no palpable masses, no erythema or nodularity  6) Musculoskeletal:   Left knee well-healed incision no tenderness 120 degrees range of motion good quad strength stable in all positions  Right knee tenderness medial joint line 125 degrees of flexion small flexion contracture good quad strength stable in all  positions   MEDICAL DECISION SECTION:  Encounter Diagnosis  Name Primary?  . Primary localized osteoarthritis of knee left Yes    Imaging Ordered x-ray  See report x-ray shows varus severe arthritis medial compartment right knee Plan:  (Rx., Inj., surg., Frx, MRI/CT, XR:2)  Right total knee  The procedure has been fully reviewed with the patient; The risks and benefits of surgery have been discussed and explained and understood. Alternative treatment has also been reviewed, questions were encouraged and answered. The postoperative plan is also been reviewed.   11:13 AM Arther Abbott, MD  12/23/2018

## 2018-12-23 NOTE — Patient Instructions (Signed)
You have decided to proceed with knee replacement surgery. You have decided not to continue with nonoperative measures such as but not limited to oral medication, weight loss, activity modification, physical therapy, bracing, or injection.  We will perform the procedure commonly known as total knee replacement. Some of the risks associated with knee replacement surgery include but are not limited to Bleeding Infection Swelling Stiffness Blood clot Pulmonary embolism  Loosening of the implant Pain that persists even after surgery  Infection is especially devastating complication of knee surgery although rare. If infection does occur your implant will usually have to be removed and several surgeries and antibiotics will be needed to eradicate the infection prior to performing a repeat replacement.   In some cases amputation is required to eradicate the infection. In other rare cases a knee fusion is needed   In compliance with recent [place] law in federal regulation regarding opioid use and abuse and addiction, we will taper (stop) opioid medication after 2 weeks.  If you're not comfortable with these risks and would like to continue with nonoperative treatment please let Dr. Aline Brochure know prior to your surgery.

## 2019-02-01 ENCOUNTER — Telehealth: Payer: Self-pay | Admitting: Orthopedic Surgery
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
## 2019-02-02 NOTE — Telephone Encounter (Signed)
See fri

## 2019-02-02 NOTE — Telephone Encounter (Signed)
Scheduled per patient's call back. Aware of appointment.

## 2019-02-02 NOTE — Telephone Encounter (Signed)
Called back to patient to offer appointment as per Dr Ruthe Mannan response. Left message.

## 2019-02-05 ENCOUNTER — Other Ambulatory Visit: Payer: Self-pay

## 2019-02-05 ENCOUNTER — Ambulatory Visit: Payer: Medicare HMO

## 2019-02-05 ENCOUNTER — Ambulatory Visit: Payer: Medicare HMO | Admitting: Orthopedic Surgery

## 2019-02-05 ENCOUNTER — Encounter: Payer: Self-pay | Admitting: Orthopedic Surgery

## 2019-02-05 VITALS — Ht 66.0 in | Wt 190.0 lb

## 2019-02-05 DIAGNOSIS — M25562 Pain in left knee: Secondary | ICD-10-CM
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
## 2019-02-16 ENCOUNTER — Other Ambulatory Visit: Payer: Self-pay | Admitting: Orthopedic Surgery

## 2019-02-16 DIAGNOSIS — M1711 Unilateral primary osteoarthritis, right knee: Secondary | ICD-10-CM

## 2019-02-17 NOTE — Patient Instructions (Signed)
Bonnie Powers  02/17/2019     @PREFPERIOPPHARMACY @   Your procedure is scheduled on  02/23/2019 .  Report to [facility] at  [phone]  A.M.  Call this number if you have problems the morning of surgery:  [phone]   Remember:  Do not eat or drink after midnight.                       Take these medicines the morning of surgery with A SIP OF WATER  Xanax(if needed), imuran, metoprolol.    Do not wear jewelry, make-up or nail polish.  Do not wear lotions, powders, or perfumes. Please wear deodorant and brush your teeth.  Do not shave 48 hours prior to surgery.  Men may shave face and neck.  Do not bring valuables to the hospital.  [facility] is not responsible for any belongings or valuables.  Contacts, dentures or bridgework may not be worn into surgery.  Leave your suitcase in the car.  After surgery it may be brought to your room.  For patients admitted to the hospital, discharge time will be determined by your treatment team.  Patients discharged the day of surgery will not be allowed to drive home.   Name and phone number of your driver:   family Special instructions: None  Please read over the following fact sheets that you were given. Pain Booklet, Coughing and Deep Breathing, Blood Transfusion Information, Total Joint Packet, MRSA Information, Surgical Site Infection Prevention, Anesthesia Post-op Instructions and Care and Recovery After Surgery       Total Knee Replacement, Care After This sheet gives you information about how to care for yourself after your procedure. Your health care provider may also give you more specific instructions. If you have problems or questions, contact your health care provider. What can I expect after the procedure? After the procedure, it is common to have:  Pain.  Swelling.  A small amount of blood or clear fluid coming from your incision.  Limited range of motion. Follow these instructions at home:  Medicines  Take over-the-counter and prescription medicines only as told by your health care provider.  If you were prescribed a blood thinner (anticoagulant), take it as told by your health care provider.  Ask your health care provider if the medicine prescribed to you: ? Requires you to avoid driving or using heavy machinery. ? Can cause constipation. You may need to take actions to prevent or treat constipation, such as:  Drink enough fluid to keep your urine pale yellow.  Take over-the-counter or prescription medicines.  Eat foods that are high in fiber, such as beans, whole grains, and fresh fruits and vegetables.  Limit foods that are high in fat and processed sugars, such as fried or sweet foods. Bathing  Do not take baths, swim, or use a hot tub until your health care provider approves. Ask your health care provider if you may take showers. You may only be allowed to take sponge baths.  Keep your bandage (dressing) dry until your health care provider says it can be removed. Incision care and drain care   Follow instructions from your health care provider about how to take care of your incision. Make sure you: ? Wash your hands with soap and water before and after you change your dressing. If soap and water are not available, use hand sanitizer. ? Change your dressing as told by  your health care provider. ? Leave stitches (sutures), skin glue, or adhesive strips in place. These skin closures may need to stay in place for 2 weeks or longer. If adhesive strip edges start to loosen and curl up, you may trim the loose edges. Do not remove adhesive strips completely unless your health care provider tells you to do that.  Check your incision area and drain site every day for signs of infection. Check for: ? More redness, swelling, or pain. ? More fluid or blood. ? Warmth. ? Pus or a bad smell.  If you have a drain, follow instructions from your health care provider about caring  for it. Managing pain, stiffness, and swelling      If directed, put ice on your knee. ? Put ice in a plastic bag or use the icing device (cold flow pad or cryocuff) that you were given. Follow instructions from your health care provider about how to use the icing device. ? Place a towel between your skin and the bag or between your skin and the icing device. ? Leave the ice on for 20 minutes, 2-3 times per day.  If directed, apply heat to the affected area before you exercise. Use the heat source that your health care provider recommends, such as a moist heat pack or a heating pad. ? Place a towel between your skin and the heat source. ? Leave the heat on for 20-30 minutes. ? Remove the heat if your skin turns bright red. This is especially important if you are unable to feel pain, heat, or cold. You may have a greater risk of getting burned.  Move your toes often to avoid stiffness and to lessen swelling.  Raise (elevate) your leg above the level of your heart while you are sitting or lying down. ? Use several pillows to keep your leg straight. ? Do not put a pillow just under the knee. If the knee is bent for a long time, this may lead to stiffness.  Wear elastic knee support as told by your health care provider. Activity  Rest as told by your health care provider.  Avoid sitting for a long time without moving. Get up to take short walks every 1-2 hours. This is important to improve blood flow and breathing. Ask for help if you feel weak or unsteady.  Ask your health care provider what activities are safe for you.  Avoid high-impact activities, including running, jumping rope, and jumping jacks.  Do not play contact sports until your health care provider approves.  Do exercises as told by your physical therapist.  If you have been sent home with a continuous passive motion machine, use it as told by your health care provider. Safety   Do not use your leg to support your  body weight until your health care provider approves. Use crutches or a walker as told by your health care provider.  Do not drive until your health care provider approves. Ask your health care provider when it is safe to drive. General instructions  Do not use any products that contain nicotine or tobacco, such as cigarettes, e-cigarettes, and chewing tobacco. These can delay healing after surgery. If you need help quitting, ask your health care provider.  Wear compression stockings as told by your health care provider.  Tell your health care provider if you plan to have dental work. Also, tell your dentist about your joint replacement.  Keep all follow-up visits as told by your health care provider.  This is important. Contact a health care provider if you have:  More redness, swelling, or pain around your incision or drain.  More fluid or blood coming from your incision or drain.  Pus or a bad smell coming from your incision or drain.  Warmth on your incision or drain site.  A fever.  An incision that breaks open.  Knee pain that does not go away.  Range of motion in your knee that is getting worse.  A prosthesis that feels loose. Get help right away if you have:  Pain or swelling in your calf or thigh.  Shortness of breath or difficulty breathing.  Chest pain. Summary  After the procedure, it is common to have pain and swelling, blood or fluid coming from your incision, and limited range of motion.  Follow instructions from your health care provider about how to take care of your incision.  Use crutches or a walker as told by your health care provider.  If you were prescribed a blood thinner (anticoagulant), take it as told by your health care provider.  Keep all follow-up visits as told by your health care provider. This is important. This information is not intended to replace advice given to you by your health care provider. Make sure you discuss any questions  you have with your health care provider. Document Released: 10/12/2004 Document Revised: 04/17/2018 Document Reviewed: 11/06/2017 Elsevier Interactive Patient Education  [place] Anesthesia, Adult, Care After This sheet gives you information about how to care for yourself after your procedure. Your health care provider may also give you more specific instructions. If you have problems or questions, contact your health care provider. What can I expect after the procedure? After the procedure, the following side effects are common:  Pain or discomfort at the IV site.  Nausea.  Vomiting.  Sore throat.  Trouble concentrating.  Feeling cold or chills.  Weak or tired.  Sleepiness and fatigue.  Soreness and body aches. These side effects can affect parts of the body that were not involved in surgery. Follow these instructions at home:  For at least 24 hours after the procedure:  Have a responsible adult stay with you. It is important to have someone help care for you until you are awake and alert.  Rest as needed.  Do not: ? Participate in activities in which you could fall or become injured. ? Drive. ? Use heavy machinery. ? Drink alcohol. ? Take sleeping pills or medicines that cause drowsiness. ? Make important decisions or sign legal documents. ? Take care of children on your own. Eating and drinking  Follow any instructions from your health care provider about eating or drinking restrictions.  When you feel hungry, start by eating small amounts of foods that are soft and easy to digest (bland), such as toast. Gradually return to your regular diet.  Drink enough fluid to keep your urine pale yellow.  If you vomit, rehydrate by drinking water, juice, or clear broth. General instructions  If you have sleep apnea, surgery and certain medicines can increase your risk for breathing problems. Follow instructions from your health care provider about  wearing your sleep device: ? Anytime you are sleeping, including during daytime naps. ? While taking prescription pain medicines, sleeping medicines, or medicines that make you drowsy.  Return to your normal activities as told by your health care provider. Ask your health care provider what activities are safe for you.  Take over-the-counter and prescription medicines  only as told by your health care provider.  If you smoke, do not smoke without supervision.  Keep all follow-up visits as told by your health care provider. This is important. Contact a health care provider if:  You have nausea or vomiting that does not get better with medicine.  You cannot eat or drink without vomiting.  You have pain that does not get better with medicine.  You are unable to pass urine.  You develop a skin rash.  You have a fever.  You have redness around your IV site that gets worse. Get help right away if:  You have difficulty breathing.  You have chest pain.  You have blood in your urine or stool, or you vomit blood. Summary  After the procedure, it is common to have a sore throat or nausea. It is also common to feel tired.  Have a responsible adult stay with you for the first 24 hours after general anesthesia. It is important to have someone help care for you until you are awake and alert.  When you feel hungry, start by eating small amounts of foods that are soft and easy to digest (bland), such as toast. Gradually return to your regular diet.  Drink enough fluid to keep your urine pale yellow.  Return to your normal activities as told by your health care provider. Ask your health care provider what activities are safe for you. This information is not intended to replace advice given to you by your health care provider. Make sure you discuss any questions you have with your health care provider. Document Released: 07/01/2000 Document Revised: 03/28/2017 Document Reviewed: 11/08/2016  Elsevier Patient Education  [place].  Spinal Anesthesia and Epidural Anesthesia, Care After This sheet gives you information about how to care for yourself after your procedure. Your doctor may also give you more specific instructions. If you have problems or questions, call your doctor. Follow these instructions at home: For at least 24 hours after the procedure:   Have a responsible adult stay with you. It is important to have someone help care for you until you are awake and alert.  Rest as needed.  Do not do activities where you could fall or get hurt (injured).  Do not drive.  Do not use heavy machinery.  Do not drink alcohol.  Do not take sleeping pills or medicines that make you sleepy (drowsy).  Do not make important decisions.  Do not sign legal documents.  Do not take care of children on your own. Eating and drinking  If you throw up (vomit), drink water, juice, or soup when nausea and vomiting stop.  Drink enough fluid to keep your pee (urine) pale yellow.  Make sure you do not feel like throwing up (nauseous) before you eat solid foods.  Follow the diet that your doctor recommends. General instructions  Return to your normal activities as told by your doctor. Ask your doctor what activities are safe for you.  Take over-the-counter and prescription medicines only as told by your doctor.  If you have sleep apnea, surgery and certain medicines can raise your risk for breathing problems. Follow instructions from your doctor about when to wear your sleep device. Your doctor may tell you to wear your sleep device: ? Anytime you are sleeping, including during daytime naps. ? While taking prescription pain medicines, sleeping pills, or medicines that make you sleepy.  Do not use any products that contain nicotine or tobacco. This includes cigarettes and  e-cigarettes. ? If you need help quitting, ask your doctor. ? If you smoke, do not smoke by yourself.  Make sure someone is nearby in case you need help.  Keep all follow-up visits as told by your doctor. This is important. Contact a doctor if:  It has been more than one day since your procedure and you feel like throwing up.  It has been more than one day since your procedure and you throw up.  You have a rash. Get help right away if:  You have a fever.  You have a headache that lasts a long time.  You have a very bad headache.  Your vision is blurry.  You see two of a single object (double vision).  You are dizzy or light-headed.  You faint.  Your arms or legs tingle, feel weak, or get numb.  You have trouble breathing.  You cannot pee (urinate). Summary  After the procedure, have a responsible adult stay with you at home until you are fully awake and alert.  Do not do activities that might get you injured. Do not drive, use heavy machinery, drink alcohol, or make important decisions for 24 hours after the procedure.  Take medicines as told by your doctor. Do not use products that contain nicotine or tobacco.  Get help right away if you have a fever, blurry vision, difficulty breathing or passing urine, or weakness or numbness in arms or legs. This information is not intended to replace advice given to you by your health care provider. Make sure you discuss any questions you have with your health care provider. Document Released: 07/17/2015 Document Revised: 03/07/2017 Document Reviewed: 07/17/2015 Elsevier Patient Education  2020 [employer]. How to Use Chlorhexidine for Bathing Chlorhexidine gluconate (CHG) is a germ-killing (antiseptic) solution that is used to clean the skin. It can get rid of the bacteria that normally live on the skin and can keep them away for about 24 hours. To clean your skin with CHG, you may be given:  A CHG solution to use in the shower or as part of a sponge bath.  A prepackaged cloth that contains CHG. Cleaning your skin with CHG may  help lower the risk for infection:  While you are staying in the intensive care unit of the hospital.  If you have a vascular access, such as a central line, to provide short-term or long-term access to your veins.  If you have a catheter to drain urine from your bladder.  If you are on a ventilator. A ventilator is a machine that helps you breathe by moving air in and out of your lungs.  After surgery. What are the risks? Risks of using CHG include:  A skin reaction.  Hearing loss, if CHG gets in your ears.  Eye injury, if CHG gets in your eyes and is not rinsed out.  The CHG product catching fire. Make sure that you avoid smoking and flames after applying CHG to your skin. Do not use CHG:  If you have a chlorhexidine allergy or have previously reacted to chlorhexidine.  On babies younger than 22 months of age. How to use CHG solution  Use CHG only as told by your health care provider, and follow the instructions on the label.  Use the full amount of CHG as directed. Usually, this is one bottle. During a shower Follow these steps when using CHG solution during a shower (unless your health care provider gives you different instructions): 1. Start the shower.  2. Use your normal soap and shampoo to wash your face and hair. 3. Turn off the shower or move out of the shower stream. 4. Pour the CHG onto a clean washcloth. Do not use any type of brush or rough-edged sponge. 5. Starting at your neck, lather your body down to your toes. Make sure you follow these instructions: ? If you will be having surgery, pay special attention to the part of your body where you will be having surgery. Scrub this area for at least 1 minute. ? Do not use CHG on your head or face. If the solution gets into your ears or eyes, rinse them well with water. ? Avoid your genital area. ? Avoid any areas of skin that have broken skin, cuts, or scrapes. ? Scrub your back and under your arms. Make sure to  wash skin folds. 6. Let the lather sit on your skin for 1-2 minutes or as long as told by your health care provider. 7. Thoroughly rinse your entire body in the shower. Make sure that all body creases and crevices are rinsed well. 8. Dry off with a clean towel. Do not put any substances on your body afterward-such as powder, lotion, or perfume-unless you are told to do so by your health care provider. Only use lotions that are recommended by the manufacturer. 9. Put on clean clothes or pajamas. 10. If it is the night before your surgery, sleep in clean sheets.  During a sponge bath Follow these steps when using CHG solution during a sponge bath (unless your health care provider gives you different instructions): 1. Use your normal soap and shampoo to wash your face and hair. 2. Pour the CHG onto a clean washcloth. 3. Starting at your neck, lather your body down to your toes. Make sure you follow these instructions: ? If you will be having surgery, pay special attention to the part of your body where you will be having surgery. Scrub this area for at least 1 minute. ? Do not use CHG on your head or face. If the solution gets into your ears or eyes, rinse them well with water. ? Avoid your genital area. ? Avoid any areas of skin that have broken skin, cuts, or scrapes. ? Scrub your back and under your arms. Make sure to wash skin folds. 4. Let the lather sit on your skin for 1-2 minutes or as long as told by your health care provider. 5. Using a different clean, wet washcloth, thoroughly rinse your entire body. Make sure that all body creases and crevices are rinsed well. 6. Dry off with a clean towel. Do not put any substances on your body afterward-such as powder, lotion, or perfume-unless you are told to do so by your health care provider. Only use lotions that are recommended by the manufacturer. 7. Put on clean clothes or pajamas. 8. If it is the night before your surgery, sleep in clean  sheets. How to use CHG prepackaged cloths  Only use CHG cloths as told by your health care provider, and follow the instructions on the label.  Use the CHG cloth on clean, dry skin.  Do not use the CHG cloth on your head or face unless your health care provider tells you to.  When washing with the CHG cloth: ? Avoid your genital area. ? Avoid any areas of skin that have broken skin, cuts, or scrapes. Before surgery Follow these steps when using a CHG cloth to clean before surgery (unless  your health care provider gives you different instructions): 1. Using the CHG cloth, vigorously scrub the part of your body where you will be having surgery. Scrub using a back-and-forth motion for 3 minutes. The area on your body should be completely wet with CHG when you are done scrubbing. 2. Do not rinse. Discard the cloth and let the area air-dry. Do not put any substances on the area afterward, such as powder, lotion, or perfume. 3. Put on clean clothes or pajamas. 4. If it is the night before your surgery, sleep in clean sheets.  For general bathing Follow these steps when using CHG cloths for general bathing (unless your health care provider gives you different instructions). 1. Use a separate CHG cloth for each area of your body. Make sure you wash between any folds of skin and between your fingers and toes. Wash your body in the following order, switching to a new cloth after each step: ? The front of your neck, shoulders, and chest. ? Both of your arms, under your arms, and your hands. ? Your stomach and groin area, avoiding the genitals. ? Your right leg and foot. ? Your left leg and foot. ? The back of your neck, your back, and your buttocks. 2. Do not rinse. Discard the cloth and let the area air-dry. Do not put any substances on your body afterward-such as powder, lotion, or perfume-unless you are told to do so by your health care provider. Only use lotions that are recommended by the  manufacturer. 3. Put on clean clothes or pajamas. Contact a health care provider if:  Your skin gets irritated after scrubbing.  You have questions about using your solution or cloth. Get help right away if:  Your eyes become very red or swollen.  Your eyes itch badly.  Your skin itches badly and is red or swollen.  Your hearing changes.  You have trouble seeing.  You have swelling or tingling in your mouth or throat.  You have trouble breathing.  You swallow any chlorhexidine. Summary  Chlorhexidine gluconate (CHG) is a germ-killing (antiseptic) solution that is used to clean the skin. Cleaning your skin with CHG may help to lower your risk for infection.  You may be given CHG to use for bathing. It may be in a bottle or in a prepackaged cloth to use on your skin. Carefully follow your health care provider's instructions and the instructions on the product label.  Do not use CHG if you have a chlorhexidine allergy.  Contact your health care provider if your skin gets irritated after scrubbing. This information is not intended to replace advice given to you by your health care provider. Make sure you discuss any questions you have with your health care provider. Document Released: 12/18/2011 Document Revised: 06/11/2018 Document Reviewed: 02/20/2017 Elsevier Patient Education  2020 [employer].

## 2019-02-18 ENCOUNTER — Other Ambulatory Visit: Payer: Self-pay

## 2019-02-19 ENCOUNTER — Encounter (HOSPITAL_COMMUNITY): Payer: Self-pay

## 2019-02-19 ENCOUNTER — Other Ambulatory Visit: Payer: Self-pay

## 2019-02-19 ENCOUNTER — Encounter (HOSPITAL_COMMUNITY)
Admission: RE | Admit: 2019-02-19 | Discharge: 2019-02-19 | Disposition: A | Discharge status: Home / Self Care | Payer: Medicare HMO | Source: Ambulatory Visit | Attending: Orthopedic Surgery | Admitting: Orthopedic Surgery

## 2019-02-19 ENCOUNTER — Other Ambulatory Visit (HOSPITAL_COMMUNITY)
Admission: RE | Admit: 2019-02-19 | Discharge: 2019-02-19 | Disposition: A | Discharge status: Home / Self Care | Payer: Medicare HMO | Source: Ambulatory Visit | Attending: Orthopedic Surgery | Admitting: Orthopedic Surgery

## 2019-02-19 DIAGNOSIS — Z01812 Encounter for preprocedural laboratory examination: Secondary | ICD-10-CM | POA: Insufficient documentation

## 2019-02-19 DIAGNOSIS — Z01810 Encounter for preprocedural cardiovascular examination: Secondary | ICD-10-CM | POA: Diagnosis not present

## 2019-02-19 DIAGNOSIS — Z20828 Contact with and (suspected) exposure to other viral communicable diseases: Secondary | ICD-10-CM | POA: Diagnosis not present

## 2019-02-19 HISTORY — DX: Insomnia, unspecified: G47.00

## 2019-02-19 LAB — BASIC METABOLIC PANEL
Anion gap: 10 (ref 5–15)
BUN: 16 mg/dL (ref 8–23)
CO2: 25 mmol/L (ref 22–32)
Calcium: 10.1 mg/dL (ref 8.9–10.3)
Chloride: 103 mmol/L (ref 98–111)
Creatinine, Ser: 0.89 mg/dL (ref 0.44–1.00)
GFR calc Af Amer: >60 mL/min (ref >60)
GFR calc non Af Amer: >60 mL/min (ref >60)
Glucose, Bld: 103 mg/dL — ABNORMAL HIGH (ref 70–99)
Potassium: 3.8 mmol/L (ref 3.5–5.1)
Sodium: 138 mmol/L (ref 135–145)

## 2019-02-19 LAB — CBC WITH DIFFERENTIAL/PLATELET
Abs Immature Granulocytes: 0.01 10*3/uL (ref 0.00–0.07)
Basophils Absolute: 0 10*3/uL (ref 0.0–0.1)
Basophils Relative: 1 %
Eosinophils Absolute: 0.1 10*3/uL (ref 0.0–0.5)
Eosinophils Relative: 2 %
HCT: 37.3 % (ref 36.0–46.0)
Hemoglobin: 11.6 g/dL — ABNORMAL LOW (ref 12.0–15.0)
Immature Granulocytes: 0 %
Lymphocytes Relative: 25 %
Lymphs Abs: 1.2 10*3/uL (ref 0.7–4.0)
MCH: 29.3 pg (ref 26.0–34.0)
MCHC: 31.1 g/dL (ref 30.0–36.0)
MCV: 94.2 fL (ref 80.0–100.0)
Monocytes Absolute: 0.4 10*3/uL (ref 0.1–1.0)
Monocytes Relative: 8 %
Neutro Abs: 3 10*3/uL (ref 1.7–7.7)
Neutrophils Relative %: 64 %
Platelets: 273 10*3/uL (ref 150–400)
RBC: 3.96 MIL/uL (ref 3.87–5.11)
RDW: 16.4 % — ABNORMAL HIGH (ref 11.5–15.5)
WBC: 4.6 10*3/uL (ref 4.0–10.5)
nRBC: 0 % (ref 0.0–0.2)

## 2019-02-19 LAB — TYPE AND SCREEN
ABO/RH(D): A POS
Antibody Screen: NEGATIVE

## 2019-02-19 LAB — PREPARE RBC (CROSSMATCH)

## 2019-02-19 LAB — SARS CORONAVIRUS 2 (TAT 6-24 HRS): SARS Coronavirus 2: NEGATIVE

## 2019-02-19 LAB — SURGICAL PCR SCREEN
MRSA, PCR: NEGATIVE
Staphylococcus aureus: NEGATIVE

## 2019-02-22 NOTE — H&P (Signed)
HISTORY SECTION :       Chief Complaint  Patient presents with  .        . Knee Problem      Pain right knee chronic   HPI The patient presents for evaluation of chronic pain right knee.  Location medial duration 6 months or more quality dull ache severity 8 out of 10 associated with decreased range of motion stiffness weakness trouble getting out of a chair   Patient did well with left total knee has some discomfort when getting out of a low chair   Review of Systems  Constitutional: Negative for chills and fever.  Musculoskeletal: Positive for joint pain.  Neurological: Negative for tingling, focal weakness and weakness.  All other systems reviewed and are negative.      has a past medical history of Anxiety, Basal cell cancer, Coronary artery disease, Depression, Headache, Hepatitis (1972), HTN (hypertension), cataract surgery (09/11/2015), Hypercholesteremia, OA (osteoarthritis), Otitis media, chronic, and Ulcerative colitis (2001).         Past Surgical History:  Procedure Laterality Date  . BIOPSY   11/26/2017    Procedure: BIOPSY;  Surgeon: Daneil Dolin, MD;  Location: [facility];  Service: Endoscopy;;  biopsies of normal terminal, ascending, transverse, descending, sigmoid, rectal  . CARDIAC CATHETERIZATION      . COLONOSCOPY   12/28/09    Dr. [name] colitis  . COLONOSCOPY N/A 02/01/2016    Procedure: COLONOSCOPY;  Surgeon: Daneil Dolin, MD;  Location: [facility];  Service: Endoscopy;  Laterality: N/A;  1015  . COLONOSCOPY N/A 11/26/2017    Dr. [name]: diffuse granular/pale colonic mucosa. couple of tiny erosions in ascending and descending segment. rectum less involved but not completely normal. multiple erosions in TI. segmental bxs with mildly active inflammation in TI, sigmoid colon, inactive chronic proctitis, colitis in descending colon. . next tcs 2 years  . CORONARY ARTERY BYPASS GRAFT N/A 02/25/2014    Procedure: CORONARY ARTERY BYPASS GRAFTING  (CABG);  Surgeon: Ivin Poot, MD;  Location: [place];  Service: Open Heart Surgery;  Laterality: N/A;  . EYE SURGERY      . INTRAOPERATIVE TRANSESOPHAGEAL ECHOCARDIOGRAM N/A 02/25/2014    Procedure: INTRAOPERATIVE TRANSESOPHAGEAL ECHOCARDIOGRAM;  Surgeon: Ivin Poot, MD;  Location: [place];  Service: Open Heart Surgery;  Laterality: N/A;  . KNEE SURGERY Right    . LEFT HEART CATHETERIZATION WITH CORONARY ANGIOGRAM N/A 02/23/2014    Procedure: LEFT HEART CATHETERIZATION WITH CORONARY ANGIOGRAM;  Surgeon: Burnell Blanks, MD;  Location: [facility];  Service: Cardiovascular;  Laterality: N/A;  . SALPINGOOPHORECTOMY        right ovary, both tubes  . TONSILLECTOMY      . TOTAL KNEE ARTHROPLASTY Left 09/15/2018    Procedure: TOTAL KNEE ARTHROPLASTY;  Surgeon: Carole Civil, MD;  Location: [facility];  Service: Orthopedics;  Laterality: Left;       Allergies  Allergen Reactions  . Statins Other (See Comments)      Muscle aches  . Aspirin        ULCERS      Current Outpatient Medications:  .  ALPRAZolam (XANAX) 0.5 MG tablet, Take 0.5 mg by mouth daily as needed for anxiety., Disp: , Rfl:  .  aspirin EC 81 MG tablet, Take 1 tablet (81 mg total) by mouth daily., Disp: 90 tablet, Rfl: 3 .  azaTHIOprine (IMURAN) 50 MG tablet, TAKE 3 TABLETS (150 MG) BY MOUTH DAILY AS DIRECTED (Patient taking  differently: Take 150 mg by mouth every morning. TAKE 3 TABLETS (150 MG) BY MOUTH DAILY AS DIRECTED), Disp: 270 tablet, Rfl: 3 .  Calcium Carb-Cholecalciferol (CALCIUM-VITAMIN D) 600-400 MG-UNIT TABS, Take 1 tablet by mouth daily., Disp: , Rfl:  .  cholecalciferol (VITAMIN D) 1000 UNITS tablet, Take 1,000 Units by mouth daily., Disp: , Rfl:  .  Cyanocobalamin (B-12) 2500 MCG TABS, Take 2,500 mcg by mouth daily., Disp: , Rfl:  .  docusate sodium (COLACE) 100 MG capsule, Take 1 capsule (100 mg total) by mouth 2 (two) times daily., Disp: 10 capsule, Rfl: 0 .  hydrochlorothiazide (MICROZIDE) 12.5  MG capsule, Take 12.5 mg by mouth daily., Disp: , Rfl: 6 .  HYDROcodone-acetaminophen (NORCO) 7.5-325 MG tablet, Take 1 tablet by mouth every 4 (four) hours as needed for severe pain (pain score 7-10). Use tramadol first if pain persists then take hydrocodone, Disp: 42 tablet, Rfl: 0 .  Magnesium 250 MG TABS, Take 250 mg by mouth every other day., Disp: , Rfl:  .  methocarbamol (ROBAXIN) 500 MG tablet, Take 1 tablet (500 mg total) by mouth every 6 (six) hours as needed for muscle spasms., Disp: 56 tablet, Rfl: 1 .  Multiple Vitamin (MULTIVITAMIN WITH MINERALS) TABS tablet, Take 1 tablet by mouth daily., Disp: , Rfl:  .  nitroGLYCERIN (NITROSTAT) 0.4 MG SL tablet, Place 1 tablet (0.4 mg total) under the tongue every 5 (five) minutes as needed for chest pain., Disp: 25 tablet, Rfl: 3 .  Probiotic Product (PROBIOTIC FORMULA PO), Take 1 tablet by mouth every evening. , Disp: , Rfl:  .  vitamin C (ASCORBIC ACID) 500 MG tablet, Take 500 mg by mouth every other day., Disp: , Rfl:      PHYSICAL EXAM SECTION: 1) BP (!) 149/81   Pulse 70   Temp (!) 97.1 F (36.2 C)   Ht 5' 6"  (1.676 m)   Wt 188 lb (85.3 kg)   BMI 30.34 kg/m   Body mass index is 30.34 kg/m. General appearance: Well-developed well-nourished no gross deformities  2) Cardiovascular normal pulse and perfusion in the lower extremities normal color without edema  3) Neurologically deep tendon reflexes are equal and normal, no sensation loss or deficits no pathologic reflexes   4) Psychological: Awake alert and oriented x3 mood and affect normal   5) Skin no lacerations or ulcerations no nodularity no palpable masses, no erythema or nodularity   6) Musculoskeletal:    Left knee well-healed incision no tenderness 120 degrees range of motion good quad strength stable in all positions   Right knee tenderness medial joint line 125 degrees of flexion small flexion contracture good quad strength stable in all positions     MEDICAL  DECISION SECTION:      Encounter Diagnosis  Name Primary?  . Primary localized osteoarthritis of right knee Yes     Imaging Ordered x-ray   See report x-ray shows varus severe arthritis medial compartment right knee Plan:  (Rx., Inj., surg., Frx, MRI/CT, XR:2)   Right total knee   The procedure has been fully reviewed with the patient; The risks and benefits of surgery have been discussed and explained and understood. Alternative treatment has also been reviewed, questions were encouraged and answered. The postoperative plan is also been reviewed.     11:13 AM Arther Abbott, MD

## 2019-02-23 ENCOUNTER — Ambulatory Visit (HOSPITAL_COMMUNITY): Payer: Medicare HMO | Admitting: Anesthesiology

## 2019-02-23 ENCOUNTER — Encounter (HOSPITAL_COMMUNITY)
Admission: RE | Disposition: A | Discharge status: Home / Self Care | Payer: Self-pay | Source: Home / Self Care | Attending: Orthopedic Surgery

## 2019-02-23 ENCOUNTER — Encounter (HOSPITAL_COMMUNITY): Payer: Self-pay | Admitting: Anesthesiology

## 2019-02-23 ENCOUNTER — Observation Stay (HOSPITAL_COMMUNITY)
Admission: RE | Admit: 2019-02-23 | Discharge: 2019-02-24 | Disposition: A | Discharge status: Home / Self Care | Payer: Medicare HMO | Attending: Orthopedic Surgery | Admitting: Orthopedic Surgery

## 2019-02-23 ENCOUNTER — Other Ambulatory Visit: Payer: Self-pay

## 2019-02-23 ENCOUNTER — Ambulatory Visit (HOSPITAL_COMMUNITY): Payer: Medicare HMO

## 2019-02-23 DIAGNOSIS — M25561 Pain in right knee: Secondary | ICD-10-CM | POA: Diagnosis present

## 2019-02-23 DIAGNOSIS — F17200 Nicotine dependence, unspecified, uncomplicated: Secondary | ICD-10-CM | POA: Diagnosis not present

## 2019-02-23 DIAGNOSIS — I2510 Atherosclerotic heart disease of native coronary artery without angina pectoris: Secondary | ICD-10-CM | POA: Insufficient documentation

## 2019-02-23 DIAGNOSIS — F419 Anxiety disorder, unspecified: Secondary | ICD-10-CM | POA: Insufficient documentation

## 2019-02-23 DIAGNOSIS — Z96652 Presence of left artificial knee joint: Secondary | ICD-10-CM | POA: Insufficient documentation

## 2019-02-23 DIAGNOSIS — Z951 Presence of aortocoronary bypass graft: Secondary | ICD-10-CM | POA: Insufficient documentation

## 2019-02-23 DIAGNOSIS — M25761 Osteophyte, right knee: Secondary | ICD-10-CM | POA: Insufficient documentation

## 2019-02-23 DIAGNOSIS — Z85828 Personal history of other malignant neoplasm of skin: Secondary | ICD-10-CM | POA: Diagnosis not present

## 2019-02-23 DIAGNOSIS — M1711 Unilateral primary osteoarthritis, right knee: Secondary | ICD-10-CM | POA: Diagnosis not present

## 2019-02-23 DIAGNOSIS — Z96651 Presence of right artificial knee joint: Secondary | ICD-10-CM

## 2019-02-23 DIAGNOSIS — Z7982 Long term (current) use of aspirin: Secondary | ICD-10-CM | POA: Insufficient documentation

## 2019-02-23 DIAGNOSIS — Z79899 Other long term (current) drug therapy: Secondary | ICD-10-CM | POA: Insufficient documentation

## 2019-02-23 DIAGNOSIS — I252 Old myocardial infarction: Secondary | ICD-10-CM | POA: Diagnosis not present

## 2019-02-23 DIAGNOSIS — I10 Essential (primary) hypertension: Secondary | ICD-10-CM | POA: Insufficient documentation

## 2019-02-23 HISTORY — PX: TOTAL KNEE ARTHROPLASTY: SHX125

## 2019-02-23 SURGERY — ARTHROPLASTY, KNEE, TOTAL
Anesthesia: Spinal | Site: Knee | Laterality: Right

## 2019-02-23 MED ORDER — METOPROLOL TARTRATE 25 MG PO TABS
12.5000 mg | ORAL_TABLET | Freq: Two times a day (BID) | ORAL | Status: DC
  Administered 2019-02-23 – 2019-02-24 (×3): 12.5 mg via ORAL
  Filled 2019-02-23 (×3): qty 1

## 2019-02-23 MED ORDER — ROPIVACAINE HCL 5 MG/ML IJ SOLN
INTRAMUSCULAR | Status: DC | PRN
  Administered 2019-02-23: 25 mL via PERINEURAL

## 2019-02-23 MED ORDER — METHOCARBAMOL 500 MG PO TABS
500.0000 mg | ORAL_TABLET | Freq: Four times a day (QID) | ORAL | Status: DC | PRN
  Administered 2019-02-24: 500 mg via ORAL
  Filled 2019-02-23: qty 1

## 2019-02-23 MED ORDER — POVIDONE-IODINE 10 % EX SWAB: 2.0000 "application " | Freq: Once | CUTANEOUS | Status: DC

## 2019-02-23 MED ORDER — TRANEXAMIC ACID-NACL 1000-0.7 MG/100ML-% IV SOLN
INTRAVENOUS | Status: AC
  Filled 2019-02-23: qty 100

## 2019-02-23 MED ORDER — VITAMIN D 25 MCG (1000 UNIT) PO TABS
1000.0000 [IU] | ORAL_TABLET | Freq: Every day | ORAL | Status: DC
  Administered 2019-02-23 – 2019-02-24 (×2): 1000 [IU] via ORAL
  Filled 2019-02-23 (×2): qty 1

## 2019-02-23 MED ORDER — PREGABALIN 50 MG PO CAPS
50.0000 mg | ORAL_CAPSULE | Freq: Once | ORAL | Status: AC
  Administered 2019-02-23: 50 mg via ORAL
  Filled 2019-02-23: qty 1

## 2019-02-23 MED ORDER — PROPOFOL 10 MG/ML IV BOLUS
INTRAVENOUS | Status: DC | PRN
  Administered 2019-02-23: 20 mg via INTRAVENOUS

## 2019-02-23 MED ORDER — ONDANSETRON HCL 4 MG/2ML IJ SOLN
4.0000 mg | Freq: Once | INTRAMUSCULAR | Status: AC
  Administered 2019-02-23: 4 mg via INTRAVENOUS
  Filled 2019-02-23: qty 2

## 2019-02-23 MED ORDER — METOCLOPRAMIDE HCL 5 MG/ML IJ SOLN: 5.0000 mg | Freq: Three times a day (TID) | INTRAMUSCULAR | Status: DC | PRN

## 2019-02-23 MED ORDER — LACTATED RINGERS IV SOLN
INTRAVENOUS | Status: DC | PRN
  Administered 2019-02-23: 07:00:00 via INTRAVENOUS

## 2019-02-23 MED ORDER — DOCUSATE SODIUM 100 MG PO CAPS
100.0000 mg | ORAL_CAPSULE | Freq: Two times a day (BID) | ORAL | Status: DC
  Administered 2019-02-23 (×2): 100 mg via ORAL
  Filled 2019-02-23 (×3): qty 1

## 2019-02-23 MED ORDER — CEFAZOLIN SODIUM-DEXTROSE 2-4 GM/100ML-% IV SOLN
2.0000 g | INTRAVENOUS | Status: AC
  Administered 2019-02-23: 2 g via INTRAVENOUS

## 2019-02-23 MED ORDER — PHENOL 1.4 % MT LIQD: 1.0000 | OROMUCOSAL | Status: DC | PRN

## 2019-02-23 MED ORDER — CEFAZOLIN SODIUM-DEXTROSE 2-4 GM/100ML-% IV SOLN
INTRAVENOUS | Status: AC
  Filled 2019-02-23: qty 100

## 2019-02-23 MED ORDER — DEXAMETHASONE SODIUM PHOSPHATE 4 MG/ML IJ SOLN
INTRAMUSCULAR | Status: AC
  Filled 2019-02-23: qty 2

## 2019-02-23 MED ORDER — METHOCARBAMOL 1000 MG/10ML IJ SOLN
500.0000 mg | Freq: Once | INTRAVENOUS | Status: AC
  Administered 2019-02-23: 500 mg via INTRAVENOUS
  Filled 2019-02-23: qty 5

## 2019-02-23 MED ORDER — GABAPENTIN 300 MG PO CAPS
300.0000 mg | ORAL_CAPSULE | Freq: Three times a day (TID) | ORAL | Status: DC
  Administered 2019-02-23 – 2019-02-24 (×3): 300 mg via ORAL
  Filled 2019-02-23 (×3): qty 1

## 2019-02-23 MED ORDER — CELECOXIB 400 MG PO CAPS
400.0000 mg | ORAL_CAPSULE | Freq: Once | ORAL | Status: AC
  Administered 2019-02-23: 400 mg via ORAL
  Filled 2019-02-23: qty 1

## 2019-02-23 MED ORDER — ONDANSETRON HCL 4 MG/2ML IJ SOLN
4.0000 mg | Freq: Four times a day (QID) | INTRAMUSCULAR | Status: DC | PRN
  Administered 2019-02-23 – 2019-02-24 (×2): 4 mg via INTRAVENOUS
  Filled 2019-02-23 (×2): qty 2

## 2019-02-23 MED ORDER — VITAMIN C 500 MG PO TABS
500.0000 mg | ORAL_TABLET | Freq: Every day | ORAL | Status: DC
  Administered 2019-02-23 – 2019-02-24 (×2): 500 mg via ORAL
  Filled 2019-02-23 (×2): qty 1

## 2019-02-23 MED ORDER — MORPHINE SULFATE (PF) 2 MG/ML IV SOLN
0.5000 mg | INTRAVENOUS | Status: DC | PRN
  Filled 2019-02-23: qty 1

## 2019-02-23 MED ORDER — ACETAMINOPHEN 500 MG PO TABS
500.0000 mg | ORAL_TABLET | Freq: Four times a day (QID) | ORAL | Status: AC
  Administered 2019-02-23 – 2019-02-24 (×2): 500 mg via ORAL
  Filled 2019-02-23 (×2): qty 1

## 2019-02-23 MED ORDER — ADULT MULTIVITAMIN W/MINERALS CH
1.0000 | ORAL_TABLET | Freq: Every day | ORAL | Status: DC
  Administered 2019-02-23 – 2019-02-24 (×2): 1 via ORAL
  Filled 2019-02-23 (×2): qty 1

## 2019-02-23 MED ORDER — MIDAZOLAM HCL 2 MG/2ML IJ SOLN: 2.0000 mg | Freq: Once | INTRAMUSCULAR | Status: DC

## 2019-02-23 MED ORDER — AZATHIOPRINE 50 MG PO TABS
150.0000 mg | ORAL_TABLET | Freq: Every day | ORAL | Status: DC
  Administered 2019-02-24: 150 mg via ORAL
  Filled 2019-02-23 (×4): qty 3

## 2019-02-23 MED ORDER — LACTATED RINGERS IV SOLN: Freq: Once | INTRAVENOUS | Status: DC

## 2019-02-23 MED ORDER — HYDROCODONE-ACETAMINOPHEN 5-325 MG PO TABS
1.0000 | ORAL_TABLET | ORAL | Status: DC | PRN
  Administered 2019-02-24: 1 via ORAL
  Filled 2019-02-23: qty 1

## 2019-02-23 MED ORDER — HYDROCODONE-ACETAMINOPHEN 7.5-325 MG PO TABS
1.0000 | ORAL_TABLET | Freq: Once | ORAL | Status: AC
  Administered 2019-02-23: 1 via ORAL
  Filled 2019-02-23: qty 1

## 2019-02-23 MED ORDER — MEPERIDINE HCL 50 MG/ML IJ SOLN: 6.2500 mg | INTRAMUSCULAR | Status: DC | PRN

## 2019-02-23 MED ORDER — FENTANYL CITRATE (PF) 100 MCG/2ML IJ SOLN
INTRAMUSCULAR | Status: AC
  Filled 2019-02-23: qty 2

## 2019-02-23 MED ORDER — HYDROCHLOROTHIAZIDE 12.5 MG PO CAPS
12.5000 mg | ORAL_CAPSULE | Freq: Every day | ORAL | Status: DC
  Administered 2019-02-23 – 2019-02-24 (×2): 12.5 mg via ORAL
  Filled 2019-02-23 (×2): qty 1

## 2019-02-23 MED ORDER — NITROGLYCERIN 0.4 MG SL SUBL: 0.4000 mg | SUBLINGUAL_TABLET | SUBLINGUAL | Status: DC | PRN

## 2019-02-23 MED ORDER — 0.9 % SODIUM CHLORIDE (POUR BTL) OPTIME
TOPICAL | Status: DC | PRN
  Administered 2019-02-23: 1000 mL

## 2019-02-23 MED ORDER — DEXAMETHASONE SODIUM PHOSPHATE 10 MG/ML IJ SOLN
10.0000 mg | Freq: Once | INTRAMUSCULAR | Status: AC
  Administered 2019-02-24: 10 mg via INTRAVENOUS
  Filled 2019-02-23: qty 1

## 2019-02-23 MED ORDER — ONDANSETRON HCL 4 MG PO TABS: 4.0000 mg | ORAL_TABLET | Freq: Four times a day (QID) | ORAL | Status: DC | PRN

## 2019-02-23 MED ORDER — RISAQUAD PO CAPS
1.0000 | ORAL_CAPSULE | Freq: Every evening | ORAL | Status: DC
  Administered 2019-02-23: 1 via ORAL
  Filled 2019-02-23: qty 1

## 2019-02-23 MED ORDER — PROPOFOL 500 MG/50ML IV EMUL
INTRAVENOUS | Status: DC | PRN
  Administered 2019-02-23: 25 ug/kg/min via INTRAVENOUS

## 2019-02-23 MED ORDER — POLYETHYLENE GLYCOL 3350 17 G PO PACK
17.0000 g | PACK | Freq: Every day | ORAL | Status: DC
  Administered 2019-02-23: 17 g via ORAL
  Filled 2019-02-23 (×2): qty 1

## 2019-02-23 MED ORDER — MAGNESIUM OXIDE 400 (241.3 MG) MG PO TABS
200.0000 mg | ORAL_TABLET | Freq: Every day | ORAL | Status: DC
  Administered 2019-02-23 – 2019-02-24 (×2): 200 mg via ORAL
  Filled 2019-02-23 (×2): qty 1

## 2019-02-23 MED ORDER — CALCIUM CARBONATE-VITAMIN D 500-200 MG-UNIT PO TABS
1.0000 | ORAL_TABLET | Freq: Every day | ORAL | Status: DC
  Administered 2019-02-23 – 2019-02-24 (×2): 1 via ORAL
  Filled 2019-02-23 (×2): qty 1

## 2019-02-23 MED ORDER — LIDOCAINE HCL (PF) 1 % IJ SOLN
INTRAMUSCULAR | Status: DC | PRN
  Administered 2019-02-23: 3 mL

## 2019-02-23 MED ORDER — MIDAZOLAM HCL 5 MG/5ML IJ SOLN
INTRAMUSCULAR | Status: DC | PRN
  Administered 2019-02-23: 1 mg via INTRAVENOUS
  Administered 2019-02-23: 0.5 mg via INTRAVENOUS

## 2019-02-23 MED ORDER — BUPIVACAINE LIPOSOME 1.3 % IJ SUSP
INTRAMUSCULAR | Status: AC
  Filled 2019-02-23: qty 20

## 2019-02-23 MED ORDER — SODIUM CHLORIDE 0.9 % IR SOLN
Status: DC | PRN
  Administered 2019-02-23: 3000 mL

## 2019-02-23 MED ORDER — SODIUM CHLORIDE 0.9 % IV SOLN
INTRAVENOUS | Status: AC
  Administered 2019-02-23: 16:00:00 via INTRAVENOUS

## 2019-02-23 MED ORDER — TRANEXAMIC ACID-NACL 1000-0.7 MG/100ML-% IV SOLN
1000.0000 mg | Freq: Once | INTRAVENOUS | Status: AC
  Administered 2019-02-23: 1000 mg via INTRAVENOUS
  Filled 2019-02-23: qty 100

## 2019-02-23 MED ORDER — MIDAZOLAM HCL 2 MG/2ML IJ SOLN
INTRAMUSCULAR | Status: AC
  Filled 2019-02-23: qty 2

## 2019-02-23 MED ORDER — METHOCARBAMOL 1000 MG/10ML IJ SOLN: 500.0000 mg | Freq: Four times a day (QID) | INTRAVENOUS | Status: DC | PRN

## 2019-02-23 MED ORDER — DEXAMETHASONE SODIUM PHOSPHATE 10 MG/ML IJ SOLN
INTRAMUSCULAR | Status: DC | PRN
  Administered 2019-02-23: 6 mg

## 2019-02-23 MED ORDER — CHLORHEXIDINE GLUCONATE 4 % EX LIQD: 60.0000 mL | Freq: Once | CUTANEOUS | Status: DC

## 2019-02-23 MED ORDER — METOCLOPRAMIDE HCL 10 MG PO TABS: 5.0000 mg | ORAL_TABLET | Freq: Three times a day (TID) | ORAL | Status: DC | PRN

## 2019-02-23 MED ORDER — MENTHOL 3 MG MT LOZG: 1.0000 | LOZENGE | OROMUCOSAL | Status: DC | PRN

## 2019-02-23 MED ORDER — TRANEXAMIC ACID-NACL 1000-0.7 MG/100ML-% IV SOLN
1000.0000 mg | INTRAVENOUS | Status: AC
  Administered 2019-02-23: 1000 mg via INTRAVENOUS

## 2019-02-23 MED ORDER — TRAMADOL HCL 50 MG PO TABS
50.0000 mg | ORAL_TABLET | Freq: Four times a day (QID) | ORAL | Status: DC
  Administered 2019-02-23 – 2019-02-24 (×3): 50 mg via ORAL
  Filled 2019-02-23 (×3): qty 1

## 2019-02-23 MED ORDER — BUPIVACAINE-EPINEPHRINE (PF) 0.25% -1:200000 IJ SOLN
INTRAMUSCULAR | Status: AC
  Filled 2019-02-23: qty 30

## 2019-02-23 MED ORDER — ONDANSETRON HCL 4 MG/2ML IJ SOLN: 4.0000 mg | Freq: Once | INTRAMUSCULAR | Status: DC | PRN

## 2019-02-23 MED ORDER — PROPOFOL 10 MG/ML IV BOLUS
INTRAVENOUS | Status: AC
  Filled 2019-02-23: qty 20

## 2019-02-23 MED ORDER — HYDROCODONE-ACETAMINOPHEN 7.5-325 MG PO TABS
1.0000 | ORAL_TABLET | ORAL | Status: DC | PRN
  Administered 2019-02-23 – 2019-02-24 (×2): 1 via ORAL
  Filled 2019-02-23 (×2): qty 1

## 2019-02-23 MED ORDER — CEFAZOLIN SODIUM-DEXTROSE 2-4 GM/100ML-% IV SOLN
2.0000 g | Freq: Four times a day (QID) | INTRAVENOUS | Status: AC
  Administered 2019-02-23 (×2): 2 g via INTRAVENOUS
  Filled 2019-02-23 (×2): qty 100

## 2019-02-23 MED ORDER — BUPIVACAINE IN DEXTROSE 0.75-8.25 % IT SOLN
INTRATHECAL | Status: DC | PRN
  Administered 2019-02-23: 1.8 mL via INTRATHECAL

## 2019-02-23 MED ORDER — ASPIRIN EC 81 MG PO TBEC
81.0000 mg | DELAYED_RELEASE_TABLET | Freq: Every day | ORAL | Status: DC
  Administered 2019-02-23 – 2019-02-24 (×2): 81 mg via ORAL
  Filled 2019-02-23 (×2): qty 1

## 2019-02-23 MED ORDER — LIDOCAINE HCL (PF) 1 % IJ SOLN
INTRAMUSCULAR | Status: AC
  Filled 2019-02-23: qty 30

## 2019-02-23 MED ORDER — HYDROMORPHONE HCL 1 MG/ML IJ SOLN
0.2500 mg | INTRAMUSCULAR | Status: DC | PRN
  Administered 2019-02-23 (×3): 0.5 mg via INTRAVENOUS
  Filled 2019-02-23 (×3): qty 0.5

## 2019-02-23 MED ORDER — BUPIVACAINE LIPOSOME 1.3 % IJ SUSP
INTRAMUSCULAR | Status: DC | PRN
  Administered 2019-02-23: 20 mL

## 2019-02-23 MED ORDER — VITAMIN B-12 1000 MCG PO TABS
2500.0000 ug | ORAL_TABLET | Freq: Every day | ORAL | Status: DC
  Administered 2019-02-23 – 2019-02-24 (×2): 2500 ug via ORAL
  Filled 2019-02-23 (×2): qty 3

## 2019-02-23 MED ORDER — ROPIVACAINE HCL 5 MG/ML IJ SOLN
INTRAMUSCULAR | Status: AC
  Filled 2019-02-23: qty 30

## 2019-02-23 MED ORDER — ALPRAZOLAM 0.5 MG PO TABS: 0.5000 mg | ORAL_TABLET | Freq: Every day | ORAL | Status: DC | PRN

## 2019-02-23 MED ORDER — FENTANYL CITRATE (PF) 100 MCG/2ML IJ SOLN
INTRAMUSCULAR | Status: DC | PRN
  Administered 2019-02-23 (×2): 25 ug via INTRAVENOUS

## 2019-02-23 SURGICAL SUPPLY — 66 items
BANDAGE ELASTIC 4 VELCRO NS (GAUZE/BANDAGES/DRESSINGS) ×4 IMPLANT
BANDAGE ELASTIC 6 VELCRO NS (GAUZE/BANDAGES/DRESSINGS) ×2 IMPLANT
BANDAGE ESMARK 6X9 LF (GAUZE/BANDAGES/DRESSINGS) ×1 IMPLANT
BLADE HEX COATED 2.75 (ELECTRODE) ×2 IMPLANT
BLADE SAGITTAL 25.0X1.27X90 (BLADE) ×2 IMPLANT
BNDG CMPR 9X6 STRL LF SNTH (GAUZE/BANDAGES/DRESSINGS) ×1
BNDG ESMARK 6X9 LF (GAUZE/BANDAGES/DRESSINGS) ×2
CEMENT HV SMART SET (Cement) ×4 IMPLANT
CLOTH BEACON ORANGE TIMEOUT ST (SAFETY) ×2 IMPLANT
COOLER CRYO CUFF IC AND MOTOR (MISCELLANEOUS) ×2 IMPLANT
COVER LIGHT HANDLE STERIS (MISCELLANEOUS) ×4 IMPLANT
COVER WAND RF STERILE (DRAPES) ×2 IMPLANT
CUFF CRYO KNEE18X23 MED (MISCELLANEOUS) ×2 IMPLANT
CUFF TOURN SGL QUICK 34 (TOURNIQUET CUFF) ×2
CUFF TRNQT CYL 34X4.125X (TOURNIQUET CUFF) ×1 IMPLANT
DECANTER SPIKE VIAL GLASS SM (MISCELLANEOUS) ×2 IMPLANT
DRAPE BACK TABLE (DRAPES) ×2 IMPLANT
DRAPE EXTREMITY T 121X128X90 (DISPOSABLE) ×2 IMPLANT
DRSG MEPILEX BORDER 4X12 (GAUZE/BANDAGES/DRESSINGS) ×2 IMPLANT
DURAPREP 26ML APPLICATOR (WOUND CARE) ×4 IMPLANT
ELECT REM PT RETURN 9FT ADLT (ELECTROSURGICAL) ×2
ELECTRODE REM PT RTRN 9FT ADLT (ELECTROSURGICAL) ×1 IMPLANT
FEMORAL POS LUG CEM SZ 4N (Orthopedic Implant) ×2 IMPLANT
GLOVE BIO SURGEON STRL SZ7 (GLOVE) ×4 IMPLANT
GLOVE BIOGEL PI IND STRL 7.0 (GLOVE) ×4 IMPLANT
GLOVE BIOGEL PI INDICATOR 7.0 (GLOVE) ×4
GLOVE ECLIPSE 6.5 STRL STRAW (GLOVE) ×2 IMPLANT
GLOVE SKINSENSE NS SZ8.0 LF (GLOVE) ×2
GLOVE SKINSENSE STRL SZ8.0 LF (GLOVE) ×2 IMPLANT
GLOVE SS N UNI LF 8.5 STRL (GLOVE) ×2 IMPLANT
GOWN STRL REUS W/TWL LRG LVL3 (GOWN DISPOSABLE) ×6 IMPLANT
GOWN STRL REUS W/TWL XL LVL3 (GOWN DISPOSABLE) ×2 IMPLANT
HANDPIECE INTERPULSE COAX TIP (DISPOSABLE) ×2
HOOD W/PEELAWAY (MISCELLANEOUS) ×8 IMPLANT
INSERT CROSS LINKED SZ 4 10MM (Knees) ×2 IMPLANT
INST SET MAJOR BONE (KITS) ×2 IMPLANT
IV NS IRRIG 3000ML ARTHROMATIC (IV SOLUTION) ×2 IMPLANT
KIT BLADEGUARD II DBL (SET/KITS/TRAYS/PACK) ×2 IMPLANT
KIT TURNOVER KIT A (KITS) ×2 IMPLANT
MANIFOLD NEPTUNE II (INSTRUMENTS) ×2 IMPLANT
MARKER SKIN DUAL TIP RULER LAB (MISCELLANEOUS) ×2 IMPLANT
NEEDLE HYPO 18GX1.5 BLUNT FILL (NEEDLE) ×2 IMPLANT
NEEDLE HYPO 21X1.5 SAFETY (NEEDLE) ×2 IMPLANT
NS IRRIG 1000ML POUR BTL (IV SOLUTION) ×2 IMPLANT
PACK TOTAL JOINT (CUSTOM PROCEDURE TRAY) ×2 IMPLANT
PAD ARMBOARD 7.5X6 YLW CONV (MISCELLANEOUS) ×2 IMPLANT
PATELLA DOME PFC 38MM (Knees) ×2 IMPLANT
PILLOW KNEE EXTENSION 0 DEG (MISCELLANEOUS) ×2 IMPLANT
PIN THREADED HEADED SIGMA (PIN) ×2 IMPLANT
PIN/DRILL PACK ORTHO 1/8X3.0 (PIN) ×2 IMPLANT
SAW OSC TIP CART 19.5X105X1.3 (SAW) ×2 IMPLANT
SET BASIN LINEN APH (SET/KITS/TRAYS/PACK) ×2 IMPLANT
SET HNDPC FAN SPRY TIP SCT (DISPOSABLE) ×1 IMPLANT
STAPLER VISISTAT 35W (STAPLE) ×2 IMPLANT
SUT BRALON NAB BRD #1 30IN (SUTURE) ×2 IMPLANT
SUT MNCRL 0 VIOLET CTX 36 (SUTURE) ×1 IMPLANT
SUT MON AB 0 CT1 (SUTURE) ×2 IMPLANT
SUT MONOCRYL 0 CTX 36 (SUTURE) ×1
SYR 20ML LL LF (SYRINGE) ×6 IMPLANT
SYR BULB IRRIGATION 50ML (SYRINGE) ×2 IMPLANT
TOWEL OR 17X26 4PK STRL BLUE (TOWEL DISPOSABLE) ×2 IMPLANT
TOWER CARTRIDGE SMART MIX (DISPOSABLE) ×2 IMPLANT
TRAY FOLEY MTR SLVR 16FR STAT (SET/KITS/TRAYS/PACK) ×2 IMPLANT
TRAY TIBIAL MOD SZ 4.0 (Knees) ×2 IMPLANT
WATER STERILE IRR 1000ML POUR (IV SOLUTION) ×4 IMPLANT
YANKAUER SUCT 12FT TUBE ARGYLE (SUCTIONS) ×2 IMPLANT

## 2019-02-23 NOTE — Anesthesia Preprocedure Evaluation (Signed)
Anesthesia Evaluation  Patient identified by MRN, date of birth, ID band Patient awake    Reviewed: Allergy & Precautions, NPO status , Patient's Chart, lab work & pertinent test results, reviewed documented beta blocker date and time   History of Anesthesia Complications Negative for: history of anesthetic complications  Airway Mallampati: II  TM Distance: >3 FB Neck ROM: Full    Dental  (+) Poor Dentition, Missing, Dental Advisory Given, Chipped   Pulmonary Current Smoker and Patient abstained from smoking.,    Pulmonary exam normal breath sounds clear to auscultation       Cardiovascular Exercise Tolerance: Good hypertension, Pt. on medications and Pt. on home beta blockers + CAD, + Past MI and + CABG   Rhythm:Regular Rate:Normal  18-Feb-2019 10:33:44 [facility] ROUTINE RECORD Sinus rhythm with Premature supraventricular complexes and Premature ventricular complexes or Fusion complexes Otherwise normal ECG   Neuro/Psych  Headaches, PSYCHIATRIC DISORDERS Anxiety Depression    GI/Hepatic PUD, (+) Hepatitis -, A  Endo/Other  negative endocrine ROS  Renal/GU negative Renal ROS     Musculoskeletal  (+) Arthritis ,   Abdominal   Peds  Hematology  (+) anemia ,   Anesthesia Other Findings   Reproductive/Obstetrics                           Anesthesia Physical Anesthesia Plan  ASA: III  Anesthesia Plan: General/Spinal   Post-op Pain Management:  Regional for Post-op pain   Induction:   PONV Risk Score and Plan: 2 and Dexamethasone, Treatment may vary due to age or medical condition, Midazolam and TIVA  Airway Management Planned: Natural Airway, Nasal Cannula and Simple Face Mask  Additional Equipment:   Intra-op Plan:   Post-operative Plan:   Informed Consent: I have reviewed the patients History and Physical, chart, labs and discussed the procedure including  the risks, benefits and alternatives for the proposed anesthesia with the patient or authorized representative who has indicated his/her understanding and acceptance.     Dental advisory given  Plan Discussed with: CRNA  Anesthesia Plan Comments: (Right adductor canal block postop pain, risks and benefits of nerve block and spinal anesthesia were explained(eg: infection, bleeding, nerve injury, and reactions to medications).)        Anesthesia Quick Evaluation

## 2019-02-23 NOTE — Anesthesia Procedure Notes (Addendum)
Spinal  Patient location during procedure: OR Start time: 02/23/2019 7:42 AM Staffing Resident/CRNA: Ollen Bowl, CRNA Preanesthetic Checklist Completed: patient identified, site marked, surgical consent, pre-op evaluation, timeout performed, IV checked, risks and benefits discussed and monitors and equipment checked Spinal Block Patient position: sitting Prep: ChloraPrep Patient monitoring: heart rate, cardiac monitor, continuous pulse ox and blood pressure Approach: midline Location: L3-4 Injection technique: single-shot Needle Needle type: Pencan  Needle gauge: 24 G Needle length: 10 cm Assessment Sensory level: T8 Additional Notes ATTEMPTS:1 TRAY TC:[id number] TRAY EXPIRATION DATE:01/06/20

## 2019-02-23 NOTE — Transfer of Care (Signed)
Immediate Anesthesia Transfer of Care Note  Patient: Bonnie Powers  Procedure(s) Performed: TOTAL KNEE ARTHROPLASTY (Right Knee)  Patient Location: PACU  Anesthesia Type:Spinal  Level of Consciousness: awake and alert   Airway & Oxygen Therapy: Patient Spontanous Breathing  Post-op Assessment: Report given to RN  Post vital signs: Reviewed and stable  Last Vitals:  Vitals Value Taken Time  BP    Temp    Pulse    Resp 28 02/23/19 0934  SpO2    Vitals shown include unvalidated device data.  Last Pain:  Vitals:   02/23/19 0714  PainSc: 0-No pain      Patients Stated Pain Goal: 6 (11/00/34 9611)  Complications: No apparent anesthesia complications

## 2019-02-23 NOTE — Interval H&P Note (Signed)
History and Physical Interval Note:  02/23/2019 7:26 AM  Bonnie Powers  has presented today for surgery, with the diagnosis of osteoarthritis right knee.  The various methods of treatment have been discussed with the patient and family. After consideration of risks, benefits and other options for treatment, the patient has consented to  Procedure(s): TOTAL KNEE ARTHROPLASTY (Right) as a surgical intervention.  The patient's history has been reviewed, patient examined, no change in status, stable for surgery.  I have reviewed the patient's chart and labs.  Questions were answered to the patient's satisfaction.     Arther Abbott

## 2019-02-23 NOTE — Anesthesia Procedure Notes (Signed)
Anesthesia Regional Block: Adductor canal block   Pre-Anesthetic Checklist: ,, timeout performed, Correct Patient, Correct Site, Correct Laterality, Correct Procedure, Correct Position, site marked, Risks and benefits discussed,  Surgical consent,  Pre-op evaluation,  At surgeon's request and post-op pain management  Laterality: Right  Prep: chloraprep       Needles:   Needle Type: Echogenic Stimulator Needle     Needle Length: 10cm  Needle Gauge: 20     Additional Needles:   Procedures:,,,, ultrasound used (permanent image in chart),,,,  Narrative:  Start time: 02/23/2019 7:14 AM End time: 02/23/2019 7:26 AM  Performed by: Personally  Anesthesiologist: Denese Killings, MD  Additional Notes: Local infiltration with 3 ml of 1% lidocaine, block with ropivacaine 0.5% - 25 ml and dexamethasone 6 mg

## 2019-02-23 NOTE — Brief Op Note (Signed)
02/23/2019  9:33 AM  PATIENT:  Bonnie Powers  73 y.o. female  PRE-OPERATIVE DIAGNOSIS:  osteoarthritis right knee  POST-OPERATIVE DIAGNOSIS:  osteoarthritis right knee  PROCEDURE:  Procedure(s): TOTAL KNEE ARTHROPLASTY (Right)   DEPUY  58F 4T 10 PS POLY 38 X 9 P PS FIXED SIGMA   10 FEMUR CUT  10 LATERAL TIBIA CUT   GRADE 4 OA MEDIAL FEMUR AND TIBIA GARDE 2 OA PATEALLA  LATERAL SIDE NO OA LAT MENISCUS INTACT    SURGEON:  Surgeon(s) and Role:    * Carole Civil, MD - Primary  PHYSICIAN ASSISTANT:   ASSISTANTS: CYNTHIA WRENN AND DEBI DALLAS    ANESTHESIA:   spinal and MEDIAL KNEE BLOCK   EBL:  MIN   BLOOD ADMINISTERED:none  DRAINS: none   LOCAL MEDICATIONS USED:  OTHER EXPAREL 20 CC FULL STRENGTH   SPECIMEN:  No Specimen  DISPOSITION OF SPECIMEN:  N/A  COUNTS:  YES  TOURNIQUET:   Total Tourniquet Time Documented: Thigh (Right) - 75 minutes Total: Thigh (Right) - 75 minutes   DICTATION: .Viviann Spare Dictation  PLAN OF CARE: Discharge to home after PACU  PATIENT DISPOSITION:  PACU - hemodynamically stable.   Delay start of Pharmacological VTE agent (>24hrs) due to surgical blood loss or risk of bleeding: yes

## 2019-02-23 NOTE — Plan of Care (Signed)
  Problem: Acute Rehab PT Goals(only PT should resolve) Goal: Pt Will Go Supine/Side To Sit Outcome: Progressing Flowsheets (Taken 02/23/2019 1525) Pt will go Supine/Side to Sit: with modified independence Goal: Patient Will Transfer Sit To/From Stand Outcome: Progressing Flowsheets (Taken 02/23/2019 1525) Patient will transfer sit to/from stand: with modified independence Goal: Pt Will Transfer Bed To Chair/Chair To Bed Outcome: Progressing Flowsheets (Taken 02/23/2019 1525) Pt will Transfer Bed to Chair/Chair to Bed: with modified independence Goal: Pt Will Ambulate Outcome: Progressing Flowsheets (Taken 02/23/2019 1525) Pt will Ambulate:  > 125 feet  with modified independence  with rolling walker   3:25 PM, 02/23/19 Lonell Grandchild, MPT Physical Therapist with [facility] [phone] office [phone] mobile phone

## 2019-02-23 NOTE — Evaluation (Signed)
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    Clinical Impression  Patient presents with c/o nausea with occasional vomiting - RN notified and patient given medication prior to OOB activities.  Patient demonstrates good return for moving RLE during bed mobility, self stretching right knee into flexion while seated at bedside and able to achieve 105 degrees flexion, limited for ambulation due to c/o fatigue and nausea and tolerated sitting up in chair with RLE dangling after therapy.  Patient will benefit from continued physical therapy in hospital and recommended venue below to increase strength, balance, endurance for safe ADLs and gait.    Follow Up Recommendations Home health PT;Supervision for mobility/OOB;Supervision - Intermittent    Equipment Recommendations  None recommended by PT    Recommendations for Other Services       Precautions / Restrictions Precautions Precautions: Fall Restrictions Weight Bearing Restrictions: Yes RLE Weight Bearing: Weight bearing as tolerated      Mobility  Bed Mobility Overal bed mobility: Needs Assistance Bed Mobility: Supine to Sit     Supine to sit: Supervision;Min guard     General bed mobility comments: slightly labored movement  Transfers Overall transfer level: Needs assistance Equipment used: Rolling walker  (2 wheeled) Transfers: Sit to/from [employer] Transfers Sit to Stand: Supervision;Min guard Stand pivot transfers: Supervision;Min guard       General transfer comment: increased time, slightly labored movement  Ambulation/Gait Ambulation/Gait assistance: Supervision;Min guard Gait Distance (Feet): 45 Feet Assistive device: Rolling walker (2 wheeled) Gait Pattern/deviations: Decreased step length - right;Decreased stance time - right;Decreased stride length Gait velocity: decreased   General Gait Details: slightly labored cadence with fair/good return for right heel to toe stepping without loss of balance, limited secondary to c/o fatigue and nausea  Stairs            Wheelchair Mobility    Modified Rankin (Stroke Patients Only)       Balance Overall balance assessment: Needs assistance Sitting-balance support: Feet supported;No upper extremity supported Sitting balance-Leahy Scale: Good Sitting balance - Comments: seated at EOB   Standing balance support: During functional activity;Bilateral upper extremity supported Standing balance-Leahy Scale: Fair Standing balance comment: fair/good using RW                             Pertinent Vitals/Pain Pain Assessment: 0-10 Pain Score: 5  Pain Location: right knee Pain Descriptors / Indicators: Sore Pain Intervention(s): Limited activity within patient's tolerance;Monitored during session    Home Living Family/patient expects to be discharged to:: Private residence Living Arrangements: Spouse/significant other Available Help at Discharge: Family;Available 24 hours/day Type of Home: Mobile home Home Access: Ramped entrance     Home Layout: One level Home Equipment: [place] - 2 wheels;Cane - single point;Toilet riser;Shower seat - built in      Prior Function Level of Independence: Independent  Comments: Hydrographic surveyor, drives     Journalist, newspaper        Extremity/Trunk  Assessment   Upper Extremity Assessment Upper Extremity Assessment: Overall WFL for tasks assessed    Lower Extremity Assessment Lower Extremity Assessment: RLE deficits/detail;Overall WFL for tasks assessed RLE Deficits / Details: grossly -4/5    Cervical / Trunk Assessment Cervical / Trunk Assessment: Normal  Communication   Communication: No difficulties  Cognition Arousal/Alertness: Awake/alert Behavior During Therapy: WFL for tasks assessed/performed Overall Cognitive Status: Within Functional Limits for tasks assessed                                        General Comments      Exercises Total Joint Exercises Ankle Circles/Pumps: Supine;10 reps;Right;Strengthening;AROM Quad Sets: Supine;10 reps;Right;Strengthening;AROM Short Arc Quad: Supine;10 reps;Right;Strengthening;AROM Heel Slides: Supine;10 reps;Right;Strengthening;AROM Goniometric ROM: right knee ROM: 2-105 degrees   Assessment/Plan    PT Assessment Patient needs continued PT services  PT Problem List Decreased strength;Decreased range of motion;Decreased activity tolerance;Decreased balance;Decreased mobility       PT Treatment Interventions Gait training;Stair training;Functional mobility training;Therapeutic activities;Therapeutic exercise;Patient/family education    PT Goals (Current goals can be found in the Care Plan section)  Acute Rehab PT Goals Patient Stated Goal: return home PT Goal Formulation: With patient Time For Goal Achievement: 02/26/19 Potential to Achieve Goals: Good    Frequency BID   Barriers to discharge        Co-evaluation               AM-PAC PT "6 Clicks" Mobility  Outcome Measure Help needed turning from your back to your side while in a flat bed without using bedrails?: None Help needed moving from lying on your back to sitting on the side of a flat bed without using bedrails?: A Little Help needed moving to and from a bed to a chair (including  a wheelchair)?: A Little Help needed standing up from a chair using your arms (e.g., wheelchair or bedside chair)?: A Little Help needed to walk in hospital room?: A Little Help needed climbing 3-5 steps with a railing? : A Lot 6 Click Score: 18    End of Session   Activity Tolerance: Patient tolerated treatment well;Patient limited by fatigue Patient left: in chair;with call bell/phone within reach Nurse Communication: Mobility status PT Visit Diagnosis: Unsteadiness on feet (R26.81);Other abnormalities of gait and mobility (R26.89);Muscle weakness (generalized) (M62.81)    Time: 3976-7341 PT Time Calculation (min) (ACUTE ONLY): 34 min   Charges:   PT Evaluation $PT Eval Moderate Complexity: 1 Mod PT Treatments $Therapeutic Activity: 23-37 mins        3:24 PM, 02/23/19 Lonell Grandchild, MPT Physical Therapist with [facility] [phone] office [phone] mobile phone

## 2019-02-23 NOTE — Anesthesia Postprocedure Evaluation (Signed)
Anesthesia Post Note  Patient: Bonnie Powers  Procedure(s) Performed: TOTAL KNEE ARTHROPLASTY (Right Knee)  Patient location during evaluation: PACU Anesthesia Type: Combined General/Spinal Level of consciousness: awake and alert and oriented Pain management: pain level controlled Vital Signs Assessment: post-procedure vital signs reviewed and stable Respiratory status: spontaneous breathing Cardiovascular status: blood pressure returned to baseline and stable Postop Assessment: no apparent nausea or vomiting Anesthetic complications: no     Last Vitals:  Vitals:   02/23/19 1030 02/23/19 1045  BP: (!) 142/54   Pulse: 60   Resp: 16   Temp:  36.4 C  SpO2: 98%     Last Pain:  Vitals:   02/23/19 1125  PainSc: 10-Worst pain ever                 Tykera Skates

## 2019-02-23 NOTE — Op Note (Signed)
02/23/2019  9:33 AM  PATIENT:  Bonnie Powers  73 y.o. female  PRE-OPERATIVE DIAGNOSIS:  osteoarthritis right knee  POST-OPERATIVE DIAGNOSIS:  osteoarthritis right knee  PROCEDURE:  Procedure(s): TOTAL KNEE ARTHROPLASTY (Right)   DEPUY  66F 4T 10 PS POLY 38 X 9 P PS FIXED SIGMA   10 FEMUR CUT  10 LATERAL TIBIA CUT   GRADE 4 OA MEDIAL FEMUR AND TIBIA GARDE 2 OA PATEALLA  LATERAL SIDE NO OA LAT MENISCUS INTACT    SURGEON:  Surgeon(s) and Role:    Carole Civil, MD - Primary  PHYSICIAN ASSISTANT:   ASSISTANTS: CYNTHIA WRENN AND DEBI DALLAS    ANESTHESIA:   spinal and MEDIAL KNEE BLOCK   EBL:  MIN   BLOOD ADMINISTERED:none  DRAINS: none   LOCAL MEDICATIONS USED:  OTHER EXPAREL 20 CC FULL STRENGTH   SPECIMEN:  No Specimen  DISPOSITION OF SPECIMEN:  N/A  COUNTS:  YES  TOURNIQUET:   Total Tourniquet Time Documented: Thigh (Right) - 75 minutes Total: Thigh (Right) - 75 minutes   DICTATION: .Dragon Dictation  Today's date  Date of surgery  Operative report for a right total knee arthroplasty  Preop diagnosis primary osteoarthritis right knee Postop diagnosis same Procedure right total knee arthroplasty Implants Depuy fixed-bearing posterior stabilized Sigma prosthesis  Surgeon Aline Brochure [phone]   Details of procedure  Antibiotic: ANCEF     The patient was identified in the preop holding area and the surgical site was confirmed as the right knee. Chart review and update were completed. The patient was taken to the operating room for spinal anesthesia. After successful spinal anesthesia Foley catheter was inserted. The patient was placed supine on the operating table.   the right leg was prepped with DuraPrep and draped sterilely. Timeout was completed. The limb was then exsanguinated a  6 inch Esmarch. The tourniquet was elevated to 300 mmHg.   A midline incision was made and taken down to the extensor mechanism followed by medial  arthrotomy. The patella was everted. A synovectomy was performed as needed. The osteophytes were resected.  Anterior cruciate ligament and PCL and medial and lateral meniscus were resected.   the tibia was subluxated forward and the external alignment guide was placed. We removed 10 mm of bone from the higher  LATERAL  side. We set the guide for neutral varus valgus cut related to the  Mechanical axis of the tibia and for slope matching the patient's anatomy. Rotational alignment was set using the tibial tubercle, tibial spine and second metatarsal. The cutting block was pinned and the proximal tibia was resected.     a 3/8 inch drill bit was used to enter the femoral canal which was suctioned and irrigated until the fluid was clear. The distal femoral cut was set for 11 millimeter resection with a 5  Right Valgus angle. This cut was completed and checked for flatness.   the femur was then measured to a size 4.    A spacer block was placed starting with a 10 mm insert to check the extension gap. IT FIT PERFECTLY    The 4-in-1 femoral cutting block was placed to match the femoral epicondyles and the 4 distal cuts were made.   A spacer block was placed starting with a 10 mm insert to check the flexion and extension gap. The 10MM spacer block confirmed proper balance in flexion and extension.   We placed the femoral notch cutting guide size 4  and resected the  notch.   Trial implants were placed using appropriate size femur , appropriate size tibial baseplate which was measured after the proximal tibia resection. Tibial rotation was set patella tracking was normal   The tibia was then punched per manufacture technique making sure to avoid internal rotation.   The patella measured a size 23MM   We resected down to a size 12 using a size 38 button. Total patella thickness was 21   Final range of motion check was performed with the appropriate size trials as mentioned above. Satisfactory  reduction and motion were obtained.   Trial implants were removed. The bone was irrigated and dried and the cement was mixed on the back table  exparel was injected in the soft tissues and posterior capsule of the knee  These implants were then cemented in place. Excess cement was removed. The cement was allowed to cure. Second irrigation was performed.    FInal range of motion check and stability check was completed  The wound was irrigated t a third time ,Hemovac drain was placed, extensor mechanism was closed with #1 Nurolon followed by 0 Monocryl and staples to reapproximate the skin edges and subcutaneous tissue.   Sterile dressing and cryocuff  was applied  The patient was taken recovery in stable condition  PLAN OF CARE: Discharge to home after PACU  PATIENT DISPOSITION:  PACU - hemodynamically stable.   Delay start of Pharmacological VTE agent (>24hrs) due to surgical blood loss or risk of bleeding: yes

## 2019-02-24 ENCOUNTER — Encounter (HOSPITAL_COMMUNITY): Payer: Self-pay | Admitting: Orthopedic Surgery

## 2019-02-24 ENCOUNTER — Telehealth: Payer: Self-pay | Admitting: Orthopedic Surgery

## 2019-02-24 DIAGNOSIS — M1711 Unilateral primary osteoarthritis, right knee: Secondary | ICD-10-CM | POA: Diagnosis not present

## 2019-02-24 LAB — CBC
HCT: 31.8 % — ABNORMAL LOW (ref 36.0–46.0)
Hemoglobin: 10.1 g/dL — ABNORMAL LOW (ref 12.0–15.0)
MCH: 29.8 pg (ref 26.0–34.0)
MCHC: 31.8 g/dL (ref 30.0–36.0)
MCV: 93.8 fL (ref 80.0–100.0)
Platelets: 235 10*3/uL (ref 150–400)
RBC: 3.39 MIL/uL — ABNORMAL LOW (ref 3.87–5.11)
RDW: 16.1 % — ABNORMAL HIGH (ref 11.5–15.5)
WBC: 8 10*3/uL (ref 4.0–10.5)
nRBC: 0 % (ref 0.0–0.2)

## 2019-02-24 LAB — BASIC METABOLIC PANEL
Anion gap: 7 (ref 5–15)
BUN: 13 mg/dL (ref 8–23)
CO2: 26 mmol/L (ref 22–32)
Calcium: 9.4 mg/dL (ref 8.9–10.3)
Chloride: 104 mmol/L (ref 98–111)
Creatinine, Ser: 0.79 mg/dL (ref 0.44–1.00)
GFR calc Af Amer: >60 mL/min (ref >60)
GFR calc non Af Amer: >60 mL/min (ref >60)
Glucose, Bld: 114 mg/dL — ABNORMAL HIGH (ref 70–99)
Potassium: 3.5 mmol/L (ref 3.5–5.1)
Sodium: 137 mmol/L (ref 135–145)

## 2019-02-24 MED ORDER — HYDROCODONE-ACETAMINOPHEN 7.5-325 MG PO TABS: 1.0000 | ORAL_TABLET | ORAL | 0 refills | Status: DC | PRN

## 2019-02-24 MED ORDER — CHLORHEXIDINE GLUCONATE CLOTH 2 % EX PADS
6.0000 | MEDICATED_PAD | Freq: Every day | CUTANEOUS | Status: DC
  Administered 2019-02-24: 6 via TOPICAL

## 2019-02-24 MED ORDER — ASPIRIN 81 MG PO TBEC: 81.0000 mg | DELAYED_RELEASE_TABLET | Freq: Two times a day (BID) | ORAL | 0 refills | Status: AC

## 2019-02-24 MED ORDER — METHOCARBAMOL 500 MG PO TABS: 500.0000 mg | ORAL_TABLET | Freq: Four times a day (QID) | ORAL | 1 refills | Status: DC

## 2019-02-24 MED ORDER — METHOCARBAMOL 500 MG PO TABS
500.0000 mg | ORAL_TABLET | Freq: Four times a day (QID) | ORAL | Status: DC
  Administered 2019-02-24: 500 mg via ORAL
  Filled 2019-02-24: qty 1

## 2019-02-24 NOTE — Telephone Encounter (Signed)
Pharmacist from [place] - ph [phone] - regarding request for refill on Norco  -states no diagnosis code, also question regarding initial fill or for chronic

## 2019-02-24 NOTE — Progress Notes (Signed)
Patient ID: Bonnie Powers, female   DOB: [birth year], 73 y.o.   MRN: [id number]  Postop day 1 status post right total knee patient complains of pain in her right hip posterior aspect right leg and ankle also complains of right knee pain  BP (!) 107/54 (BP Location: Left Arm)   Pulse 69   Temp 98.1 F (36.7 C) (Oral)   Resp 18   Ht 5' 6"  (1.676 m)   Wt 88.6 kg   SpO2 94%   BMI 31.53 kg/m   Awake and alert  Neurovascular exam intact  Appears to be having some sciatic nerve irritation causing increased pain in the right leg  Recommend continue ice therapy to the knee Robaxin every 6 hours We will given second dose of steroids Depending on therapy patient will discharge after 2 PM today or will stay in extended day for better control of pain Dressing will be changed today

## 2019-02-24 NOTE — Care Management Obs Status (Signed)
[place] NOTIFICATION   Patient Details  Name: Bonnie Powers MRN: [id number] Date of Birth: Jun 22, 1945   Medicare Observation Status Notification Given:  Yes    Yunis Voorheis, Chauncey Reading, RN 02/24/2019, 11:58 AM

## 2019-02-24 NOTE — Plan of Care (Signed)

## 2019-02-24 NOTE — Progress Notes (Signed)
Nsg Discharge Note  Admit Date:  02/23/2019 Discharge date: 02/24/2019   [name] to be D/C'd home per MD order.  AVS completed.  Copy for chart, and copy for patient signed, and dated. Patient/caregiver able to verbalize understanding.  Discharge Medication: Allergies as of 02/24/2019      Reactions   Statins Other (See Comments)   Muscle aches   Aspirin    ULCERS      Medication List    STOP taking these medications   docusate sodium 100 MG capsule Commonly known as: COLACE     TAKE these medications   ALPRAZolam 0.5 MG tablet Commonly known as: XANAX Take 0.5 mg by mouth daily as needed for anxiety.   aspirin 81 MG EC tablet Commonly known as: Ecotrin Low Strength Take 1 tablet (81 mg total) by mouth 2 (two) times daily. Swallow whole. What changed:   when to take this  additional instructions   azaTHIOprine 50 MG tablet Commonly known as: IMURAN TAKE 3 TABLETS (150 MG) BY MOUTH DAILY AS DIRECTED What changed:   how much to take  how to take this  when to take this  additional instructions   B-12 2500 MCG Tabs Take 2,500 mcg by mouth daily.   Calcium-Vitamin D 600-400 MG-UNIT Tabs Take 1 tablet by mouth daily.   cholecalciferol 1000 units tablet Commonly known as: VITAMIN D Take 1,000 Units by mouth daily.   hydrochlorothiazide 12.5 MG capsule Commonly known as: MICROZIDE Take 12.5 mg by mouth daily.   HYDROcodone-acetaminophen 7.5-325 MG tablet Commonly known as: NORCO Take 1-2 tablets by mouth every 4 (four) hours as needed for severe pain (pain score 7-10).   Magnesium 250 MG Tabs Take 250 mg by mouth daily.   methocarbamol 500 MG tablet Commonly known as: ROBAXIN Take 1 tablet (500 mg total) by mouth 4 (four) times daily.   metoprolol tartrate 25 MG tablet Commonly known as: LOPRESSOR Take 12.5 mg by mouth 2 (two) times daily.   multivitamin with minerals Tabs tablet Take 1 tablet by mouth daily.   nitroGLYCERIN 0.4 MG  SL tablet Commonly known as: NITROSTAT Place 1 tablet (0.4 mg total) under the tongue every 5 (five) minutes as needed for chest pain.   PROBIOTIC FORMULA PO Take 1 tablet by mouth every evening.   vitamin C 500 MG tablet Commonly known as: ASCORBIC ACID Take 500 mg by mouth daily.       Discharge Assessment: Vitals:   02/24/19 0621 02/24/19 1443  BP: (!) 107/54 (!) 132/43  Pulse: 69 68  Resp: 18 20  Temp: 98.1 F (36.7 C) 97.8 F (36.6 C)  SpO2: 94% 95%   Skin clean, dry and intact without evidence of skin break down, no evidence of skin tears noted. IV catheter discontinued intact. Site without signs and symptoms of complications - no redness or edema noted at insertion site, patient denies c/o pain - only slight tenderness at site.  Dressing with slight pressure applied.  D/c Instructions-Education: Discharge instructions given to patient/family with verbalized understanding. D/c education completed with patient/family including follow up instructions, medication list, d/c activities limitations if indicated, with other d/c instructions as indicated by MD - patient able to verbalize understanding, all questions fully answered. Patient instructed to return to ED, call 911, or call MD for any changes in condition.  Patient escorted via [place], and D/C home via private auto.  Venita Sheffield, RN 02/24/2019 4:11 PM

## 2019-02-24 NOTE — TOC Transition Note (Addendum)
Transition of Care [facility]) - CM/SW Discharge Note   Patient Details  Name: [name] MRN: [id number] Date of Birth: 19-Oct-1945  [facility]) CM/SW Contact:  Basilio Meadow, Chauncey Reading, RN Phone Number: 02/24/2019, 11:52 AM   Clinical Narrative:   Patient discharging home today, referral for [facility] PT previously set up by orthopedic office. Patient agreeable. No DME needs. Has RW, wants [facility] for PT.   Expected Discharge Plan: [place] Barriers to Discharge: Barriers Resolved   Patient Goals and CMS Choice     Choice offered to / list presented to : Patient  Expected Discharge Plan and Services Expected Discharge Plan: [place] Acute Care Choice: Home Health   Expected Discharge Date: 02/24/19                         HH Arranged: PT [place]: [facility]now [facility]) Date [place]: 02/24/19 Time [place]: 67 Representative spoke with at [place]: [place] [facility] Devices/Equipment: Eyeglasses, Radio producer (specify quad or straight), Wheelchair, Blood pressure cuff, Shower chair with back, Raised toilet seat with rails ADL Screening (condition at time of admission) Patient's cognitive ability adequate to safely complete daily activities?: Yes Is the patient deaf or have difficulty hearing?: Yes Does the patient have difficulty seeing, even when wearing glasses/contacts?: No Does the patient have difficulty concentrating, remembering, or making decisions?: No Patient able to express need for assistance with ADLs?: Yes Does the patient have difficulty dressing or bathing?: No Independently performs ADLs?: Yes (appropriate for developmental age) Does the patient have difficulty walking or climbing stairs?: Yes Weakness of Legs: Right Weakness of Arms/Hands: None           Admission diagnosis:  osteoarthritis right knee Patient Active Problem  List   Diagnosis Date Noted  . History of total right knee replacement 02/23/2019  . Primary osteoarthritis of right knee   . S/P TKR (total knee replacement), left 09/15/18 09/28/2018  . Primary osteoarthritis of left knee   . Flatulence 03/04/2018  . Acute blood loss anemia 04/07/2014  . CAD (coronary artery disease) 02/25/2014  . NSTEMI (non-ST elevated myocardial infarction) ([place]) 02/23/2014  . Essential hypertension 02/23/2014  . Hyperlipidemia 02/23/2014  . Tobacco abuse 02/23/2014  . Sinusitis, acute 12/14/2012  . UC (ulcerative colitis) ([place]) 08/17/2010  . ULCERATIVE COLITIS--UNIVERSAL ULCERATIVE COLITIS 11/07/2009  . RECTAL BLEEDING 11/07/2009  . Diarrhea 11/07/2009   PCP:  Renee Rival, NP Pharmacy:   [facility] [address] [place] [address] Phone: [phone] Fax: [phone]     Final next level of care: Home w Home Health Services Barriers to Discharge: Barriers Resolved   Patient Goals and CMS Choice     Choice offered to / list presented to : Patient                     HH Arranged: PT [place] Agency: [facility] (now [facility]) Date [place]: 02/24/19 Time [place]: 1152 Representative spoke with at [place]: [place] ([place]) Interventions     Readmission Risk Interventions Readmission Risk Prevention Plan 09/17/2018  Medication Screening Complete  Transportation Screening Complete  Some recent data might be hidden

## 2019-02-24 NOTE — Progress Notes (Signed)
Physical Therapy Treatment Patient Details Name: Bonnie Powers MRN: [id number] DOB: 03/26/1946 Today's Date: 02/24/2019   RIGHT KNEE ROM: 2 - 110 degrees AMBULATION DISTANCE: 80 feet using RW with Supervision    History of Present Illness Bonnie Powers is a 73 y/o female s/p Right TKA 02/23/19 with OA right knee, with  hx of recent Left TKA 09/15/18, CABG graft in 2015 for non-STEMI MI w/2 blockages, history of Anxiety, Basal cell cancer, Coronary artery disease, Depression, Headache, Hepatitis (1972), HTN (hypertension), cataract surgery (09/11/2015), Hypercholesteremia, OA (osteoarthritis), Otitis media, chronic, and Ulcerative colitis (2001).    PT Comments    Patient demonstrates increased endurance/distance for ambulation with fair/good return for right heel to toe stepping without loss of balance, limited for ambulation and unable to attempt stair training due to c/o fatigue and increasing right knee pain, achieved increased right knee flexion during self stretching while seated at bedside and tolerated sitting up in chair with RLE dangling after therapy.  Patient does have ramp entrance at home.  Patient will benefit from continued physical therapy in hospital and recommended venue below to increase strength, balance, endurance for safe ADLs and gait.   Follow Up Recommendations  Home health PT;Supervision for mobility/OOB;Supervision - Intermittent     Equipment Recommendations  None recommended by PT    Recommendations for Other Services       Precautions / Restrictions Precautions Precautions: Fall Restrictions Weight Bearing Restrictions: Yes RLE Weight Bearing: Weight bearing as tolerated    Mobility  Bed Mobility Overal bed mobility: Modified Independent Bed Mobility: Supine to Sit     Supine to sit: Modified independent (Device/Increase time)     General bed mobility comments: demonstrates good return for moving RLE when sittting up at  bedside  Transfers Overall transfer level: Needs assistance Equipment used: Rolling walker (2 wheeled) Transfers: Sit to/from [facility] Sit to Stand: Modified independent (Device/Increase time);Supervision Stand pivot transfers: Modified independent (Device/Increase time);Supervision       General transfer comment: increased time, slightly labored movement  Ambulation/Gait Ambulation/Gait assistance: Supervision Gait Distance (Feet): 80 Feet Assistive device: Rolling walker (2 wheeled) Gait Pattern/deviations: Decreased step length - right;Decreased stance time - right;Decreased stride length Gait velocity: decreased   General Gait Details: increased endurance/distance for ambulation with fair/good return for right heel to toe stepping without loss of balance, limited mostly due to c/o fatigue and increasing right knee pain   Stairs             Wheelchair Mobility    Modified Rankin (Stroke Patients Only)       Balance Overall balance assessment: Needs assistance Sitting-balance support: Feet supported;No upper extremity supported Sitting balance-Leahy Scale: Good Sitting balance - Comments: seated at EOB   Standing balance support: During functional activity;Bilateral upper extremity supported Standing balance-Leahy Scale: Fair Standing balance comment: fair/good using RW                            Cognition Arousal/Alertness: Awake/alert Behavior During Therapy: WFL for tasks assessed/performed Overall Cognitive Status: Within Functional Limits for tasks assessed                                        Exercises Total Joint Exercises Ankle Circles/Pumps: Supine;10 reps;Right;Strengthening;AROM Quad Sets: Supine;10 reps;Right;Strengthening;AROM Short Arc Quad: Supine;10 reps;Right;Strengthening;AROM Heel Slides: Supine;10 reps;Right;Strengthening;AROM Goniometric ROM:  right knee: 2 - 110 degrees    General  Comments        Pertinent Vitals/Pain Pain Assessment: Faces Faces Pain Scale: Hurts even more Pain Location: right knee Pain Descriptors / Indicators: Sore Pain Intervention(s): Limited activity within patient's tolerance;Monitored during session;Premedicated before session    Home Living                      Prior Function            PT Goals (current goals can now be found in the care plan section) Acute Rehab PT Goals Patient Stated Goal: return home PT Goal Formulation: With patient Time For Goal Achievement: 02/26/19 Potential to Achieve Goals: Good Progress towards PT goals: Progressing toward goals    Frequency    BID      PT Plan Current plan remains appropriate    Co-evaluation              AM-PAC PT "6 Clicks" Mobility   Outcome Measure  Help needed turning from your back to your side while in a flat bed without using bedrails?: None Help needed moving from lying on your back to sitting on the side of a flat bed without using bedrails?: None Help needed moving to and from a bed to a chair (including a wheelchair)?: A Little Help needed standing up from a chair using your arms (e.g., wheelchair or bedside chair)?: A Little Help needed to walk in hospital room?: A Little Help needed climbing 3-5 steps with a railing? : A Little 6 Click Score: 20    End of Session   Activity Tolerance: Patient tolerated treatment well;Patient limited by fatigue;Patient limited by pain Patient left: in chair;with call bell/phone within reach Nurse Communication: Mobility status PT Visit Diagnosis: Unsteadiness on feet (R26.81);Other abnormalities of gait and mobility (R26.89);Muscle weakness (generalized) (M62.81)     Time: 8003-4917 PT Time Calculation (min) (ACUTE ONLY): 26 min  Charges:  $Gait Training: 8-22 mins $Therapeutic Exercise: 8-22 mins                     10:59 AM, 02/24/19 Lonell Grandchild, MPT Physical Therapist with [facility] 336 [phone] office [phone] mobile phone

## 2019-02-24 NOTE — Discharge Summary (Signed)
Physician Discharge Summary  Patient ID: Bonnie Powers MRN: [id number] DOB/AGE: 10-07-1945 73 y.o.  Admit date: 02/23/2019 Discharge date: 02/24/2019  Admission Diagnoses: Osteoarthritis right knee  Discharge Diagnoses: Same  Discharged Condition: stable  Procedure Stable right total knee with DePuy Sigma fixed bearing total knee  Hospital Course:  Patient tolerated the procedure well on the day of admission the 17th tolerated physical therapy on the 18th remained afebrile walked 100 feet had over 100 degrees of flexion  She did have some what we thought was radicular pain in the right side which we treated with IV steroids and Robaxin   CBC Latest Ref Rng & Units 02/24/2019 02/19/2019 09/16/2018  WBC 4.0 - 10.5 K/uL 8.0 4.6 7.3  Hemoglobin 12.0 - 15.0 g/dL 10.1(L) 11.6(L) 10.5(L)  Hematocrit 36.0 - 46.0 % 31.8(L) 37.3 33.5(L)  Platelets 150 - 400 K/uL 235 273 226   BMP Latest Ref Rng & Units 02/24/2019 02/19/2019 09/16/2018  Glucose 70 - 99 mg/dL 114(H) 103(H) 110(H)  BUN 8 - 23 mg/dL 13 16 15   Creatinine 0.44 - 1.00 mg/dL 0.79 0.89 0.77  BUN/Creat Ratio 6 - 22 (calc) - - -  Sodium 135 - 145 mmol/L 137 138 135  Potassium 3.5 - 5.1 mmol/L 3.5 3.8 3.8  Chloride 98 - 111 mmol/L 104 103 102  CO2 22 - 32 mmol/L 26 25 25   Calcium 8.9 - 10.3 mg/dL 9.4 10.1 9.2     Discharge Exam: BP (!) 107/54 (BP Location: Left Arm)   Pulse 69   Temp 98.1 F (36.7 C) (Oral)   Resp 18   Ht 5' 6"  (1.676 m)   Wt 88.6 kg   SpO2 94%   BMI 31.53 kg/m  Physical Exam    Disposition: Discharge disposition: 01-Home or Self Care       Discharge Instructions    Call MD / Call 911   Complete by: As directed    If you experience chest pain or shortness of breath, CALL 911 and be transported to the hospital emergency room.  If you develope a fever above 101 F, pus (white drainage) or increased drainage or redness at the wound, or calf pain, call your surgeon's office.   Constipation  Prevention   Complete by: As directed    Drink plenty of fluids.  Prune juice may be helpful.  You may use a stool softener, such as Colace (over the counter) 100 mg twice a day.  Use MiraLax (over the counter) for constipation as needed.   Diet - low sodium heart healthy   Complete by: As directed    Discharge instructions   Complete by: As directed    Blue bone foam 30 minutes 3 times a day  CPM 6 hours/day return at 2 weeks, start at 100 degrees stay at 100 degrees  Wear stockings for 2 weeks  Wear Cryo/Cuff for 30 minutes 6 times a day  No driving for 4 weeks   No shower for 2 weeks   Increase activity slowly as tolerated   Complete by: As directed      Allergies as of 02/24/2019      Reactions   Statins Other (See Comments)   Muscle aches   Aspirin    ULCERS      Medication List    STOP taking these medications   docusate sodium 100 MG capsule Commonly known as: COLACE     TAKE these medications   ALPRAZolam 0.5 MG tablet Commonly known as: Duanne Moron  Take 0.5 mg by mouth daily as needed for anxiety.   aspirin 81 MG EC tablet Commonly known as: Ecotrin Low Strength Take 1 tablet (81 mg total) by mouth 2 (two) times daily. Swallow whole. What changed:   when to take this  additional instructions   azaTHIOprine 50 MG tablet Commonly known as: IMURAN TAKE 3 TABLETS (150 MG) BY MOUTH DAILY AS DIRECTED What changed:   how much to take  how to take this  when to take this  additional instructions   B-12 2500 MCG Tabs Take 2,500 mcg by mouth daily.   Calcium-Vitamin D 600-400 MG-UNIT Tabs Take 1 tablet by mouth daily.   cholecalciferol 1000 units tablet Commonly known as: VITAMIN D Take 1,000 Units by mouth daily.   hydrochlorothiazide 12.5 MG capsule Commonly known as: MICROZIDE Take 12.5 mg by mouth daily.   HYDROcodone-acetaminophen 7.5-325 MG tablet Commonly known as: NORCO Take 1-2 tablets by mouth every 4 (four) hours as needed for  severe pain (pain score 7-10).   Magnesium 250 MG Tabs Take 250 mg by mouth daily.   methocarbamol 500 MG tablet Commonly known as: ROBAXIN Take 1 tablet (500 mg total) by mouth 4 (four) times daily.   metoprolol tartrate 25 MG tablet Commonly known as: LOPRESSOR Take 12.5 mg by mouth 2 (two) times daily.   multivitamin with minerals Tabs tablet Take 1 tablet by mouth daily.   nitroGLYCERIN 0.4 MG SL tablet Commonly known as: NITROSTAT Place 1 tablet (0.4 mg total) under the tongue every 5 (five) minutes as needed for chest pain.   PROBIOTIC FORMULA PO Take 1 tablet by mouth every evening.   vitamin C 500 MG tablet Commonly known as: ASCORBIC ACID Take 500 mg by mouth daily.        Signed: Arther Abbott 02/24/2019, 11:47 AM

## 2019-02-24 NOTE — Progress Notes (Signed)
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    PT Comments    Patient presents seated in chair and agreeable for therapy in PM.  Patient demonstrates good return for completing LAQ's /marching in place with RLE without increase in right knee pain, improvement for sit to stands/transfers using RW, had one stumble during gait training resulting use of side rail in hallway and patient advised to keep both hands on RW during ambulation with understanding acknowledged.  Patient limited for ambulation due to c/o fatigue and tolerated staying up in chair after therapy.  Patient will benefit from continued physical therapy in hospital and recommended venue below to increase strength, balance, endurance for safe ADLs and gait.   Follow Up Recommendations  Home health PT;Supervision for mobility/OOB;Supervision - Intermittent     Equipment Recommendations  None recommended by PT    Recommendations for Other Services       Precautions / Restrictions Precautions Precautions: Fall Restrictions Weight Bearing Restrictions: Yes RLE Weight Bearing: Weight bearing as tolerated    Mobility  Bed Mobility Overal bed mobility: Modified Independent Bed Mobility: Supine to Sit     Supine to sit: Modified independent (Device/Increase time)     General bed mobility comments: Patient presents up in  chair  Transfers Overall transfer level: Modified independent Equipment used: Rolling walker (2 wheeled) Transfers: Sit to/from [facility] Sit to Stand: Modified independent (Device/Increase time) Stand pivot transfers: Modified independent (Device/Increase time)       General transfer comment: demonstrates proper hand placement and good body mechanics for sit to stands/transfers  Ambulation/Gait Ambulation/Gait assistance: Supervision Gait Distance (Feet): 80 Feet Assistive device: Rolling walker (2 wheeled) Gait Pattern/deviations: Decreased step length - right;Decreased stance time - right;Decreased stride length Gait velocity: decreased   General Gait Details: slightly labored cadence with fair/good return for right heel toe stepping, had one stumble requiring use of side rail in hallway without loss of balance, advised to always keep hands on RW with understanding ackonwleged   Stairs             Wheelchair Mobility    Modified Rankin (Stroke Patients Only)       Balance Overall balance assessment: Needs assistance Sitting-balance support: Feet supported;No upper extremity supported Sitting balance-Leahy Scale: Good Sitting balance - Comments: seated at EOB   Standing balance support: During functional activity;Bilateral upper extremity supported Standing balance-Leahy Scale: Fair Standing balance comment: fair/good using RW                            Cognition Arousal/Alertness: Awake/alert Behavior During Therapy: WFL for tasks assessed/performed Overall Cognitive Status: Within Functional Limits for tasks assessed                                        Exercises Total Joint Exercises  Ankle Circles/Pumps: Supine;10 reps;Right;Strengthening;AROM Quad Sets: Supine;10 reps;Right;Strengthening;AROM Short Arc Quad: Supine;10 reps;Right;Strengthening;AROM Heel Slides: Supine;10  reps;Right;Strengthening;AROM Goniometric ROM: right knee: 2 - 110 degrees General Exercises - Lower Extremity Long Arc Quad: Seated;AROM;Strengthening;Both;10 reps Hip Flexion/Marching: Seated;AROM;Strengthening;Both;10 reps Toe Raises: Seated;AROM;Strengthening;10 reps;Both Heel Raises: Seated;AROM;Strengthening;Both;10 reps    General Comments        Pertinent Vitals/Pain Pain Assessment: 0-10 Pain Score: 5  Faces Pain Scale: Hurts even more Pain Location: right knee Pain Descriptors / Indicators: Sore Pain Intervention(s): Limited activity within patient's tolerance;Monitored during session    Home Living                      Prior Function            PT Goals (current goals can now be found in the care plan section) Acute Rehab PT Goals Patient Stated Goal: return home PT Goal Formulation: With patient Time For Goal Achievement: 02/24/19 Potential to Achieve Goals: Good Progress towards PT goals: Progressing toward goals    Frequency    BID      PT Plan Current plan remains appropriate    Co-evaluation              AM-PAC PT "6 Clicks" Mobility   Outcome Measure  Help needed turning from your back to your side while in a flat bed without using bedrails?: None Help needed moving from lying on your back to sitting on the side of a flat bed without using bedrails?: None Help needed moving to and from a bed to a chair (including a wheelchair)?: None Help needed standing up from a chair using your arms (e.g., wheelchair or bedside chair)?: None Help needed to walk in hospital room?: A Little Help needed climbing 3-5 steps with a railing? : A Little 6 Click Score: 22    End of Session   Activity Tolerance: Patient tolerated treatment well;Patient limited by fatigue;Patient limited by pain Patient left: in chair;with call bell/phone within reach Nurse Communication: Mobility status PT Visit Diagnosis: Unsteadiness on feet (R26.81);Other  abnormalities of gait and mobility (R26.89);Muscle weakness (generalized) (M62.81)     Time: 9450-3888 PT Time Calculation (min) (ACUTE ONLY): 27 min  Charges:  $Gait Training: 8-22 mins $Therapeutic Exercise: 8-22 mins                     2:43 PM, 02/24/19 Lonell Grandchild, MPT Physical Therapist with [facility] [phone] office [phone] mobile phone

## 2019-02-24 NOTE — Telephone Encounter (Signed)
She is s/p total knee replacement 02/23/2019

## 2019-02-26 ENCOUNTER — Other Ambulatory Visit: Payer: Self-pay | Admitting: Orthopedic Surgery

## 2019-02-26 ENCOUNTER — Telehealth: Payer: Self-pay | Admitting: Orthopedic Surgery

## 2019-02-26 ENCOUNTER — Telehealth: Payer: Self-pay

## 2019-02-26 MED ORDER — OXYCODONE-ACETAMINOPHEN 5-325 MG PO TABS: 1.0000 | ORAL_TABLET | Freq: Four times a day (QID) | ORAL | 0 refills | Status: AC | PRN

## 2019-02-26 NOTE — Progress Notes (Signed)
Meds ordered this encounter  Medications  . oxyCODONE-acetaminophen (PERCOCET/ROXICET) 5-325 MG tablet    Sig: Take 1 tablet by mouth every 6 (six) hours as needed for up to 7 days for severe pain.    Dispense:  28 tablet    Refill:  0

## 2019-02-26 NOTE — Telephone Encounter (Signed)
Patient is concerned about, and following up on, status of home care, [facility]. States her insurer 'doesn't have any information from anyone yet.' Please advise.

## 2019-02-26 NOTE — Telephone Encounter (Signed)
Patient called stating she is in excruciating pain and wants to be sure Dr. Aline Brochure is aware of it. She is also asking for a refill of pain medication.  Hydrocodone-Acetaminophen  7.5/325mg  Qty 30 Tablets  Take 1-2 tablets by mouth every 4(four) hours as needed for severe pain (pain score 7-10).  PATIENT USES [facility] IN [place], [place] ON [address]

## 2019-02-26 NOTE — Telephone Encounter (Signed)
They have the orders, I have sent email to my contact person there to give her a call.

## 2019-03-01 ENCOUNTER — Telehealth: Payer: Self-pay

## 2019-03-01 NOTE — Telephone Encounter (Signed)
I spoke to patient/ she has refused CPM due to cost, she does not want to use it, states last time she was claustrophobic.   I have encouraged her to bend her knee and extend it also, she states she will  If we can not get home therapy lined up for her she will need to go to outpatient she states she will   I will check on the therapy tomorrow.

## 2019-03-01 NOTE — Telephone Encounter (Signed)
Patient called wanting to speak with someone to help her about [facility] therapy. States she has not heard anything from them and she is really concerned. States the back of her leg is swelling up/black and blue some.   Please call and advise

## 2019-03-01 NOTE — Telephone Encounter (Signed)
Tim email replied call Joen Laura at [phone] if immediate help needed. I called her to see why therapy has not started yet.   To Dr Aline Brochure / bruising/ swelling

## 2019-03-02 NOTE — Telephone Encounter (Signed)
Bonnie Powers will go out

## 2019-03-02 NOTE — Telephone Encounter (Signed)
Joen Laura from [facility] called you back and requests that you call her regarding setting up  this patient   Her phone number is [phone]

## 2019-03-08 ENCOUNTER — Other Ambulatory Visit: Payer: Self-pay | Admitting: Orthopedic Surgery

## 2019-03-08 ENCOUNTER — Telehealth: Payer: Self-pay

## 2019-03-08 DIAGNOSIS — Z96651 Presence of right artificial knee joint: Secondary | ICD-10-CM

## 2019-03-08 MED ORDER — HYDROCODONE-ACETAMINOPHEN 5-325 MG PO TABS: 1.0000 | ORAL_TABLET | Freq: Four times a day (QID) | ORAL | 0 refills | Status: DC | PRN

## 2019-03-08 NOTE — Progress Notes (Signed)
Meds ordered this encounter  Medications  . HYDROcodone-acetaminophen (NORCO/VICODIN) 5-325 MG tablet    Sig: Take 1 tablet by mouth every 6 (six) hours as needed for up to 7 days for moderate pain.    Dispense:  28 tablet    Refill:  0    Encounter Diagnosis  Name Primary?  . History of total right knee replacement Yes

## 2019-03-08 NOTE — Telephone Encounter (Signed)
Joey with [facility] left message wanting to speak with Amy about PT for this patient. 1x for 1 week, 3 x for 1 week, 2x for 1 week. Please call and advise  [phone]

## 2019-03-08 NOTE — Telephone Encounter (Signed)
Called with Verbal orders. Bonnie Powers said she is having pain not wanting to take pain meds but he is not sure why  I have called her about the hydrocodone. She states it helped but she ran out would like refill

## 2019-03-09 DIAGNOSIS — Z96651 Presence of right artificial knee joint: Secondary | ICD-10-CM | POA: Insufficient documentation

## 2019-03-10 ENCOUNTER — Encounter: Payer: Self-pay | Admitting: Orthopedic Surgery

## 2019-03-10 ENCOUNTER — Ambulatory Visit (INDEPENDENT_AMBULATORY_CARE_PROVIDER_SITE_OTHER): Payer: Medicare HMO | Admitting: Orthopedic Surgery

## 2019-03-10 ENCOUNTER — Other Ambulatory Visit: Payer: Self-pay

## 2019-03-10 DIAGNOSIS — Z96651 Presence of right artificial knee joint: Secondary | ICD-10-CM

## 2019-03-10 MED ORDER — HYDROCODONE-ACETAMINOPHEN 5-325 MG PO TABS: 1.0000 | ORAL_TABLET | ORAL | 0 refills | Status: DC | PRN

## 2019-03-10 NOTE — Progress Notes (Signed)
Chief Complaint  Patient presents with  . Routine Post Op    02/23/2019 right knee replacement pain meds not helping  burning pains    This is the first postop visit status post right total knee for [facility]  Admitted on the 17th discharged on the 18th  Hemoglobin was 10  She is on aspirin for DVT prevention  She is complaining of pain right knee uncontrolled with 5 mg hydrocodone every 6 hours and Robaxin 500 every 4 hours as needed  Her knee looks good her staples were extracted she has full extension and flexes her knee past 95 degrees she is ambulatory with a walker  There are no signs of DVT or infection  Recommend hydrocodone 5 mg 1 to 2 tablets every 4 hours as needed  Start outpatient therapy downstairs after the 14th  Follow-up here in 3 weeks

## 2019-03-22 ENCOUNTER — Other Ambulatory Visit: Payer: Self-pay | Admitting: Orthopedic Surgery

## 2019-03-22 DIAGNOSIS — Z96651 Presence of right artificial knee joint: Secondary | ICD-10-CM

## 2019-03-22 MED ORDER — HYDROCODONE-ACETAMINOPHEN 5-325 MG PO TABS: 1.0000 | ORAL_TABLET | ORAL | 0 refills | Status: DC | PRN

## 2019-03-22 NOTE — Telephone Encounter (Signed)
Patient requests refill on Hydrocodone/Acetaminophen 5-325  Mgs.  Qty  40  Sig: Take 1-2 tablets by mouth every 4 (four) hours as needed for moderate pain.       [facility] uses [facility] on [address] in [place]

## 2019-03-31 ENCOUNTER — Other Ambulatory Visit: Payer: Self-pay

## 2019-03-31 ENCOUNTER — Ambulatory Visit (INDEPENDENT_AMBULATORY_CARE_PROVIDER_SITE_OTHER): Payer: Medicare HMO | Admitting: Orthopedic Surgery

## 2019-03-31 ENCOUNTER — Ambulatory Visit: Payer: Medicare HMO

## 2019-03-31 VITALS — BP 134/81 | HR 84 | Temp 97.1°F | Ht 66.0 in | Wt 190.0 lb

## 2019-03-31 DIAGNOSIS — M545 Low back pain, unspecified: Secondary | ICD-10-CM

## 2019-03-31 DIAGNOSIS — Z96651 Presence of right artificial knee joint: Secondary | ICD-10-CM | POA: Diagnosis not present

## 2019-03-31 MED ORDER — HYDROCODONE-ACETAMINOPHEN 5-325 MG PO TABS: 1.0000 | ORAL_TABLET | Freq: Four times a day (QID) | ORAL | 0 refills | Status: DC | PRN

## 2019-03-31 NOTE — Patient Instructions (Signed)
Recommend Dr Larrie Kass

## 2019-03-31 NOTE — Progress Notes (Signed)
Postoperative visit  The patient had a right total knee on February 23, 2019 this is the roughly 6-week follow-up visit she is regained 120 degrees of knee flexion knee comes to full extension she has excellent strength  However she complains of severe right knee and leg pain with some thigh pain and lower leg pain in the anterior compartment she has issues with standing for long periods of time and pain when she gets out of the chair and the quadriceps  She has a history of chiropractic manipulations and adjustments but denies any current back pain  Review of systems no bowel or bladder dysfunction at this time  Examination reveals a healthy well-developed well-nourished female grooming and hygiene are intact again her right knee function is normal  She has tenderness in the lower back with what appears to be a negative straight leg raise the back tenderness is on the medial lateral SI joints midline and right posterior thigh she also has tenderness in the lateral compartment of the leg  No swelling of the right lower extremity is noted  Imaging of the lumbar spine shows she has reactive or degenerative type scoliosis with facet arthritis mild to moderate  Encounter Diagnoses  Name Primary?  . S/P total knee replacement, right 02/23/2019 Yes  . Lumbar pain     Her knee looks great I am happy with her knee we can see her in 2 months for that  I recommended a chiropractor to see her again to see if we can adjust her back in any of her leg pain will go away  Follow-up in 2 months

## 2019-05-18 ENCOUNTER — Other Ambulatory Visit: Payer: Self-pay

## 2019-05-18 MED ORDER — METOPROLOL TARTRATE 25 MG PO TABS: 12.5000 mg | ORAL_TABLET | Freq: Two times a day (BID) | ORAL | 3 refills | Status: DC

## 2019-05-18 NOTE — Telephone Encounter (Signed)
refilled lopressor

## 2019-05-20 ENCOUNTER — Other Ambulatory Visit: Payer: Self-pay

## 2019-05-20 MED ORDER — METOPROLOL TARTRATE 25 MG PO TABS: 12.5000 mg | ORAL_TABLET | Freq: Two times a day (BID) | ORAL | 3 refills | Status: DC

## 2019-05-20 NOTE — Telephone Encounter (Signed)
Refilled lopressor to [facility]

## 2019-05-25 ENCOUNTER — Telehealth: Payer: Self-pay

## 2019-05-25 DIAGNOSIS — K51011 Ulcerative (chronic) pancolitis with rectal bleeding: Secondary | ICD-10-CM

## 2019-05-25 MED ORDER — AZATHIOPRINE 50 MG PO TABS: ORAL_TABLET | ORAL | 3 refills | Status: DC

## 2019-05-25 NOTE — Addendum Note (Signed)
Addended by: Mahala Menghini on: 05/25/2019 08:17 PM   Modules accepted: Orders

## 2019-05-25 NOTE — Telephone Encounter (Signed)
rx sent

## 2019-05-25 NOTE — Telephone Encounter (Signed)
Letter received that pt is new to [facility] mail order and has requested to have prescription for Imuran sent to them.   Forwarding to refill box.

## 2019-05-31 NOTE — Telephone Encounter (Signed)
LMOM to call.

## 2019-06-01 ENCOUNTER — Other Ambulatory Visit: Payer: Self-pay

## 2019-06-01 DIAGNOSIS — K51011 Ulcerative (chronic) pancolitis with rectal bleeding: Secondary | ICD-10-CM

## 2019-06-01 NOTE — Telephone Encounter (Signed)
Pt is aware and has already received the meds.

## 2019-06-02 MED ORDER — AZATHIOPRINE 50 MG PO TABS: ORAL_TABLET | ORAL | 3 refills | Status: DC

## 2019-06-07 ENCOUNTER — Other Ambulatory Visit: Payer: Self-pay

## 2019-06-07 ENCOUNTER — Ambulatory Visit: Payer: Medicare HMO | Admitting: Orthopedic Surgery

## 2019-06-07 ENCOUNTER — Encounter: Payer: Self-pay | Admitting: Orthopedic Surgery

## 2019-06-07 VITALS — BP 139/76 | HR 82 | Ht 66.0 in | Wt 180.0 lb

## 2019-06-07 DIAGNOSIS — Z96651 Presence of right artificial knee joint: Secondary | ICD-10-CM

## 2019-06-07 NOTE — Progress Notes (Signed)
Chief Complaint  Patient presents with  . Post-op Follow-up    right knee replacement 02/23/19    C/o difficulty getting out of chair  Soreness behind right knee   Knees rom 110 right 120 left  Full extension both   No diagnosis found.  Fu 3 months

## 2019-06-07 NOTE — Patient Instructions (Signed)
Do some exercises for the knees daily

## 2019-06-14 ENCOUNTER — Encounter: Payer: Self-pay | Admitting: Gastroenterology

## 2019-06-14 ENCOUNTER — Other Ambulatory Visit: Payer: Self-pay

## 2019-06-14 ENCOUNTER — Ambulatory Visit: Payer: Medicare HMO | Admitting: Gastroenterology

## 2019-06-14 VITALS — BP 143/64 | HR 80 | Temp 98.2°F | Ht 66.0 in | Wt 184.2 lb

## 2019-06-14 DIAGNOSIS — K5100 Ulcerative (chronic) pancolitis without complications: Secondary | ICD-10-CM

## 2019-06-14 NOTE — Patient Instructions (Signed)
1. Please call in a couple of days and let me know how you are feeling so we can decide if we need to start a steroid for your colitis.  2. You can take imodium 75m twice a day for diarrhea over the next several days if needed.  3. Return office visit in July 2021 to schedule your two year follow up colonoscopy.

## 2019-06-14 NOTE — Progress Notes (Signed)
Primary Care Physician: Renee Rival, NP  Primary Gastroenterologist:  Garfield Cornea, MD   Chief Complaint  Patient presents with  . Diarrhea    started friday 5-6 times per day  . Abdominal Pain    " cramping" along lower abd. no nausea, no vomiting    HPI: Bonnie Powers is a 74 y.o. female here for follow-up.  Last seen in September 2020.  She has a history of UC.  Last colonoscopy August 2019, diffuse granular/pale colonic mucosa, couple tiny erosions in the ascending and descending segments, rectum less involved but not completely normal.  Multiple erosions in the TI.  Segmental biopsies with mildly active inflammation of the TI, sigmoid colon, active chronic proctitis, colitis of the descending colon.  Next colonoscopy planned for August 2021.  From GI standpoint had been doing well until recently. Acute onset abdominal cramping, diarrhea Friday. No recent antibiotics. No ill contacts. Little discomfort after BM. No blood or mucous in the stool. No fever. No vomiting.  Friday had 5-6 bowel movements.  Saturday one all day with only one bowel movement.  Yesterday had 4-5 bowel movements.  Today her stool has been a little more formed.  Believes it is related to significant stress, recently reliving her daughter's death.  Today is her and her deceased daughter's birthday.  Has been pretty emotional over the past week.  Denies any nausea or vomiting.  No upper GI symptoms.  Goes for second dose of covid vaccine on Wednesday.     Current Outpatient Medications  Medication Sig Dispense Refill  . ALPRAZolam (XANAX) 0.5 MG tablet Take 0.5 mg by mouth daily as needed for anxiety.    Marland Kitchen aspirin (ECOTRIN LOW STRENGTH) 81 MG EC tablet Take 1 tablet (81 mg total) by mouth 2 (two) times daily. Swallow whole. 60 tablet 0  . azaTHIOprine (IMURAN) 50 MG tablet TAKE 3 TABLETS (150 MG) BY MOUTH DAILY AS DIRECTED 270 tablet 3  . Calcium Carb-Cholecalciferol (CALCIUM-VITAMIN D) 600-400  MG-UNIT TABS Take 1 tablet by mouth daily.    . cholecalciferol (VITAMIN D) 1000 UNITS tablet Take 1,000 Units by mouth daily.    . Cyanocobalamin (B-12) 2500 MCG TABS Take 2,500 mcg by mouth daily.    . hydrochlorothiazide (MICROZIDE) 12.5 MG capsule Take 12.5 mg by mouth daily.  6  . Magnesium 250 MG TABS Take 250 mg by mouth daily.     . metoprolol tartrate (LOPRESSOR) 25 MG tablet Take 0.5 tablets (12.5 mg total) by mouth 2 (two) times daily. 90 tablet 3  . Multiple Vitamin (MULTIVITAMIN WITH MINERALS) TABS tablet Take 1 tablet by mouth daily.    . nitroGLYCERIN (NITROSTAT) 0.4 MG SL tablet Place 1 tablet (0.4 mg total) under the tongue every 5 (five) minutes as needed for chest pain. 25 tablet 3  . Probiotic Product (PROBIOTIC FORMULA PO) Take 1 tablet by mouth every evening.     . vitamin C (ASCORBIC ACID) 500 MG tablet Take 500 mg by mouth daily.      No current facility-administered medications for this visit.    Allergies as of 06/14/2019 - Review Complete 06/14/2019  Allergen Reaction Noted  . Statins Other (See Comments) 03/02/2014  . Aspirin  02/06/2011    ROS:  General: Negative for anorexia, weight loss, fever, chills, fatigue, weakness. ENT: Negative for hoarseness, difficulty swallowing , nasal congestion. CV: Negative for chest pain, angina, palpitations, dyspnea on exertion, peripheral edema.  Respiratory: Negative for dyspnea at  rest, dyspnea on exertion, cough, sputum, wheezing.  GI: See history of present illness. GU:  Negative for dysuria, hematuria, urinary incontinence, urinary frequency, nocturnal urination.  Endo: Negative for unusual weight change.    Physical Examination:   BP (!) 143/64   Pulse 80   Temp 98.2 F (36.8 C) (Oral)   Ht 5' 6"  (1.676 m)   Wt 184 lb 3.2 oz (83.6 kg)   BMI 29.73 kg/m   General: Well-nourished, well-developed in no acute distress.  Eyes: No icterus. Mouth: masked Lungs: Clear to auscultation bilaterally.  Heart:  Regular rate and rhythm, no murmurs rubs or gallops.  Abdomen: Bowel sounds are normal, nontender, nondistended, no hepatosplenomegaly or masses, no abdominal bruits or hernia , no rebound or guarding.   Extremities: No lower extremity edema. No clubbing or deformities. Neuro: Alert and oriented x 4   Skin: Warm and dry, no jaundice.   Psych: Alert and cooperative, normal mood and affect.  Labs:  Lab Results  Component Value Date   WBC 8.0 02/24/2019   HGB 10.1 (L) 02/24/2019   HCT 31.8 (L) 02/24/2019   MCV 93.8 02/24/2019   PLT 235 02/24/2019   Lab Results  Component Value Date   CREATININE 0.79 02/24/2019   BUN 13 02/24/2019   NA 137 02/24/2019   K 3.5 02/24/2019   CL 104 02/24/2019   CO2 26 02/24/2019     Imaging Studies: No results found.

## 2019-06-14 NOTE — Assessment & Plan Note (Signed)
Generally patient has done very well with very few flares.  Over the past 48 hours she has had some abdominal cramping and loose stools but overall feels better today.  May be having a mild flare versus gastroenteritis versus stress.  Patient would like to hold off on any type of steroid therapy for now.  I feel this is reasonable since she is feeling somewhat better today.  She goes for vaccination, second dose for Covid vaccine, on Wednesday.  She will call me in a couple of days and let me know how she is doing.  If possible we will hold off on steroid therapy in light of upcoming vaccination but if symptoms worsen would offer her topical steroid therapy at any time versus starting oral steroid therapy 48 hours after the vaccine.  Continue Imuran at current dose for now.  May add Imodium 2 mg twice daily for the next few days.  She will return to the office in July and at that time we will schedule her for her next surveillance colonoscopy (due August 2021).

## 2019-06-18 ENCOUNTER — Telehealth: Payer: Self-pay | Admitting: Internal Medicine

## 2019-06-18 NOTE — Telephone Encounter (Signed)
PATIENT CALLED WITH UPDATE AND SAID Monday SHE HAD DIARRHEA, Tuesday SHE DID NOT . Wednesday AND Thursday SHE STATED SHE IS BACK TO NORMAL

## 2019-06-18 NOTE — Telephone Encounter (Signed)
Noted. Pt notified of LSL recommendations and will call back if diarrhea starts back.

## 2019-06-18 NOTE — Telephone Encounter (Signed)
Spoke with pt. On 06/14/19 when pt was seen, she was asked to call back in a few days with an update of how she was doing, so LSL could decide if she needed to start the steroid. Pt had diarrhea Monday, Tuesday pt had no diarrhea and on Wednesday & Thursday, pt was back to having her normal bowel movements

## 2019-06-18 NOTE — Telephone Encounter (Signed)
I would recommend holding off on steroid since her bowel movements are improved and just received second covid vaccine.   If your diarrhea returns have her call us.

## 2019-07-13 ENCOUNTER — Other Ambulatory Visit: Payer: Self-pay

## 2019-07-13 ENCOUNTER — Telehealth: Payer: Self-pay | Admitting: Gastroenterology

## 2019-07-13 DIAGNOSIS — D649 Anemia, unspecified: Secondary | ICD-10-CM

## 2019-07-13 DIAGNOSIS — Z79899 Other long term (current) drug therapy: Secondary | ICD-10-CM

## 2019-07-13 NOTE — Telephone Encounter (Signed)
Please find out if patient has had any recent labs in the last couple of months from PCP or other provider. We need to follow up on anemia if she hasn't. Get copy of labs if available. If none available, then order CBC, iron/tibc, ferritin.   Anemia could be from knee replacements but want to make sure it normalized.

## 2019-07-13 NOTE — Telephone Encounter (Signed)
noted 

## 2019-07-13 NOTE — Telephone Encounter (Signed)
Pt hasn't had any recent labs from other providers since October 2020. Orders placed and mailed to pt per pts request.

## 2019-07-13 NOTE — Telephone Encounter (Signed)
FYI

## 2019-07-29 LAB — CBC WITH DIFFERENTIAL/PLATELET
Absolute Monocytes: 340 cells/uL (ref 200–950)
Basophils Absolute: 18 cells/uL (ref 0–200)
Basophils Relative: 0.4 %
Eosinophils Absolute: 69 cells/uL (ref 15–500)
Eosinophils Relative: 1.5 %
HCT: 36.4 % (ref 35.0–45.0)
Hemoglobin: 11.8 g/dL (ref 11.7–15.5)
Lymphs Abs: 1302 cells/uL (ref 850–3900)
MCH: 29.4 pg (ref 27.0–33.0)
MCHC: 32.4 g/dL (ref 32.0–36.0)
MCV: 90.5 fL (ref 80.0–100.0)
MPV: 10.2 fL (ref 7.5–12.5)
Monocytes Relative: 7.4 %
Neutro Abs: 2870 cells/uL (ref 1500–7800)
Neutrophils Relative %: 62.4 %
Platelets: 255 10*3/uL (ref 140–400)
RBC: 4.02 10*6/uL (ref 3.80–5.10)
RDW: 14.9 % (ref 11.0–15.0)
Total Lymphocyte: 28.3 %
WBC: 4.6 10*3/uL (ref 3.8–10.8)

## 2019-07-29 LAB — IRON,TIBC AND FERRITIN PANEL
%SAT: 16 % (calc) (ref 16–45)
Ferritin: 181 ng/mL (ref 16–288)
Iron: 43 ug/dL — ABNORMAL LOW (ref 45–160)
TIBC: 266 mcg/dL (calc) (ref 250–450)

## 2019-09-08 ENCOUNTER — Ambulatory Visit: Payer: Medicare HMO | Admitting: Orthopedic Surgery

## 2019-09-20 ENCOUNTER — Ambulatory Visit: Payer: Medicare HMO | Admitting: Orthopedic Surgery

## 2019-09-20 ENCOUNTER — Other Ambulatory Visit: Payer: Self-pay

## 2019-09-20 VITALS — Ht 66.0 in | Wt 184.0 lb

## 2019-09-20 DIAGNOSIS — Z4789 Encounter for other orthopedic aftercare: Secondary | ICD-10-CM | POA: Diagnosis not present

## 2019-09-20 DIAGNOSIS — Z96652 Presence of left artificial knee joint: Secondary | ICD-10-CM

## 2019-09-20 DIAGNOSIS — Z96653 Presence of artificial knee joint, bilateral: Secondary | ICD-10-CM

## 2019-09-20 DIAGNOSIS — Z96651 Presence of right artificial knee joint: Secondary | ICD-10-CM

## 2019-09-20 NOTE — Progress Notes (Signed)
Chief Complaint  Patient presents with  . Follow-up    Recheck on right knee, DOS 02-23-19.    7 mos. Post op right tka, 5 years past left total knee  Bonnie Powers's complaints today are:  1.  When she crosses her left leg over her right leg she gets a burning sensation on the medial side of the leg proximally  2.  She has pain on the lateral side of both patella at the upper portion of the tibia  3.  She says she hears a knocking noise when she is walking  She is doing well other than getting in and out of the car she has no severe knee pain at all.  She has excellent extension flexion and mobility does have some issues kneeling   Ht 5' 6"  (1.676 m)   Wt 184 lb (83.5 kg)   BMI 29.70 kg/m    Physical Exam Constitutional:      General: She is not in acute distress. Musculoskeletal:     Comments: The incision is clean dry and intact there is no erythema or warmth to the knee there is no joint effusion she has full extension 120 degrees of flexion she feels absolutely stable in the sagittal and coronal plane  She has tenderness over the iliotibial band laterally and pes bursa medially she has some numbness in the characteristic area lateral to the incision  Skin:    General: Skin is warm.     Capillary Refill: Capillary refill takes less than 2 seconds.  Neurological:     General: No focal deficit present.     Mental Status: She is alert and oriented to person, place, and time.    Encounter Diagnoses  Name Primary?  . S/P total knee replacement, right 02/23/2019 Yes  . S/P TKR (total knee replacement), left 09/15/18     Nothing to severe here recommend some BenGay and ice for the iliotibial band and I went over the clicking noise with the knee model showing her post hitting against the femur and without any instability most likely that is what that is  See her for her annual x-ray in November Follow-up annual visit

## 2019-09-20 NOTE — Patient Instructions (Signed)
Use ice  Sara Lee

## 2019-10-14 ENCOUNTER — Other Ambulatory Visit (HOSPITAL_COMMUNITY): Payer: Self-pay | Admitting: Nurse Practitioner

## 2019-10-14 DIAGNOSIS — Z1231 Encounter for screening mammogram for malignant neoplasm of breast: Secondary | ICD-10-CM

## 2019-10-19 ENCOUNTER — Ambulatory Visit: Payer: Medicare HMO | Admitting: Gastroenterology

## 2019-11-22 ENCOUNTER — Other Ambulatory Visit: Payer: Self-pay

## 2019-11-22 ENCOUNTER — Ambulatory Visit (HOSPITAL_COMMUNITY)
Admission: RE | Admit: 2019-11-22 | Discharge: 2019-11-22 | Disposition: A | Discharge status: Home / Self Care | Payer: Medicare HMO | Source: Ambulatory Visit | Attending: Nurse Practitioner | Admitting: Nurse Practitioner

## 2019-11-22 DIAGNOSIS — Z1231 Encounter for screening mammogram for malignant neoplasm of breast: Secondary | ICD-10-CM

## 2019-12-06 ENCOUNTER — Ambulatory Visit: Payer: Medicare HMO | Admitting: Gastroenterology

## 2020-01-13 ENCOUNTER — Ambulatory Visit (INDEPENDENT_AMBULATORY_CARE_PROVIDER_SITE_OTHER): Payer: Medicare HMO | Admitting: Cardiology

## 2020-01-13 ENCOUNTER — Other Ambulatory Visit: Payer: Self-pay

## 2020-01-13 ENCOUNTER — Encounter: Payer: Self-pay | Admitting: Cardiology

## 2020-01-13 VITALS — BP 164/88 | HR 82 | Ht 66.0 in | Wt 194.0 lb

## 2020-01-13 DIAGNOSIS — I10 Essential (primary) hypertension: Secondary | ICD-10-CM | POA: Diagnosis not present

## 2020-01-13 DIAGNOSIS — I25810 Atherosclerosis of coronary artery bypass graft(s) without angina pectoris: Secondary | ICD-10-CM | POA: Diagnosis not present

## 2020-01-13 DIAGNOSIS — E782 Mixed hyperlipidemia: Secondary | ICD-10-CM | POA: Diagnosis not present

## 2020-01-13 MED ORDER — HYDROCHLOROTHIAZIDE 25 MG PO TABS: 25.0000 mg | ORAL_TABLET | Freq: Every day | ORAL | 3 refills | Status: DC

## 2020-01-13 NOTE — Patient Instructions (Signed)
Medication Instructions:  INCREASE HCTZ to 25 mg daily    *If you need a refill on your cardiac medications before your next appointment, please call your pharmacy*   Lab Work: IN 2 WEEKS: BMET,Magnesium,Lipids  If you have labs (blood work) drawn today and your tests are completely normal, you will receive your results only by: Marland Kitchen MyChart Message (if you have MyChart) OR . A paper copy in the mail If you have any lab test that is abnormal or we need to change your treatment, we will call you to review the results.   Testing/Procedures: None today   Follow-Up: At [facility], you and your health needs are our priority.  As part of our continuing mission to provide you with exceptional heart care, we have created designated Provider Care Teams.  These Care Teams include your primary Cardiologist (physician) and Advanced Practice Providers (APPs -  Physician Assistants and Nurse Practitioners) who all work together to provide you with the care you need, when you need it.  We recommend signing up for the patient portal called "MyChart".  Sign up information is provided on this After Visit Summary.  MyChart is used to connect with patients for Virtual Visits (Telemedicine).  Patients are able to view lab/test results, encounter notes, upcoming appointments, etc.  Non-urgent messages can be sent to your provider as well.   To learn more about what you can do with MyChart, go to [email or web address].    Your next appointment:   6 month(s)  The format for your next appointment:   In Person  Provider:   Carlyle Dolly, MD   Other Instructions None     Thank you for choosing [facility] !

## 2020-01-13 NOTE — Progress Notes (Signed)
Clinical Summary Bonnie Powers is a 74 y.o.female seen today for follow up of the following medical problems.   1. CAD - history of NSTEMI in 2015, found to have multivessel disease - had 3 vessel CABG CABG with LIMA to LAD, SVG to diagonal, and SVG to RCA. - 2015 echo LVEF 55-60%, no WMAs  - right sided chest pains at times, usually with movement of right arm.  - compliant with meds   2. Hyperlipidemia - intolerant to multiple statins according to prior notes - from notes not able to afford repatha in the past - was to be referred to lipid clinic after 12/2018 visit with Dr Bronson Ing.   - labs followed by pcp    3. HTN - home 140s/70s - compliant with meds   SocHx: Married to [place] who has h/o CVA. She is originally from [place], [place]. Numerous grandchildren in [place], [place], and [place]. Has a daughter Wyvonnia Lora Past Medical History:  Diagnosis Date  . Anxiety   . Basal cell cancer    left temple  . Coronary artery disease   . Depression   . Headache   . Hepatitis 1972   ? unknown type  . HTN (hypertension)   . Hx of cataract surgery 09/11/2015  . Hypercholesteremia   . Insomnia   . OA (osteoarthritis)   . Otitis media, chronic   . Ulcerative colitis 2001   Last colonoscopy Dr [name] 12/28/09-> pan-UC, normal TI, maintained on Asacol and Imuran     Allergies  Allergen Reactions  . Statins Other (See Comments)    Muscle aches  . Aspirin     ULCERS     Current Outpatient Medications  Medication Sig Dispense Refill  . ALPRAZolam (XANAX) 0.5 MG tablet Take 0.5 mg by mouth daily as needed for anxiety.    Marland Kitchen aspirin (ECOTRIN LOW STRENGTH) 81 MG EC tablet Take 1 tablet (81 mg total) by mouth 2 (two) times daily. Swallow whole. 60 tablet 0  . azaTHIOprine (IMURAN) 50 MG tablet TAKE 3 TABLETS (150 MG) BY MOUTH DAILY AS DIRECTED 270 tablet 3  . Calcium Carb-Cholecalciferol (CALCIUM-VITAMIN D) 600-400 MG-UNIT TABS Take 1 tablet by mouth daily.    . cholecalciferol  (VITAMIN D) 1000 UNITS tablet Take 1,000 Units by mouth daily.    . Cyanocobalamin (B-12) 2500 MCG TABS Take 2,500 mcg by mouth daily.    . hydrochlorothiazide (MICROZIDE) 12.5 MG capsule Take 12.5 mg by mouth daily.  6  . Magnesium 250 MG TABS Take 250 mg by mouth daily.     . metoprolol tartrate (LOPRESSOR) 25 MG tablet Take 0.5 tablets (12.5 mg total) by mouth 2 (two) times daily. 90 tablet 3  . Multiple Vitamin (MULTIVITAMIN WITH MINERALS) TABS tablet Take 1 tablet by mouth daily.    . nitroGLYCERIN (NITROSTAT) 0.4 MG SL tablet Place 1 tablet (0.4 mg total) under the tongue every 5 (five) minutes as needed for chest pain. 25 tablet 3  . Probiotic Product (PROBIOTIC FORMULA PO) Take 1 tablet by mouth every evening.     . vitamin C (ASCORBIC ACID) 500 MG tablet Take 500 mg by mouth daily.      No current facility-administered medications for this visit.     Past Surgical History:  Procedure Laterality Date  . BIOPSY  11/26/2017   Procedure: BIOPSY;  Surgeon: Daneil Dolin, MD;  [facility];  Service: Endoscopy;;  biopsies of normal terminal, ascending, transverse, descending, sigmoid, rectal  .  CARDIAC CATHETERIZATION    . COLONOSCOPY  12/28/09   Dr. [name] colitis  . COLONOSCOPY N/A 02/01/2016   Procedure: COLONOSCOPY;  Surgeon: Daneil Dolin, MD;  [facility];  Service: Endoscopy;  Laterality: N/A;  1015  . COLONOSCOPY N/A 11/26/2017   Dr. [name]: diffuse granular/pale colonic mucosa. couple of tiny erosions in ascending and descending segment. rectum less involved but not completely normal. multiple erosions in TI. segmental bxs with mildly active inflammation in TI, sigmoid colon, inactive chronic proctitis, colitis in descending colon. . next tcs 2 years  . CORONARY ARTERY BYPASS GRAFT N/A 02/25/2014   Procedure: CORONARY ARTERY BYPASS GRAFTING (CABG);  Surgeon: Ivin Poot, MD;  Location: [facility];  Service: Open Heart Surgery;  Laterality: N/A;  .  EYE SURGERY    . INTRAOPERATIVE TRANSESOPHAGEAL ECHOCARDIOGRAM N/A 02/25/2014   Procedure: INTRAOPERATIVE TRANSESOPHAGEAL ECHOCARDIOGRAM;  Surgeon: Ivin Poot, MD;  Location: [facility];  Service: Open Heart Surgery;  Laterality: N/A;  . KNEE SURGERY Right   . LEFT HEART CATHETERIZATION WITH CORONARY ANGIOGRAM N/A 02/23/2014   Procedure: LEFT HEART CATHETERIZATION WITH CORONARY ANGIOGRAM;  Surgeon: Burnell Blanks, MD;  Location: [facility] C[facility] LAB;  Service: Cardiovascular;  Laterality: N/A;  . SALPINGOOPHORECTOMY     right ovary, both tubes  . TONSILLECTOMY    . TOTAL KNEE ARTHROPLASTY Left 09/15/2018   Procedure: TOTAL KNEE ARTHROPLASTY;  Surgeon: Carole Civil, MD;  [facility]: [facility];  Service: Orthopedics;  Laterality: Left;  . TOTAL KNEE ARTHROPLASTY Right 02/23/2019   Procedure: TOTAL KNEE ARTHROPLASTY;  Surgeon: Carole Civil, MD;  Location: [facility];  Service: Orthopedics;  Laterality: Right;     Allergies  Allergen Reactions  . Statins Other (See Comments)    Muscle aches  . Aspirin     ULCERS      Family History  Problem Relation Age of Onset  . Skin cancer Father   . Heart failure Mother   . HIV Brother   . Lung cancer Brother      Social History Ms. Lagrange reports that she has been smoking cigarettes. She has a 15.00 pack-year smoking history. She has never used smokeless tobacco. Ms. Radziewicz reports no history of alcohol use.   Review of Systems CONSTITUTIONAL: No weight loss, fever, chills, weakness or fatigue.  HEENT: Eyes: No visual loss, blurred vision, double vision or yellow sclerae.No hearing loss, sneezing, congestion, runny nose or sore throat.  SKIN: No rash or itching.  CARDIOVASCULAR: per hpi RESPIRATORY: No shortness of breath, cough or sputum.  GASTROINTESTINAL: No anorexia, nausea, vomiting or diarrhea. No abdominal pain or blood.  GENITOURINARY: No burning on urination, no polyuria NEUROLOGICAL: No headache, dizziness, syncope,  paralysis, ataxia, numbness or tingling in the extremities. No change in bowel or bladder control.  MUSCULOSKELETAL: No muscle, back pain, joint pain or stiffness.  LYMPHATICS: No enlarged nodes. No history of splenectomy.  PSYCHIATRIC: No history of depression or anxiety.  ENDOCRINOLOGIC: No reports of sweating, cold or heat intolerance. No polyuria or polydipsia.  Marland Kitchen   Physical Examination Today's Vitals   01/13/20 1410  BP: (!) 164/88  Pulse: 82  SpO2: 96%  Weight: 194 lb (88 kg)  Height: 5' 6"  (1.676 m)   Body mass index is 31.31 kg/m.  Gen: resting comfortably, no acute distress HEENT: no scleral icterus, pupils equal round and reactive, no palptable cervical adenopathy,  CV: RRR, no m/r/g, no jvd Resp: Clear to auscultation bilaterally GI: abdomen is soft, non-tender,  non-distended, normal bowel sounds, no hepatosplenomegaly MSK: extremities are warm, no edema.  Skin: warm, no rash Neuro:  no focal deficits Psych: appropriate affect   Diagnostic Studies  02/2014 echo Study Conclusions   - Left ventricle: The cavity size was normal. Wall thickness was  increased increased in a pattern of mild to moderate LVH.  Systolic function was normal. The estimated ejection fraction was  in the range of 55% to 60%. Wall motion was normal; there were no  regional wall motion abnormalities.  - Aortic valve: Trileaflet; normal thickness, mildly calcified  leaflets. There was mild to moderate regurgitation.  - Mitral valve: Mildly calcified annulus.  - Left atrium: The atrium was moderately to severely dilated.    Assessment and Plan  1. CAD - no recent symptoms, continue current meds  2. HTN - above goal increase HCTZ to 36m daily. Check BMET/Mg in 2 weeks   3. Hyperlipidemia - intolerant to statins, repatha was too expensive - request labs from pcp, may consider zetia.       JArnoldo Lenis M.D.

## 2020-01-24 ENCOUNTER — Other Ambulatory Visit: Payer: Self-pay

## 2020-01-24 ENCOUNTER — Encounter: Payer: Self-pay | Admitting: Orthopedic Surgery

## 2020-01-24 ENCOUNTER — Other Ambulatory Visit (HOSPITAL_COMMUNITY)
Admission: RE | Admit: 2020-01-24 | Discharge: 2020-01-24 | Disposition: A | Discharge status: Home / Self Care | Payer: Medicare HMO | Source: Ambulatory Visit | Attending: Cardiology | Admitting: Cardiology

## 2020-01-24 ENCOUNTER — Ambulatory Visit: Payer: Medicare HMO

## 2020-01-24 ENCOUNTER — Ambulatory Visit: Payer: Medicare HMO | Admitting: Orthopedic Surgery

## 2020-01-24 VITALS — BP 177/61 | HR 61 | Ht 66.0 in | Wt 187.0 lb

## 2020-01-24 DIAGNOSIS — M25561 Pain in right knee: Secondary | ICD-10-CM

## 2020-01-24 DIAGNOSIS — I10 Essential (primary) hypertension: Secondary | ICD-10-CM | POA: Diagnosis present

## 2020-01-24 DIAGNOSIS — Z96651 Presence of right artificial knee joint: Secondary | ICD-10-CM | POA: Diagnosis not present

## 2020-01-24 DIAGNOSIS — W19XXXA Unspecified fall, initial encounter: Secondary | ICD-10-CM | POA: Diagnosis not present

## 2020-01-24 LAB — MAGNESIUM: Magnesium: 2.2 mg/dL (ref 1.7–2.4)

## 2020-01-24 LAB — BASIC METABOLIC PANEL
Anion gap: 10 (ref 5–15)
BUN: 21 mg/dL (ref 8–23)
CO2: 29 mmol/L (ref 22–32)
Calcium: 10.4 mg/dL — ABNORMAL HIGH (ref 8.9–10.3)
Chloride: 97 mmol/L — ABNORMAL LOW (ref 98–111)
Creatinine, Ser: 0.84 mg/dL (ref 0.44–1.00)
GFR, Estimated: >60 mL/min (ref >60)
Glucose, Bld: 96 mg/dL (ref 70–99)
Potassium: 4 mmol/L (ref 3.5–5.1)
Sodium: 136 mmol/L (ref 135–145)

## 2020-01-24 LAB — LIPID PANEL
Cholesterol: 246 mg/dL — ABNORMAL HIGH (ref 0–200)
HDL: 58 mg/dL (ref >40)
LDL Cholesterol: 164 mg/dL — ABNORMAL HIGH (ref 0–99)
Total CHOL/HDL Ratio: 4.2 RATIO
Triglycerides: 120 mg/dL (ref <150)
VLDL: 24 mg/dL (ref 0–40)

## 2020-01-24 NOTE — Progress Notes (Signed)
NEW PROBLEM//OFFICE VISIT  Chief Complaint  Patient presents with  . Knee Pain    had a fall on 01/18/2020 and fell over a cord  and wat trying to stop herself and having pain,    Encounter Diagnoses  Name Primary?  . S/P total knee replacement, right Yes  . Acute pain of right knee     F/U IN A YEAR 96283   74 year old status post total knee 2020 in November fell over a cord landed on her right knee has a large bruise complains of some pain able to walk     Review of Systems  Musculoskeletal: Negative.      Past Medical History:  Diagnosis Date  . Anxiety   . Basal cell cancer    left temple  . Coronary artery disease   . Depression   . Headache   . Hepatitis 1972   ? unknown type  . HTN (hypertension)   . Hx of cataract surgery 09/11/2015  . Hypercholesteremia   . Insomnia   . OA (osteoarthritis)   . Otitis media, chronic   . Ulcerative colitis 2001   Last colonoscopy Dr [name] 12/28/09-> pan-UC, normal TI, maintained on Asacol and Imuran    Past Surgical History:  Procedure Laterality Date  . BIOPSY  11/26/2017   Procedure: BIOPSY;  Surgeon: Daneil Dolin, MD;  [facility];  Service: Endoscopy;;  biopsies of normal terminal, ascending, transverse, descending, sigmoid, rectal  . CARDIAC CATHETERIZATION    . COLONOSCOPY  12/28/09   Dr. [name] colitis  . COLONOSCOPY N/A 02/01/2016   Procedure: COLONOSCOPY;  Surgeon: Daneil Dolin, MD;  [facility];  Service: Endoscopy;  Laterality: N/A;  1015  . COLONOSCOPY N/A 11/26/2017   Dr. [name]: diffuse granular/pale colonic mucosa. couple of tiny erosions in ascending and descending segment. rectum less involved but not completely normal. multiple erosions in TI. segmental bxs with mildly active inflammation in TI, sigmoid colon, inactive chronic proctitis, colitis in descending colon. . next tcs 2 years  . CORONARY ARTERY BYPASS GRAFT N/A 02/25/2014   Procedure: CORONARY ARTERY BYPASS  GRAFTING (CABG);  Surgeon: Ivin Poot, MD;  Location: [facility];  Service: Open Heart Surgery;  Laterality: N/A;  . EYE SURGERY    . INTRAOPERATIVE TRANSESOPHAGEAL ECHOCARDIOGRAM N/A 02/25/2014   Procedure: INTRAOPERATIVE TRANSESOPHAGEAL ECHOCARDIOGRAM;  Surgeon: Ivin Poot, MD;  Location: [facility];  Service: Open Heart Surgery;  Laterality: N/A;  . KNEE SURGERY Right   . LEFT HEART CATHETERIZATION WITH CORONARY ANGIOGRAM N/A 02/23/2014   Procedure: LEFT HEART CATHETERIZATION WITH CORONARY ANGIOGRAM;  Surgeon: Burnell Blanks, MD;  Location: [facility] CATH LAB;  Service: Cardiovascular;  Laterality: N/A;  . SALPINGOOPHORECTOMY     right ovary, both tubes  . TONSILLECTOMY    . TOTAL KNEE ARTHROPLASTY Left 09/15/2018   Procedure: TOTAL KNEE ARTHROPLASTY;  Surgeon: Carole Civil, MD;  [facility];  Service: Orthopedics;  Laterality: Left;  . TOTAL KNEE ARTHROPLASTY Right 02/23/2019   Procedure: TOTAL KNEE ARTHROPLASTY;  Surgeon: Carole Civil, MD;  Location: [facility];  Service: Orthopedics;  Laterality: Right;    Family History  Problem Relation Age of Onset  . Skin cancer Father   . Heart failure Mother   . HIV Brother   . Lung cancer Brother    Social History   Tobacco Use  . Smoking status: Current Every Day Smoker    Packs/day: 0.50    Years: 30.00  Pack years: 15.00    Types: Cigarettes    Last attempt to quit: 02/22/2014    Years since quitting: 5.9  . Smokeless tobacco: Never Used  Vaping Use  . Vaping Use: Never used  Substance Use Topics  . Alcohol use: No    Alcohol/week: 0.0 standard drinks  . Drug use: No    Allergies  Allergen Reactions  . Statins Other (See Comments)    Muscle aches  . Aspirin     ULCERS    Current Meds  Medication Sig  . ALPRAZolam (XANAX) 0.5 MG tablet Take 0.5 mg by mouth daily as needed for anxiety.  Marland Kitchen aspirin (ECOTRIN LOW STRENGTH) 81 MG EC tablet Take 1 tablet (81 mg total) by mouth 2 (two) times daily. Swallow  whole. (Patient taking differently: Take 81 mg by mouth daily. Swallow whole.)  . azaTHIOprine (IMURAN) 50 MG tablet TAKE 3 TABLETS (150 MG) BY MOUTH DAILY AS DIRECTED  . Calcium Carb-Cholecalciferol (CALCIUM-VITAMIN D) 600-400 MG-UNIT TABS Take 1 tablet by mouth daily.  . cholecalciferol (VITAMIN D) 1000 UNITS tablet Take 1,000 Units by mouth daily.  . Cyanocobalamin (B-12) 2500 MCG TABS Take 2,500 mcg by mouth daily.  . hydrochlorothiazide (HYDRODIURIL) 25 MG tablet Take 1 tablet (25 mg total) by mouth daily.  . Magnesium 250 MG TABS Take 250 mg by mouth daily.   . metoprolol tartrate (LOPRESSOR) 25 MG tablet Take 0.5 tablets (12.5 mg total) by mouth 2 (two) times daily.  . Multiple Vitamin (MULTIVITAMIN WITH MINERALS) TABS tablet Take 1 tablet by mouth daily.  . nitroGLYCERIN (NITROSTAT) 0.4 MG SL tablet Place 1 tablet (0.4 mg total) under the tongue every 5 (five) minutes as needed for chest pain.  . Probiotic Product (PROBIOTIC FORMULA PO) Take 1 tablet by mouth every evening.   . vitamin C (ASCORBIC ACID) 500 MG tablet Take 500 mg by mouth daily.     BP (!) 177/61   Pulse 61   Ht 5' 6"  (1.676 m)   Wt 187 lb (84.8 kg)   BMI 30.18 kg/m   Physical Exam Constitutional:      General: She is not in acute distress.    Appearance: She is well-developed.  Cardiovascular:     Comments: No peripheral edema Musculoskeletal:     Right knee: No effusion.  Skin:    General: Skin is warm and dry.  Neurological:     Mental Status: She is alert and oriented to person, place, and time.     Sensory: No sensory deficit.     Coordination: Coordination normal.     Deep Tendon Reflexes: Reflexes are normal and symmetric.     Right Knee Exam   Muscle Strength  The patient has normal right knee strength.  Tenderness  Right knee tenderness location: Tenderness over the medial and anteromedial knee with large bruise.  Range of Motion  The patient has normal right knee ROM.  Tests   Drawer:  Anterior - negative    Posterior - negative  Other  Sensation: normal Pulse: present Swelling: none Effusion: no effusion present  Comments:  Skin bruising        MEDICAL DECISION MAKING  A.  Encounter Diagnoses  Name Primary?  . S/P total knee replacement, right Yes  . Acute pain of right knee     B. DATA ANALYSED:   IMAGING: Interpretation of images: X-rays show a normal prosthesis with no loosening normal patellar height and alignment    C. MANAGEMENT  Change appointment for November annual x-ray to November 2022  Heat as needed normal activity No orders of the defined types were placed in this encounter.     Arther Abbott, MD  01/24/2020 9:08 AM

## 2020-01-24 NOTE — Patient Instructions (Signed)
Ok to cancel appointment for Nov 10th follow up

## 2020-01-25 ENCOUNTER — Ambulatory Visit: Payer: Medicare HMO | Admitting: Gastroenterology

## 2020-01-25 ENCOUNTER — Telehealth: Payer: Self-pay | Admitting: Cardiology

## 2020-01-25 NOTE — Telephone Encounter (Signed)
Spoke with patient and informed that [facility] has recalled irbesartan and irbesartan/hctz but at this point not her specific medication, hctz.   She had heard about a recall on social media and the news but did not get an alert from her pharmacy.  Pt grateful for the information provided.

## 2020-01-25 NOTE — Telephone Encounter (Signed)
Patient wants to know if Dr. Harl Bowie is aware of the medication recall on her medicine and what should she do about it.

## 2020-02-02 ENCOUNTER — Telehealth: Payer: Self-pay | Admitting: *Deleted

## 2020-02-02 NOTE — Telephone Encounter (Signed)
-----   Message from Arnoldo Lenis, MD sent at 02/02/2020  3:35 PM EDT ----- Cholesterol too high, can we start zetia 73m daily   JZandra AbtsMD

## 2020-02-03 MED ORDER — EZETIMIBE 10 MG PO TABS: 10.0000 mg | ORAL_TABLET | Freq: Every day | ORAL | 1 refills | Status: DC

## 2020-02-03 NOTE — Telephone Encounter (Signed)
Patient returned call to [facility].

## 2020-02-03 NOTE — Telephone Encounter (Signed)
Pt voiced understanding - Medication sent to pharmacy

## 2020-02-16 ENCOUNTER — Ambulatory Visit: Payer: Medicare HMO | Admitting: Orthopedic Surgery

## 2020-02-28 ENCOUNTER — Other Ambulatory Visit: Payer: Self-pay

## 2020-02-28 ENCOUNTER — Encounter: Payer: Self-pay | Admitting: Gastroenterology

## 2020-02-28 ENCOUNTER — Ambulatory Visit: Payer: Medicare HMO | Admitting: Gastroenterology

## 2020-02-28 VITALS — BP 142/63 | HR 68 | Temp 97.1°F | Ht 66.0 in | Wt 184.2 lb

## 2020-02-28 DIAGNOSIS — K5100 Ulcerative (chronic) pancolitis without complications: Secondary | ICD-10-CM

## 2020-02-28 DIAGNOSIS — D649 Anemia, unspecified: Secondary | ICD-10-CM

## 2020-02-28 NOTE — Patient Instructions (Addendum)
1. Colonoscopy in 04/2020. Please see separate instructions.  2. We will update your labs in 04/2020.  3. Continue imuran 157m daily. 4. Please look at the list of Foods to Avoid below: eliminate any you are eating regularly to see if this helps decrease your gas. 5. Return to the office in six months.    Low-FODMAP Eating Plan  FODMAPs (fermentable oligosaccharides, disaccharides, monosaccharides, and polyols) are sugars that are hard for some people to digest. A low-FODMAP eating plan may help some people who have bowel (intestinal) diseases to manage their symptoms. This meal plan can be complicated to follow. Work with a diet and nutrition specialist (dietitian) to make a low-FODMAP eating plan that is right for you. A dietitian can make sure that you get enough nutrition from this diet. What are tips for following this plan? Reading food labels  Check labels for hidden FODMAPs such as: ? High-fructose syrup. ? Honey. ? Agave. ? Natural fruit flavors. ? Onion or garlic powder.  Choose low-FODMAP foods that contain 3-4 grams of fiber per serving.  Check food labels for serving sizes. Eat only one serving at a time to make sure FODMAP levels stay low. Meal planning  Follow a low-FODMAP eating plan for up to 6 weeks, or as told by your health care provider or dietitian.  To follow the eating plan: 1. Eliminate high-FODMAP foods from your diet completely. 2. Gradually reintroduce high-FODMAP foods into your diet one at a time. Most people should wait a few days after introducing one high-FODMAP food before they introduce the next high-FODMAP food. Your dietitian can recommend how quickly you may reintroduce foods. 3. Keep a daily record of what you eat and drink, and make note of any symptoms that you have after eating. 4. Review your daily record with a dietitian regularly. Your dietitian can help you identify which foods you can eat and which foods you should avoid. General  tips  Drink enough fluid each day to keep your urine pale yellow.  Avoid processed foods. These often have added sugar and may be high in FODMAPs.  Avoid most dairy products, whole grains, and sweeteners.  Work with a dietitian to make sure you get enough fiber in your diet. Recommended foods Grains  Gluten-free grains, such as rice, oats, buckwheat, quinoa, corn, polenta, and millet. Gluten-free pasta, bread, or cereal. Rice noodles. Corn tortillas. Vegetables  Eggplant, zucchini, cucumber, peppers, green beans, Brussels sprouts, bean sprouts, lettuce, arugula, kale, Swiss chard, spinach, collard greens, bok choy, summer squash, potato, and tomato. Limited amounts of corn, carrot, and sweet potato. Green parts of scallions. Fruits  Bananas, oranges, lemons, limes, blueberries, raspberries, strawberries, grapes, cantaloupe, honeydew melon, kiwi, papaya, passion fruit, and pineapple. Limited amounts of dried cranberries, banana chips, and shredded coconut. Dairy  Lactose-free milk, yogurt, and kefir. Lactose-free cottage cheese and ice cream. Non-dairy milks, such as almond, coconut, hemp, and rice milk. Yogurts made of non-dairy milks. Limited amounts of goat cheese, brie, mozzarella, parmesan, swiss, and other hard cheeses. Meats and other protein foods  Unseasoned beef, pork, poultry, or fish. Eggs. BBerniece Salines Tofu (firm) and tempeh. Limited amounts of nuts and seeds, such as almonds, walnuts, bBolivianuts, pecans, peanuts, pumpkin seeds, chia seeds, and sunflower seeds. Fats and oils  Butter-free spreads. Vegetable oils, such as olive, canola, and sunflower oil. Seasoning and other foods  Artificial sweeteners with names that do not end in "ol" such as aspartame, saccharine, and stevia. Maple syrup, white table sugar, raw sugar,  brown sugar, and molasses. Fresh basil, coriander, parsley, rosemary, and thyme. Beverages  Water and mineral water. Sugar-sweetened soft drinks. Small amounts  of orange juice or cranberry juice. Black and green tea. Most dry wines. Coffee. This may not be a complete list of low-FODMAP foods. Talk with your dietitian for more information. Foods to avoid Grains  Wheat, including kamut, durum, and semolina. Barley and bulgur. Couscous. Wheat-based cereals. Wheat noodles, bread, crackers, and pastries. Vegetables  Chicory root, artichoke, asparagus, cabbage, snow peas, sugar snap peas, mushrooms, and cauliflower. Onions, garlic, leeks, and the white part of scallions. Fruits  Fresh, dried, and juiced forms of apple, pear, watermelon, peach, plum, cherries, apricots, blackberries, boysenberries, figs, nectarines, and mango. Avocado. Dairy  Milk, yogurt, ice cream, and soft cheese. Cream and sour cream. Milk-based sauces. Custard. Meats and other protein foods  Fried or fatty meat. Sausage. Cashews and pistachios. Soybeans, baked beans, black beans, chickpeas, kidney beans, fava beans, navy beans, lentils, and split peas. Seasoning and other foods  Any sugar-free gum or candy. Foods that contain artificial sweeteners such as sorbitol, mannitol, isomalt, or xylitol. Foods that contain honey, high-fructose corn syrup, or agave. Bouillon, vegetable stock, beef stock, and chicken stock. Garlic and onion powder. Condiments made with onion, such as hummus, chutney, pickles, relish, salad dressing, and salsa. Tomato paste. Beverages  Chicory-based drinks. Coffee substitutes. Chamomile tea. Fennel tea. Sweet or fortified wines such as port or sherry. Diet soft drinks made with isomalt, mannitol, maltitol, sorbitol, or xylitol. Apple, pear, and mango juice. Juices with high-fructose corn syrup. This may not be a complete list of high-FODMAP foods. Talk with your dietitian to discuss what dietary choices are best for you.  Summary  A low-FODMAP eating plan is a short-term diet that eliminates FODMAPs from your diet to help ease symptoms of certain bowel  diseases.  The eating plan usually lasts up to 6 weeks. After that, high-FODMAP foods are restarted gradually, one at a time, so you can find out which may be causing symptoms.  A low-FODMAP eating plan can be complicated. It is best to work with a dietitian who has experience with this type of plan. This information is not intended to replace advice given to you by your health care provider. Make sure you discuss any questions you have with your health care provider. Document Revised: 03/07/2017 Document Reviewed: 11/19/2016 Elsevier Patient Education  [place].

## 2020-02-28 NOTE — Patient Instructions (Signed)
PA for TCS submitted via [employer] website. Case approved. PA# [id number], valid 04/26/20-05/26/20.

## 2020-02-28 NOTE — Progress Notes (Signed)
Primary Care Physician: Renee Rival, NP  Primary Gastroenterologist:  Garfield Cornea, MD   Chief Complaint  Patient presents with  . Diarrhea    yesterday had 4 times in 3 hours  . Gas    a lot all the time    HPI: Bonnie Powers is a 74 y.o. female here for follow up UC. Last seen in 06/2019.  On Imuran 150 mg daily.  Last colonoscopy August 2019,diffuse granular/pale colonic mucosa, couple tiny erosions in the ascending and descending segments, rectum less involved but not completely normal.  Multiple erosions in the TI.  Segmental biopsies with mildly active inflammation of the TI, sigmoid colon, active chronic proctitis, colitis of the descending colon.  Next colonoscopy planned for August 2021.  Overall doing okay. Yesterday had four loose stools, no blood or mucous, in three hours and then nothing after that. Feels like related to stress or maybe something she ate.  Most days has 1 or 2 stools.  About twice per week may have increased number of stools.  Denies any significant abdominal pain or cramping.  No upper GI symptoms.  Passes a lot of gas.  Kidney embarrassing for her.  10/2019 CBC normal, LFTs normal, creatinine normal.  Results scanned into epic.   Current Outpatient Medications  Medication Sig Dispense Refill  . ALPRAZolam (XANAX) 0.5 MG tablet Take 0.5 mg by mouth daily as needed for anxiety.    Marland Kitchen aspirin (ECOTRIN LOW STRENGTH) 81 MG EC tablet Take 1 tablet (81 mg total) by mouth 2 (two) times daily. Swallow whole. (Patient taking differently: Take 81 mg by mouth daily. Swallow whole.) 60 tablet 0  . azaTHIOprine (IMURAN) 50 MG tablet TAKE 3 TABLETS (150 MG) BY MOUTH DAILY AS DIRECTED 270 tablet 3  . Calcium Carb-Cholecalciferol (CALCIUM-VITAMIN D) 600-400 MG-UNIT TABS Take 1 tablet by mouth daily.    . cholecalciferol (VITAMIN D) 1000 UNITS tablet Take 1,000 Units by mouth daily.    . Cyanocobalamin (B-12) 2500 MCG TABS Take 2,500 mcg by mouth daily.      Marland Kitchen ezetimibe (ZETIA) 10 MG tablet Take 1 tablet (10 mg total) by mouth daily. 90 tablet 1  . hydrochlorothiazide (HYDRODIURIL) 25 MG tablet Take 1 tablet (25 mg total) by mouth daily. 90 tablet 3  . Magnesium 250 MG TABS Take 250 mg by mouth daily.     . metoprolol tartrate (LOPRESSOR) 25 MG tablet Take 0.5 tablets (12.5 mg total) by mouth 2 (two) times daily. 90 tablet 3  . Multiple Vitamin (MULTIVITAMIN WITH MINERALS) TABS tablet Take 1 tablet by mouth daily.    . nitroGLYCERIN (NITROSTAT) 0.4 MG SL tablet Place 1 tablet (0.4 mg total) under the tongue every 5 (five) minutes as needed for chest pain. 25 tablet 3  . Probiotic Product (PROBIOTIC FORMULA PO) Take 1 tablet by mouth every evening.     . vitamin C (ASCORBIC ACID) 500 MG tablet Take 500 mg by mouth daily.      No current facility-administered medications for this visit.    Allergies as of 02/28/2020 - Review Complete 02/28/2020  Allergen Reaction Noted  . Statins Other (See Comments) 03/02/2014  . Aspirin  02/06/2011   Past Medical History:  Diagnosis Date  . Anxiety   . Basal cell cancer    left temple  . Coronary artery disease   . Depression   . Headache   . Hepatitis 1972   ? unknown type  . HTN (hypertension)   .  Hx of cataract surgery 09/11/2015  . Hypercholesteremia   . Insomnia   . OA (osteoarthritis)   . Otitis media, chronic   . Ulcerative colitis 2001   Last colonoscopy Dr [name] 12/28/09-> pan-UC, normal TI, maintained on Asacol and Imuran   Past Surgical History:  Procedure Laterality Date  . BIOPSY  11/26/2017   Procedure: BIOPSY;  Surgeon: Daneil Dolin, MD;  [facility]: [facility];  Service: Endoscopy;;  biopsies of normal terminal, ascending, transverse, descending, sigmoid, rectal  . CARDIAC CATHETERIZATION    . COLONOSCOPY  12/28/09   Dr. [name] colitis  . COLONOSCOPY N/A 02/01/2016   Procedure: COLONOSCOPY;  Surgeon: Daneil Dolin, MD;  Location: [facility];  Service:  Endoscopy;  Laterality: N/A;  1015  . COLONOSCOPY N/A 11/26/2017   Dr. [name]: diffuse granular/pale colonic mucosa. couple of tiny erosions in ascending and descending segment. rectum less involved but not completely normal. multiple erosions in TI. segmental bxs with mildly active inflammation in TI, sigmoid colon, inactive chronic proctitis, colitis in descending colon. . next tcs 2 years  . CORONARY ARTERY BYPASS GRAFT N/A 02/25/2014   Procedure: CORONARY ARTERY BYPASS GRAFTING (CABG);  Surgeon: Ivin Poot, MD;  Location: [place];  Service: Open Heart Surgery;  Laterality: N/A;  . EYE SURGERY    . INTRAOPERATIVE TRANSESOPHAGEAL ECHOCARDIOGRAM N/A 02/25/2014   Procedure: INTRAOPERATIVE TRANSESOPHAGEAL ECHOCARDIOGRAM;  Surgeon: Ivin Poot, MD;  Location: [place];  Service: Open Heart Surgery;  Laterality: N/A;  . KNEE SURGERY Right   . LEFT HEART CATHETERIZATION WITH CORONARY ANGIOGRAM N/A 02/23/2014   Procedure: LEFT HEART CATHETERIZATION WITH CORONARY ANGIOGRAM;  Surgeon: Burnell Blanks, MD;  Location: [facility] CATH LAB;  Service: Cardiovascular;  Laterality: N/A;  . SALPINGOOPHORECTOMY     right ovary, both tubes  . TONSILLECTOMY    . TOTAL KNEE ARTHROPLASTY Left 09/15/2018   Procedure: TOTAL KNEE ARTHROPLASTY;  Surgeon: Carole Civil, MD;  Location: [facility];  Service: Orthopedics;  Laterality: Left;  . TOTAL KNEE ARTHROPLASTY Right 02/23/2019   Procedure: TOTAL KNEE ARTHROPLASTY;  Surgeon: Carole Civil, MD;  Location: [facility];  Service: Orthopedics;  Laterality: Right;   Family History  Problem Relation Age of Onset  . Skin cancer Father   . Heart failure Mother   . HIV Brother   . Lung cancer Brother    Social History   Tobacco Use  . Smoking status: Current Every Day Smoker    Packs/day: 0.50    Years: 30.00    Pack years: 15.00    Types: Cigarettes    Last attempt to quit: 02/22/2014    Years since quitting: 6.0  . Smokeless tobacco: Never Used  Vaping Use   . Vaping Use: Never used  Substance Use Topics  . Alcohol use: No    Alcohol/week: 0.0 standard drinks  . Drug use: No     ROS:  General: Negative for anorexia, unintentional weight loss, fever, chills, fatigue, weakness. ENT: Negative for hoarseness, difficulty swallowing , nasal congestion. CV: Negative for chest pain, angina, palpitations, dyspnea on exertion, peripheral edema.  Respiratory: Negative for dyspnea at rest, dyspnea on exertion, cough, sputum, wheezing.  GI: See history of present illness. GU:  Negative for dysuria, hematuria, urinary incontinence, urinary frequency, nocturnal urination.  Endo: Negative for unusual weight change.    Physical Examination:   BP (!) 142/63   Pulse 68   Temp (!) 97.1 F (36.2 C)   Ht 5' 6"  (1.676  m)   Wt 184 lb 3.2 oz (83.6 kg)   BMI 29.73 kg/m   General: Well-nourished, well-developed in no acute distress.  Eyes: No icterus. Mouth: masked. Lungs: Clear to auscultation bilaterally.  Heart: Regular rate and rhythm, no murmurs rubs or gallops.  Abdomen: Bowel sounds are normal, nontender, nondistended, no hepatosplenomegaly or masses, no abdominal bruits or hernia , no rebound or guarding.   Extremities: No lower extremity edema. No clubbing or deformities. Neuro: Alert and oriented x 4   Skin: Warm and dry, no jaundice.   Psych: Alert and cooperative, normal mood and affect.  Labs:  Lab Results  Component Value Date   CREATININE 0.84 01/24/2020   BUN 21 01/24/2020   NA 136 01/24/2020   K 4.0 01/24/2020   CL 97 (L) 01/24/2020   CO2 29 01/24/2020   Lab Results  Component Value Date   WBC 4.6 07/28/2019   HGB 11.8 07/28/2019   HCT 36.4 07/28/2019   MCV 90.5 07/28/2019   PLT 255 07/28/2019      Imaging Studies: No results found.   Impression/Plan: 74 y/o female with history UC, well managed on Imuran 150 mg daily.  Sometimes some self-limited diarrhea which she relates to stress or something she eats.  She is  due for surveillance colonoscopy at this time.  She would like to wait till after the holidays.  We will go ahead and get her on the January schedule.  Plan for colonoscopy with Dr. [name] with conscious sedation. ASA II.  I have discussed the risks, alternatives, benefits with regards to but not limited to the risk of reaction to medication, bleeding, infection, perforation and the patient is agreeable to proceed. Written consent to be obtained.  Complains of excessive flatulence.  Encouraged her to switch probiotics, picking different bacterial strain.  Eliminate excessive use of any foods from the high FODMAP group. Handout provided.   We will update CBC in 04/2020. Return to the office in six months.

## 2020-03-20 ENCOUNTER — Other Ambulatory Visit: Payer: Self-pay

## 2020-03-20 DIAGNOSIS — D649 Anemia, unspecified: Secondary | ICD-10-CM

## 2020-03-23 ENCOUNTER — Telehealth: Payer: Self-pay | Admitting: Internal Medicine

## 2020-03-23 NOTE — Telephone Encounter (Signed)
Called pt, TCS rescheduled to 05/24/20 at 9:00am. COVID test 05/22/20 at 2:00pm. Appt letter mailed with new procedure instructions. LMOVM for endo scheduler.

## 2020-03-23 NOTE — Telephone Encounter (Signed)
Patient needs to reschedule procedure until mid February

## 2020-04-18 LAB — CBC WITH DIFFERENTIAL/PLATELET
Basophils Absolute: 0 10*3/uL (ref 0.0–0.2)
Basos: 1 %
EOS (ABSOLUTE): 0.1 10*3/uL (ref 0.0–0.4)
Eos: 2 %
Hematocrit: 36.6 % (ref 34.0–46.6)
Hemoglobin: 12.2 g/dL (ref 11.1–15.9)
Immature Grans (Abs): 0 10*3/uL (ref 0.0–0.1)
Immature Granulocytes: 0 %
Lymphocytes Absolute: 1.5 10*3/uL (ref 0.7–3.1)
Lymphs: 28 %
MCH: 29.7 pg (ref 26.6–33.0)
MCHC: 33.3 g/dL (ref 31.5–35.7)
MCV: 89 fL (ref 79–97)
Monocytes Absolute: 0.4 10*3/uL (ref 0.1–0.9)
Monocytes: 7 %
Neutrophils Absolute: 3.4 10*3/uL (ref 1.4–7.0)
Neutrophils: 62 %
Platelets: 273 10*3/uL (ref 150–450)
RBC: 4.11 x10E6/uL (ref 3.77–5.28)
RDW: 13.6 % (ref 11.7–15.4)
WBC: 5.4 10*3/uL (ref 3.4–10.8)

## 2020-04-24 ENCOUNTER — Other Ambulatory Visit (HOSPITAL_COMMUNITY): Payer: Medicare HMO

## 2020-04-27 ENCOUNTER — Ambulatory Visit: Payer: Medicare HMO | Admitting: Orthopedic Surgery

## 2020-04-27 ENCOUNTER — Telehealth: Payer: Self-pay | Admitting: Internal Medicine

## 2020-04-27 NOTE — Telephone Encounter (Signed)
Patient called and said that she still can not find someone to take her to the procedure in February

## 2020-04-27 NOTE — Telephone Encounter (Signed)
Called pt, she wants to cancel TCS d/t she is unable to find someone to take her. Advised her to let us know when she's able to proceed with TCS. Endo scheduler informed.  FYI to Neil Crouch PA.

## 2020-04-27 NOTE — Telephone Encounter (Signed)
Noted  

## 2020-05-11 ENCOUNTER — Other Ambulatory Visit: Payer: Self-pay

## 2020-05-11 MED ORDER — METOPROLOL TARTRATE 25 MG PO TABS
12.5000 mg | ORAL_TABLET | Freq: Two times a day (BID) | ORAL | 3 refills | Status: DC
Start: 2020-05-11 — End: 2021-03-14

## 2020-05-11 NOTE — Telephone Encounter (Signed)
Medication refill request for Metoprolol Tart 25 mg tablets approved.

## 2020-05-18 ENCOUNTER — Ambulatory Visit: Payer: Medicare HMO

## 2020-05-18 ENCOUNTER — Encounter: Payer: Self-pay | Admitting: Orthopedic Surgery

## 2020-05-18 ENCOUNTER — Ambulatory Visit: Payer: Medicare HMO | Admitting: Orthopedic Surgery

## 2020-05-18 ENCOUNTER — Other Ambulatory Visit: Payer: Self-pay

## 2020-05-18 VITALS — BP 160/77 | HR 71 | Ht 66.0 in | Wt 189.0 lb

## 2020-05-18 DIAGNOSIS — G8929 Other chronic pain: Secondary | ICD-10-CM

## 2020-05-18 DIAGNOSIS — Z96652 Presence of left artificial knee joint: Secondary | ICD-10-CM | POA: Diagnosis not present

## 2020-05-18 DIAGNOSIS — M25562 Pain in left knee: Secondary | ICD-10-CM | POA: Diagnosis not present

## 2020-05-18 DIAGNOSIS — M5410 Radiculopathy, site unspecified: Secondary | ICD-10-CM | POA: Diagnosis not present

## 2020-05-18 MED ORDER — GABAPENTIN 300 MG PO CAPS: 300.0000 mg | ORAL_CAPSULE | Freq: Every day | ORAL | 5 refills | Status: AC

## 2020-05-18 NOTE — Progress Notes (Addendum)
Chief Complaint  Patient presents with  . Knee Pain    Left, starting hurting a month ago rolled over in bed and heard a pop   75 YO BILATERAL TKA S   LEFT KNEE POPPED   FEELS FINE EXCEPT WHILE IN BED BENDING IT  NO TROUBLE WALKING   ROS: SOME KNEE PAIN ON THE RIGHT   THE PAIN IN THE LEFT LEG IS LATERAL AND FROM KNEE DOWN   Review of systems only reports hearing loss no chest pain or shortness of breath  Past Surgical History:  Procedure Laterality Date  . BIOPSY  11/26/2017   Procedure: BIOPSY;  Surgeon: Daneil Dolin, MD;  [facility];  Service: Endoscopy;;  biopsies of normal terminal, ascending, transverse, descending, sigmoid, rectal  . CARDIAC CATHETERIZATION    . COLONOSCOPY  12/28/09   Dr. [name] colitis  . COLONOSCOPY N/A 02/01/2016   Procedure: COLONOSCOPY;  Surgeon: Daneil Dolin, MD;  [facility];  Service: Endoscopy;  Laterality: N/A;  1015  . COLONOSCOPY N/A 11/26/2017   Dr. [name]: diffuse granular/pale colonic mucosa. couple of tiny erosions in ascending and descending segment. rectum less involved but not completely normal. multiple erosions in TI. segmental bxs with mildly active inflammation in TI, sigmoid colon, inactive chronic proctitis, colitis in descending colon. . next tcs 2 years  . CORONARY ARTERY BYPASS GRAFT N/A 02/25/2014   Procedure: CORONARY ARTERY BYPASS GRAFTING (CABG);  Surgeon: Ivin Poot, MD;  Location: [place];  Service: Open Heart Surgery;  Laterality: N/A;  . EYE SURGERY    . INTRAOPERATIVE TRANSESOPHAGEAL ECHOCARDIOGRAM N/A 02/25/2014   Procedure: INTRAOPERATIVE TRANSESOPHAGEAL ECHOCARDIOGRAM;  Surgeon: Ivin Poot, MD;  Location: [place];  Service: Open Heart Surgery;  Laterality: N/A;  . KNEE SURGERY Right   . LEFT HEART CATHETERIZATION WITH CORONARY ANGIOGRAM N/A 02/23/2014   Procedure: LEFT HEART CATHETERIZATION WITH CORONARY ANGIOGRAM;  Surgeon: Burnell Blanks, MD;  Location: [facility] CATH LAB;   Service: Cardiovascular;  Laterality: N/A;  . SALPINGOOPHORECTOMY     right ovary, both tubes  . TONSILLECTOMY    . TOTAL KNEE ARTHROPLASTY Left 09/15/2018   Procedure: TOTAL KNEE ARTHROPLASTY;  Surgeon: Carole Civil, MD;  Location: [facility];  Service: Orthopedics;  Laterality: Left;  . TOTAL KNEE ARTHROPLASTY Right 02/23/2019   Procedure: TOTAL KNEE ARTHROPLASTY;  Surgeon: Carole Civil, MD;  Location: [facility];  Service: Orthopedics;  Laterality: Right;   BP (!) 160/77   Pulse 71   Ht 5' 6"  (1.676 m)   Wt 189 lb (85.7 kg)   BMI 30.51 kg/m   She is awake alert and oriented mood and affect normal   She does have hearing loss   Right and left lower extremities   Hip flexion no weakness   Knee extension no weakness   Tenderness lateral thigh lateral knee lateral leg down to the ankle   Imaging: Left total knee implant is stable no complications seen  Diagnosis   Encounter Diagnoses  Name Primary?  . Chronic pain of left knee   . Radicular leg pain Yes    L5 RADICULAR PAIN   Meds ordered this encounter  Medications  . gabapentin (NEURONTIN) 300 MG capsule    Sig: Take 1 capsule (300 mg total) by mouth at bedtime.    Dispense:  30 capsule    Refill:  5     START GABAPENTIN

## 2020-05-18 NOTE — Patient Instructions (Addendum)
START NEURONTIN 1 HS FOR 30 DAYS   Radicular Pain Radicular pain is a type of pain that spreads from your back or neck along a spinal nerve. Spinal nerves are nerves that leave the spinal cord and go to the muscles. Radicular pain is sometimes called radiculopathy, radiculitis, or a pinched nerve. When you have this type of pain, you may also have weakness, numbness, or tingling in the area of your body that is supplied by the nerve. The pain may feel sharp and burning. Depending on which spinal nerve is affected, the pain may occur in the:  Neck area (cervical radicular pain). You may also feel pain, numbness, weakness, or tingling in the arms.  Mid-spine area (thoracic radicular pain). You would feel this pain in the back and chest. This type is rare.  Lower back area (lumbar radicular pain). You would feel this pain as low back pain. You may feel pain, numbness, weakness, or tingling in the buttocks or legs. Sciatica is a type of lumbar radicular pain that shoots down the back of the leg. Radicular pain occurs when one of the spinal nerves becomes irritated or squeezed (compressed). It is often caused by something pushing on a spinal nerve, such as one of the bones of the spine (vertebrae) or one of the round cushions between vertebrae (intervertebral disks). This can result from:  An injury.  Wear and tear or aging of a disk.  The growth of a bone spur that pushes on the nerve. Radicular pain often goes away when you follow instructions from your health care provider for relieving pain at home. Follow these instructions at home: Managing pain  If directed, put ice on the affected area: ? Put ice in a plastic bag. ? Place a towel between your skin and the bag. ? Leave the ice on for 20 minutes, 2-3 times a day.  If directed, apply heat to the affected area as often as told by your health care provider. Use the heat source that your health care provider recommends, such as a moist heat  pack or a heating pad. ? Place a towel between your skin and the heat source. ? Leave the heat on for 20-30 minutes. ? Remove the heat if your skin turns bright red. This is especially important if you are unable to feel pain, heat, or cold. You may have a greater risk of getting burned.      Activity  Do not sit or rest in bed for long periods of time.  Try to stay as active as possible. Ask your health care provider what type of exercise or activity is best for you.  Avoid activities that make your pain worse, such as bending and lifting.  Do not lift anything that is heavier than 10 lb (4.5 kg), or the limit that you are told, until your health care provider says that it is safe.  Practice using proper technique when lifting items. Proper lifting technique involves bending your knees and rising up.  Do strength and range-of-motion exercises only as told by your health care provider or physical therapist.   General instructions  Take over-the-counter and prescription medicines only as told by your health care provider.  Pay attention to any changes in your symptoms.  Keep all follow-up visits as told by your health care provider. This is important. ? Your health care provider may send you to a physical therapist to help with this pain. Contact a health care provider if:  Your pain  and other symptoms get worse.  Your pain medicine is not helping.  Your pain has not improved after a few weeks of home care.  You have a fever. Get help right away if:  You have severe pain, weakness, or numbness.  You have difficulty with bladder or bowel control. Summary  Radicular pain is a type of pain that spreads from your back or neck along a spinal nerve.  When you have radicular pain, you may also have weakness, numbness, or tingling in the area of your body that is supplied by the nerve.  The pain may feel sharp or burning.  Radicular pain may be treated with ice, heat,  medicines, or physical therapy. This information is not intended to replace advice given to you by your health care provider. Make sure you discuss any questions you have with your health care provider. Document Revised: 10/07/2017 Document Reviewed: 10/07/2017 Elsevier Patient Education  2021 [employer].

## 2020-05-22 ENCOUNTER — Other Ambulatory Visit (HOSPITAL_COMMUNITY)
Admission: RE | Admit: 2020-05-22 | Discharge: 2020-05-22 | Disposition: A | Discharge status: Home / Self Care | Payer: Medicare HMO | Source: Ambulatory Visit | Attending: Internal Medicine | Admitting: Internal Medicine

## 2020-05-22 NOTE — Progress Notes (Signed)
Patient's procedure was canceled. No need for covid test today.

## 2020-05-24 ENCOUNTER — Encounter (HOSPITAL_COMMUNITY): Payer: Self-pay

## 2020-05-24 ENCOUNTER — Ambulatory Visit (HOSPITAL_COMMUNITY): Admit: 2020-05-24 | Payer: Medicare HMO | Admitting: Internal Medicine

## 2020-05-24 SURGERY — COLONOSCOPY
Anesthesia: Moderate Sedation

## 2020-05-31 ENCOUNTER — Ambulatory Visit: Payer: Medicare HMO | Admitting: Orthopedic Surgery

## 2020-06-07 ENCOUNTER — Other Ambulatory Visit: Payer: Self-pay | Admitting: Nurse Practitioner

## 2020-06-07 DIAGNOSIS — K51011 Ulcerative (chronic) pancolitis with rectal bleeding: Secondary | ICD-10-CM

## 2020-08-10 ENCOUNTER — Encounter: Payer: Self-pay | Admitting: Internal Medicine

## 2020-08-11 ENCOUNTER — Ambulatory Visit: Payer: Medicare HMO | Admitting: Cardiology

## 2020-08-15 ENCOUNTER — Other Ambulatory Visit: Payer: Self-pay | Admitting: Cardiology

## 2020-08-16 ENCOUNTER — Telehealth: Payer: Self-pay | Admitting: Cardiology

## 2020-08-16 MED ORDER — EZETIMIBE 10 MG PO TABS
10.0000 mg | ORAL_TABLET | Freq: Every day | ORAL | 0 refills | Status: DC
Start: 2020-08-16 — End: 2020-09-11

## 2020-08-16 NOTE — Telephone Encounter (Signed)
Medication refill request approved and sent to pharmacy.  

## 2020-08-16 NOTE — Telephone Encounter (Signed)
Needs 60 days called in since her appt was cancelled for last Friday due to Provider not being in office.    *STAT* If patient is at the pharmacy, call can be transferred to refill team.   1. Which medications need to be refilled? (please list name of each medication and dose if known) ezetimibe (ZETIA) 10 MG tablet  2. Which pharmacy/location (including street and city if local pharmacy) is medication to be sent to? [facility] [place] [address]   3. Do they need a 30 day or 90 day supply? 60 day

## 2020-08-30 ENCOUNTER — Ambulatory Visit: Payer: Medicare HMO | Admitting: Gastroenterology

## 2020-09-11 ENCOUNTER — Other Ambulatory Visit: Payer: Self-pay | Admitting: Cardiology

## 2020-10-16 ENCOUNTER — Other Ambulatory Visit (HOSPITAL_COMMUNITY): Payer: Self-pay | Admitting: Nurse Practitioner

## 2020-10-16 DIAGNOSIS — Z1231 Encounter for screening mammogram for malignant neoplasm of breast: Secondary | ICD-10-CM

## 2020-10-24 ENCOUNTER — Encounter: Payer: Self-pay | Admitting: *Deleted

## 2020-10-24 ENCOUNTER — Other Ambulatory Visit: Payer: Self-pay

## 2020-10-24 ENCOUNTER — Ambulatory Visit: Payer: Medicare HMO | Admitting: Cardiology

## 2020-10-24 ENCOUNTER — Encounter: Payer: Self-pay | Admitting: Cardiology

## 2020-10-24 VITALS — BP 164/78 | HR 76 | Ht 66.0 in | Wt 194.2 lb

## 2020-10-24 DIAGNOSIS — I25708 Atherosclerosis of coronary artery bypass graft(s), unspecified, with other forms of angina pectoris: Secondary | ICD-10-CM | POA: Diagnosis not present

## 2020-10-24 DIAGNOSIS — I10 Essential (primary) hypertension: Secondary | ICD-10-CM

## 2020-10-24 DIAGNOSIS — Z951 Presence of aortocoronary bypass graft: Secondary | ICD-10-CM

## 2020-10-24 DIAGNOSIS — E782 Mixed hyperlipidemia: Secondary | ICD-10-CM

## 2020-10-24 MED ORDER — NITROGLYCERIN 0.4 MG SL SUBL: 0.4000 mg | SUBLINGUAL_TABLET | SUBLINGUAL | 1 refills | Status: AC | PRN

## 2020-10-24 MED ORDER — HYDROCHLOROTHIAZIDE 25 MG PO TABS
25.0000 mg | ORAL_TABLET | Freq: Every day | ORAL | 2 refills | Status: DC
Start: 2020-10-24 — End: 2021-04-10

## 2020-10-24 MED ORDER — EZETIMIBE 10 MG PO TABS
ORAL_TABLET | ORAL | 2 refills | Status: DC
Start: 2020-10-24 — End: 2021-06-11

## 2020-10-24 NOTE — Patient Instructions (Addendum)
Medication Instructions:  Continue all current medications.  Labwork: none  Testing/Procedures: none  Follow-Up: 6 months   Any Other Special Instructions Will Be Listed Below (If Applicable). Call Friday with update on blood pressure readings.    If you need a refill on your cardiac medications before your next appointment, please call your pharmacy.

## 2020-10-24 NOTE — Progress Notes (Signed)
Clinical Summary Ms. Camposano is a 75 y.o.female seen today for follow up of the following medical problems.    1. CAD - history of NSTEMI in 2015, found to have multivessel disease - had 3 vessel CABG CABG with LIMA to LAD, SVG to diagonal, and SVG to RCA. - 2015 echo LVEF 55-60%, no WMAs   -no cardiac chest pains, chronic right sided pain with movement at times - compliant with meds     2. Hyperlipidemia - intolerant to multiple statins according to prior notes - from notes not able to afford repatha in the past - was to be referred to lipid clinic after 12/2018 visit with Dr Bronson Ing.  - repatha was too expensive     - we recently started zetia. Tolerating well, upcoming labs with pcp   3. HTN    - home bp's 120s-130s/70s   Soc Hx: Married to [employer] who has h/o CVA. She is originally from [place], [place]. Numerous grandchildren in [place], [place], and [place]. Has a daughter in [place], [place]   Past Medical History:  Diagnosis Date   Anxiety    Basal cell cancer    left temple   Coronary artery disease    Depression    Headache    Hepatitis 1972   ? unknown type   HTN (hypertension)    Hx of cataract surgery 09/11/2015   Hypercholesteremia    Insomnia    OA (osteoarthritis)    Otitis media, chronic    Ulcerative colitis 2001   Last colonoscopy Dr [name] 12/28/09-> pan-UC, normal TI, maintained on Asacol and Imuran     Allergies  Allergen Reactions   Statins Other (See Comments)    Muscle aches   Aspirin     ULCERS     Current Outpatient Medications  Medication Sig Dispense Refill   ALPRAZolam (XANAX) 0.5 MG tablet Take 0.5 mg by mouth daily as needed for anxiety.     aspirin (ECOTRIN LOW STRENGTH) 81 MG EC tablet Take 1 tablet (81 mg total) by mouth 2 (two) times daily. Swallow whole. (Patient taking differently: Take 81 mg by mouth daily. Swallow whole.) 60 tablet 0   azaTHIOprine (IMURAN) 50 MG tablet TAKE 3 TABLETS (150 MG) BY MOUTH DAILY AS DIRECTED 270  tablet 3   Calcium Carb-Cholecalciferol (CALCIUM-VITAMIN D) 600-400 MG-UNIT TABS Take 1 tablet by mouth daily.     cholecalciferol (VITAMIN D) 1000 UNITS tablet Take 1,000 Units by mouth daily.     Cyanocobalamin (B-12) 2500 MCG TABS Take 2,500 mcg by mouth daily.     ezetimibe (ZETIA) 10 MG tablet TAKE 1 TABLET BY MOUTH ONCE DAILY, PLEASE SCHEDULE APPOINTMENT FOR FUTURE REFILLS 30 tablet 6   gabapentin (NEURONTIN) 300 MG capsule Take 1 capsule (300 mg total) by mouth at bedtime. 30 capsule 5   hydrochlorothiazide (HYDRODIURIL) 25 MG tablet Take 1 tablet (25 mg total) by mouth daily. 90 tablet 3   Magnesium 250 MG TABS Take 250 mg by mouth daily.      metoprolol tartrate (LOPRESSOR) 25 MG tablet Take 0.5 tablets (12.5 mg total) by mouth 2 (two) times daily. 90 tablet 3   Multiple Vitamin (MULTIVITAMIN WITH MINERALS) TABS tablet Take 1 tablet by mouth daily.     nitroGLYCERIN (NITROSTAT) 0.4 MG SL tablet Place 1 tablet (0.4 mg total) under the tongue every 5 (five) minutes as needed for chest pain. 25 tablet 3   Probiotic Product (PROBIOTIC FORMULA PO) Take 1 tablet  by mouth every evening.      vitamin C (ASCORBIC ACID) 500 MG tablet Take 500 mg by mouth daily.      No current facility-administered medications for this visit.     Past Surgical History:  Procedure Laterality Date   BIOPSY  11/26/2017   Procedure: BIOPSY;  Surgeon: Daneil Dolin, MD;  [facility];  Service: Endoscopy;;  biopsies of normal terminal, ascending, transverse, descending, sigmoid, rectal   CARDIAC CATHETERIZATION     COLONOSCOPY  12/28/09   Dr. [name] colitis   COLONOSCOPY N/A 02/01/2016   Procedure: COLONOSCOPY;  Surgeon: Daneil Dolin, MD;  [facility];  Service: Endoscopy;  Laterality: N/A;  1015   COLONOSCOPY N/A 11/26/2017   Dr. [name]: diffuse granular/pale colonic mucosa. couple of tiny erosions in ascending and descending segment. rectum less involved but not completely  normal. multiple erosions in TI. segmental bxs with mildly active inflammation in TI, sigmoid colon, inactive chronic proctitis, colitis in descending colon. . next tcs 2 years   CORONARY ARTERY BYPASS GRAFT N/A 02/25/2014   Procedure: CORONARY ARTERY BYPASS GRAFTING (CABG);  Surgeon: Ivin Poot, MD;  Location: [place];  Service: Open Heart Surgery;  Laterality: N/A;   EYE SURGERY     INTRAOPERATIVE TRANSESOPHAGEAL ECHOCARDIOGRAM N/A 02/25/2014   Procedure: INTRAOPERATIVE TRANSESOPHAGEAL ECHOCARDIOGRAM;  Surgeon: Ivin Poot, MD;  Location: [place];  Service: Open Heart Surgery;  Laterality: N/A;   KNEE SURGERY Right    LEFT HEART CATHETERIZATION WITH CORONARY ANGIOGRAM N/A 02/23/2014   Procedure: LEFT HEART CATHETERIZATION WITH CORONARY ANGIOGRAM;  Surgeon: Burnell Blanks, MD;  Location[facility] C[facility] LAB;  Service: Cardiovascular;  Laterality: N/A;   SALPINGOOPHORECTOMY     right ovary, both tubes   TONSILLECTOMY     TOTAL KNEE ARTHROPLASTY Left 09/15/2018   Procedure: TOTAL KNEE ARTHROPLASTY;  Surgeon: Carole Civil, MD;  Location: [facility];  Service: Orthopedics;  Laterality: Left;   TOTAL KNEE ARTHROPLASTY Right 02/23/2019   Procedure: TOTAL KNEE ARTHROPLASTY;  Surgeon: Carole Civil, MD;  Location: [facility];  Service: Orthopedics;  Laterality: Right;     Allergies  Allergen Reactions   Statins Other (See Comments)    Muscle aches   Aspirin     ULCERS      Family History  Problem Relation Age of Onset   Skin cancer Father    Heart failure Mother    HIV Brother    Lung cancer Brother      Social History Ms. Tabor reports that she has been smoking cigarettes. She has a 15.00 pack-year smoking history. She has never used smokeless tobacco. Ms. Rice reports no history of alcohol use.   Review of Systems CONSTITUTIONAL: No weight loss, fever, chills, weakness or fatigue.  HEENT: Eyes: No visual loss, blurred vision, double vision or yellow sclerae.No hearing  loss, sneezing, congestion, runny nose or sore throat.  SKIN: No rash or itching.  CARDIOVASCULAR: per hpi RESPIRATORY: No shortness of breath, cough or sputum.  GASTROINTESTINAL: No anorexia, nausea, vomiting or diarrhea. No abdominal pain or blood.  GENITOURINARY: No burning on urination, no polyuria NEUROLOGICAL: No headache, dizziness, syncope, paralysis, ataxia, numbness or tingling in the extremities. No change in bowel or bladder control.  MUSCULOSKELETAL: No muscle, back pain, joint pain or stiffness.  LYMPHATICS: No enlarged nodes. No history of splenectomy.  PSYCHIATRIC: No history of depression or anxiety.  ENDOCRINOLOGIC: No reports of sweating, cold or heat intolerance. No polyuria or  polydipsia.  Marland Kitchen   Physical Examination Today's Vitals   10/24/20 1505  BP: (!) 164/78  Pulse: 76  SpO2: 97%  Weight: 194 lb 3.2 oz (88.1 kg)  Height: 5' 6"  (1.676 m)   Body mass index is 31.34 kg/m.  Gen: resting comfortably, no acute distress HEENT: no scleral icterus, pupils equal round and reactive, no palptable cervical adenopathy,  CV: RRR, 2/6 systolic murmur rusb, no jvd Resp: Clear to auscultation bilaterally GI: abdomen is soft, non-tender, non-distended, normal bowel sounds, no hepatosplenomegaly MSK: extremities are warm, no edema.  Skin: warm, no rash Neuro:  no focal deficits Psych: appropriate affect   Diagnostic Studies 02/2014 echo Study Conclusions   - Left ventricle: The cavity size was normal. Wall thickness was    increased increased in a pattern of mild to moderate LVH.    Systolic function was normal. The estimated ejection fraction was    in the range of 55% to 60%. Wall motion was normal; there were no    regional wall motion abnormalities.  - Aortic valve: Trileaflet; normal thickness, mildly calcified    leaflets. There was mild to moderate regurgitation.  - Mitral valve: Mildly calcified annulus.  - Left atrium: The atrium was moderately to severely  dilated.    Assessment and Plan   1. CAD - no symptoms, continue current meds   2. HTN - elevated in clinic, her reported home numebrs are goal - monitor bp's at home and update [place] on Friday    3. Hyperlipidemia - intolerant to statins, repatha was too expensive - started on zetia, upciming labs with pcp   F/u 6 months    Arnoldo Lenis, M.D.

## 2020-11-23 ENCOUNTER — Other Ambulatory Visit: Payer: Self-pay

## 2020-11-23 ENCOUNTER — Ambulatory Visit (HOSPITAL_COMMUNITY)
Admission: RE | Admit: 2020-11-23 | Discharge: 2020-11-23 | Disposition: A | Discharge status: Home / Self Care | Payer: Medicare HMO | Source: Ambulatory Visit | Attending: Nurse Practitioner | Admitting: Nurse Practitioner

## 2020-11-23 DIAGNOSIS — Z1231 Encounter for screening mammogram for malignant neoplasm of breast: Secondary | ICD-10-CM | POA: Diagnosis not present

## 2020-11-28 ENCOUNTER — Ambulatory Visit: Payer: Medicare HMO | Admitting: Gastroenterology

## 2021-02-19 ENCOUNTER — Ambulatory Visit: Payer: Medicare HMO

## 2021-02-19 ENCOUNTER — Ambulatory Visit: Payer: Medicare HMO | Admitting: Orthopedic Surgery

## 2021-02-19 ENCOUNTER — Other Ambulatory Visit: Payer: Self-pay

## 2021-02-19 DIAGNOSIS — Z96651 Presence of right artificial knee joint: Secondary | ICD-10-CM

## 2021-02-19 DIAGNOSIS — Z96652 Presence of left artificial knee joint: Secondary | ICD-10-CM | POA: Diagnosis not present

## 2021-02-19 DIAGNOSIS — F419 Anxiety disorder, unspecified: Secondary | ICD-10-CM | POA: Insufficient documentation

## 2021-02-19 DIAGNOSIS — Z96653 Presence of artificial knee joint, bilateral: Secondary | ICD-10-CM

## 2021-02-19 DIAGNOSIS — G8929 Other chronic pain: Secondary | ICD-10-CM | POA: Insufficient documentation

## 2021-02-19 DIAGNOSIS — M170 Bilateral primary osteoarthritis of knee: Secondary | ICD-10-CM

## 2021-02-19 DIAGNOSIS — D649 Anemia, unspecified: Secondary | ICD-10-CM | POA: Insufficient documentation

## 2021-02-19 DIAGNOSIS — R739 Hyperglycemia, unspecified: Secondary | ICD-10-CM | POA: Insufficient documentation

## 2021-02-19 DIAGNOSIS — K1120 Sialoadenitis, unspecified: Secondary | ICD-10-CM | POA: Insufficient documentation

## 2021-02-19 DIAGNOSIS — K929 Disease of digestive system, unspecified: Secondary | ICD-10-CM | POA: Insufficient documentation

## 2021-02-19 DIAGNOSIS — K5100 Ulcerative (chronic) pancolitis without complications: Secondary | ICD-10-CM | POA: Insufficient documentation

## 2021-02-19 DIAGNOSIS — R059 Cough, unspecified: Secondary | ICD-10-CM | POA: Insufficient documentation

## 2021-02-19 DIAGNOSIS — F4320 Adjustment disorder, unspecified: Secondary | ICD-10-CM | POA: Insufficient documentation

## 2021-02-19 DIAGNOSIS — E559 Vitamin D deficiency, unspecified: Secondary | ICD-10-CM | POA: Insufficient documentation

## 2021-02-19 NOTE — Progress Notes (Signed)
Chief Complaint  Patient presents with   s/p bilat knee replacement    1 year follow up Pt states she has had several falls since she was here last    Encounter Diagnoses  Name Primary?   S/P TKR (total knee replacement), left 09/15/18 Yes   S/P total knee replacement, right 02/13/19    Bilateral primary osteoarthritis of knee    2 years postop bilateral total knee Sigma fixed-bearing PS knees  No complaints  Patient does have weakness getting up has frequent falls and some balance issues  Right knee flexion 120 degrees left knee 118 degrees  Full extension both knees are stable  X-ray of the right knee and AP of both knees show stable total knees  X-ray in 2 years

## 2021-03-14 ENCOUNTER — Other Ambulatory Visit: Payer: Self-pay | Admitting: Cardiology

## 2021-04-07 ENCOUNTER — Encounter (HOSPITAL_COMMUNITY): Payer: Self-pay | Admitting: *Deleted

## 2021-04-07 ENCOUNTER — Other Ambulatory Visit: Payer: Self-pay | Admitting: Cardiology

## 2021-04-07 ENCOUNTER — Other Ambulatory Visit: Payer: Self-pay

## 2021-04-07 ENCOUNTER — Emergency Department (HOSPITAL_COMMUNITY)
Admission: EM | Admit: 2021-04-07 | Discharge: 2021-04-07 | Disposition: A | Discharge status: Home / Self Care | Payer: Medicare HMO | Attending: Emergency Medicine | Admitting: Emergency Medicine

## 2021-04-07 DIAGNOSIS — Z955 Presence of coronary angioplasty implant and graft: Secondary | ICD-10-CM | POA: Insufficient documentation

## 2021-04-07 DIAGNOSIS — Z85828 Personal history of other malignant neoplasm of skin: Secondary | ICD-10-CM | POA: Diagnosis not present

## 2021-04-07 DIAGNOSIS — Z96651 Presence of right artificial knee joint: Secondary | ICD-10-CM | POA: Diagnosis not present

## 2021-04-07 DIAGNOSIS — U071 COVID-19: Secondary | ICD-10-CM | POA: Diagnosis not present

## 2021-04-07 DIAGNOSIS — I10 Essential (primary) hypertension: Secondary | ICD-10-CM | POA: Insufficient documentation

## 2021-04-07 DIAGNOSIS — F17210 Nicotine dependence, cigarettes, uncomplicated: Secondary | ICD-10-CM | POA: Insufficient documentation

## 2021-04-07 DIAGNOSIS — Z7982 Long term (current) use of aspirin: Secondary | ICD-10-CM | POA: Diagnosis not present

## 2021-04-07 DIAGNOSIS — R509 Fever, unspecified: Secondary | ICD-10-CM | POA: Diagnosis present

## 2021-04-07 DIAGNOSIS — I2510 Atherosclerotic heart disease of native coronary artery without angina pectoris: Secondary | ICD-10-CM | POA: Diagnosis not present

## 2021-04-07 DIAGNOSIS — Z79899 Other long term (current) drug therapy: Secondary | ICD-10-CM | POA: Diagnosis not present

## 2021-04-07 MED ORDER — MOLNUPIRAVIR EUA 200MG CAPSULE: 4.0000 | ORAL_CAPSULE | Freq: Two times a day (BID) | ORAL | 0 refills | Status: AC

## 2021-04-07 MED ORDER — MOLNUPIRAVIR EUA 200MG CAPSULE
4.0000 | ORAL_CAPSULE | Freq: Two times a day (BID) | ORAL | Status: DC
  Administered 2021-04-07: 800 mg via ORAL
  Filled 2021-04-07: qty 4

## 2021-04-07 NOTE — ED Provider Notes (Signed)
[facility] EMERGENCY DEPARTMENT Provider Note   CSN: [id number] Arrival date & time: 04/07/21  1558     History Chief Complaint  Patient presents with   Fever    Bonnie Powers is a 75 y.o. female.   Fever  This patient is a 75 year old female, she has a history of hypertension as well as ulcerative colitis, she presents to the hospital after having 4 days of symptoms including a fever of 100.1 and a runny stuffy nose.  She is not coughing or having shortness of breath and has no nausea vomiting or diarrhea.  She took to test for COVID today and both were positive at home, her husband is high risk that she wants to know if she qualifies for treatment.  She does not want to be admitted to the hospital.  Symptoms are persistent, they do not seem to be getting any worse, she denies any rashes swelling or severe headache  Past Medical History:  Diagnosis Date   Anxiety    Basal cell cancer    left temple   Coronary artery disease    Depression    Headache    Hepatitis 1972   ? 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   Cough 02/19/2021   Digestive system disease 02/19/2021   Hypercalcemia 02/19/2021   Hyperglycemia 02/19/2021   Sialoadenitis 02/19/2021   Vitamin D deficiency 02/19/2021   S/P total knee replacement, right 02/23/2019 03/09/2019   History of total right knee replacement 02/23/2019   Primary osteoarthritis of right knee    S/P TKR (total knee replacement), left 09/15/18 09/28/2018   Primary osteoarthritis of left knee     Flatulence 03/04/2018   Acute blood loss anemia 04/07/2014   CAD (coronary artery disease) 02/25/2014   NSTEMI (non-ST elevated myocardial infarction) ([place]) 02/23/2014   Essential hypertension 02/23/2014   Hyperlipidemia 02/23/2014   Tobacco abuse 02/23/2014   Sinusitis, acute 12/14/2012   UC (ulcerative colitis) ([place]) 08/17/2010   ULCERATIVE COLITIS--UNIVERSAL ULCERATIVE COLITIS 11/07/2009   RECTAL BLEEDING 11/07/2009   Diarrhea 11/07/2009    Past Surgical History:  Procedure Laterality Date   BIOPSY  11/26/2017   Procedure: BIOPSY;  Surgeon: Daneil Dolin, MD;  [facility];  Service: Endoscopy;;  biopsies of normal terminal, ascending, transverse, descending, sigmoid, rectal   CARDIAC CATHETERIZATION     COLONOSCOPY  12/28/09   Dr. [name] colitis   COLONOSCOPY N/A 02/01/2016   Procedure: COLONOSCOPY;  Surgeon: Daneil Dolin, MD;  [facility];  Service: Endoscopy;  Laterality: N/A;  1015   COLONOSCOPY N/A 11/26/2017   Dr. [name]: diffuse granular/pale colonic mucosa. couple of tiny erosions in ascending and descending segment. rectum less involved but not completely normal. multiple erosions in TI. segmental bxs with mildly active inflammation in TI, sigmoid colon, inactive chronic proctitis, colitis in descending colon. . next tcs 2 years   CORONARY ARTERY BYPASS GRAFT N/A 02/25/2014   Procedure: CORONARY ARTERY BYPASS GRAFTING (CABG);  Surgeon: Ivin Poot, MD;  [facility];  Service: Open Heart Surgery;  Laterality: N/A;   EYE SURGERY     INTRAOPERATIVE TRANSESOPHAGEAL ECHOCARDIOGRAM N/A 02/25/2014   Procedure: INTRAOPERATIVE TRANSESOPHAGEAL ECHOCARDIOGRAM;  Surgeon: Ivin Poot, MD;  Location: [place];  Service: Open Heart Surgery;  Laterality: N/A;   KNEE SURGERY Right    LEFT HEART CATHETERIZATION WITH CORONARY ANGIOGRAM N/A 02/23/2014   Procedure: LEFT HEART CATHETERIZATION WITH CORONARY ANGIOGRAM;  Surgeon: Burnell Blanks,  MD;  Location: [facility] CATH LAB;  Service: Cardiovascular;  Laterality: N/A;   SALPINGOOPHORECTOMY     right ovary, both tubes   TONSILLECTOMY     TOTAL KNEE ARTHROPLASTY Left 09/15/2018   Procedure: TOTAL KNEE ARTHROPLASTY;  Surgeon: Carole Civil, MD;  [facility];  Service: Orthopedics;  Laterality: Left;   TOTAL KNEE ARTHROPLASTY Right 02/23/2019   Procedure: TOTAL KNEE ARTHROPLASTY;  Surgeon: Carole Civil, MD;  Location[facility];  Service: Orthopedics;  Laterality: Right;     OB History   No obstetric history on file.     Family History  Problem Relation Age of Onset   Skin cancer Father    Heart failure Mother    HIV Brother    Lung cancer Brother     Social History   Tobacco Use   Smoking status: Every Day    Packs/day: 0.50    Years: 30.00    Pack years: 15.00    Types: Cigarettes    Last attempt to quit: 02/22/2014    Years since quitting: 7.1   Smokeless tobacco: Never  Vaping Use   Vaping Use: Never used  Substance Use Topics   Alcohol use: No    Alcohol/week: 0.0 standard drinks   Drug use: No    Home Medications Prior to Admission medications   Medication Sig Start Date End Date Taking? Authorizing Provider  ALPRAZolam Duanne Moron) 0.5 MG tablet Take 0.5 mg by mouth daily as needed for anxiety.    [provider]  aspirin (ECOTRIN LOW STRENGTH) 81 MG EC tablet Take 1 tablet (81 mg total) by mouth 2 (two) times daily. Swallow whole. Patient taking differently: Take 81 mg by mouth daily. Swallow whole. 02/24/19   Carole Civil, MD  azaTHIOprine (IMURAN) 50 MG tablet TAKE 3 TABLETS (150 MG) BY MOUTH DAILY AS DIRECTED 06/07/20   Erenest Rasher, PA-C  Calcium Carb-Cholecalciferol (CALCIUM-VITAMIN D) 600-400 MG-UNIT TABS Take 1 tablet by mouth daily.    [provider]  cholecalciferol (VITAMIN D) 1000 UNITS tablet Take 1,000 Units by mouth daily.    [provider]  Cyanocobalamin (B-12) 2500 MCG TABS Take 2,500 mcg by  mouth daily.    [provider]  ezetimibe (ZETIA) 10 MG tablet TAKE 1 TABLET BY MOUTH ONCE DAILY 10/24/20   Arnoldo Lenis, MD  gabapentin (NEURONTIN) 300 MG capsule Take 1 capsule (300 mg total) by mouth at bedtime. 05/18/20   Carole Civil, MD  hydrochlorothiazide (HYDRODIURIL) 25 MG tablet Take 1 tablet (25 mg total) by mouth daily. 10/24/20   Arnoldo Lenis, MD  Magnesium 250 MG TABS Take 250 mg by mouth daily.     [provider]  metoprolol tartrate (LOPRESSOR) 25 MG tablet TAKE 1/2 TABLET TWICE DAILY 03/14/21   Arnoldo Lenis, MD  Multiple Vitamin (MULTIVITAMIN WITH MINERALS) TABS tablet Take 1 tablet by mouth daily.    [provider]  nitroGLYCERIN (NITROSTAT) 0.4 MG SL tablet Place 1 tablet (0.4 mg total) under the  tongue every 5 (five) minutes x 3 doses as needed for chest pain (if no relief after 2nd dose, proceed to the ED for an evaluation or call 911). 10/24/20   Arnoldo Lenis, MD  Probiotic Product (PROBIOTIC FORMULA PO) Take 1 tablet by mouth every evening.     [provider]  vitamin C (ASCORBIC ACID) 500 MG tablet Take 500 mg by mouth daily.     [provider]    Allergies    Statins and Aspirin  Review of Systems   Review of Systems  Constitutional:  Positive for fever.  All other systems reviewed and are negative.  Physical Exam Updated Vital Signs BP (!) 200/69 (BP Location: Right Arm)    Pulse 81    Temp 98.5 F (36.9 C) (Oral)    Resp 20    Ht 1.676 m (5' 6" )    Wt 83.9 kg    SpO2 98%    BMI 29.86 kg/m   Physical Exam Vitals and nursing note reviewed.  Constitutional:      General: She is not in acute distress.    Appearance: She is well-developed. She is not ill-appearing.  HENT:     Head: Normocephalic and atraumatic.     Mouth/Throat:     Pharynx: No oropharyngeal exudate.  Eyes:     General: No scleral icterus.       Right eye: No discharge.        Left eye: No discharge.      Conjunctiva/sclera: Conjunctivae normal.     Pupils: Pupils are equal, round, and reactive to light.  Neck:     Thyroid: No thyromegaly.     Vascular: No JVD.  Cardiovascular:     Rate and Rhythm: Normal rate and regular rhythm.     Heart sounds: Normal heart sounds. No murmur heard.   No friction rub. No gallop.  Pulmonary:     Effort: Pulmonary effort is normal. No respiratory distress.     Breath sounds: Normal breath sounds. No wheezing or rales.  Abdominal:     General: Bowel sounds are normal. There is no distension.     Palpations: Abdomen is soft. There is no mass.     Tenderness: There is no abdominal tenderness.  Musculoskeletal:        General: No tenderness. Normal range of motion.     Cervical back: Normal range of motion and neck supple.  Lymphadenopathy:     Cervical: No cervical adenopathy.  Skin:    General: Skin is warm and dry.     Findings: No erythema or rash.  Neurological:     General: No focal deficit present.     Mental Status: She is alert.     Coordination: Coordination normal.  Psychiatric:        Behavior: Behavior normal.    ED Results / Procedures / Treatments   Labs (all labs ordered are listed, but only abnormal results are displayed) Labs Reviewed - No data to display  EKG None  Radiology No results found.  Procedures Procedures   Medications Ordered in ED Medications  molnupiravir EUA (LAGEVRIO) capsule 800 mg (has no administration in time range)    ED Course  I have reviewed the triage vital signs and the nursing notes.  Pertinent labs & imaging results that were available during my care of the patient were reviewed by me and considered in my medical decision making (see chart for details).    MDM Rules/Calculators/A&P  This patient appears well, she has had 2 home positive test, I do not feel the need to test her again formally here.  We will start treatment with antiviral.  She is hypertensive  but states that she does have some whitecoat syndrome, she will take her blood pressure medications again tonight, she states she is compliant with this it is usually 346 systolic.  She has no chest pain, no shortness of breath, she has not febrile here and is well-appearing without nausea vomiting or diarrhea.  Quarantine instructions given, she expressed her understanding.  Stable for discharge     Final Clinical Impression(s) / ED Diagnoses Final diagnoses:  [id number]    Rx / DC Orders ED Discharge Orders     None        Noemi Chapel, MD 04/07/21 1705

## 2021-04-07 NOTE — Discharge Instructions (Signed)
Take the antiviral twice a day for the next 5 days, we have given you the first dose here.  You may use DayQuil or NyQuil to treat your other symptoms, keep your fever down by taking Tylenol or ibuprofen.  Thank you for letting us take care of you today!  Please obtain all of your results from medical records or have your doctors office obtain the results - share them with your doctor - you should be seen at your doctors office in the next 2 days. Call today to arrange your follow up. Take the medications as prescribed. Please review all of the medicines and only take them if you do not have an allergy to them. Please be aware that if you are taking birth control pills, taking other prescriptions, ESPECIALLY ANTIBIOTICS may make the birth control ineffective - if this is the case, either do not engage in sexual activity or use alternative methods of birth control such as condoms until you have finished the medicine and your family doctor says it is OK to restart them. If you are on a blood thinner such as COUMADIN, be aware that any other medicine that you take may cause the coumadin to either work too much, or not enough - you should have your coumadin level rechecked in next 7 days if this is the case.  ?  It is also a possibility that you have an allergic reaction to any of the medicines that you have been prescribed - Everybody reacts differently to medications and while MOST people have no trouble with most medicines, you may have a reaction such as nausea, vomiting, rash, swelling, shortness of breath. If this is the case, please stop taking the medicine immediately and contact your physician.   If you were given a medication in the ED such as percocet, vicodin, or morphine, be aware that these medicines are sedating and may change your ability to take care of yourself adequately for several hours after being given this medicines - you should not drive or take care of small children if you were given  this medicine in the Emergency Department or if you have been prescribed these types of medicines. ?   You should return to the ER IMMEDIATELY if you develop severe or worsening symptoms.

## 2021-04-07 NOTE — ED Triage Notes (Signed)
Pt with fever since Wednesday. At home Covid test was negative yesterday, today tested x 2 and both were positive.  + sneezing +cough +runny nose Denies N/V/D

## 2021-04-11 ENCOUNTER — Ambulatory Visit: Payer: Medicare HMO | Admitting: Gastroenterology

## 2021-04-19 ENCOUNTER — Encounter: Payer: Self-pay | Admitting: Cardiology

## 2021-04-30 ENCOUNTER — Ambulatory Visit: Payer: Medicare HMO | Admitting: Cardiology

## 2021-04-30 ENCOUNTER — Other Ambulatory Visit: Payer: Self-pay

## 2021-04-30 ENCOUNTER — Encounter: Payer: Self-pay | Admitting: Cardiology

## 2021-04-30 ENCOUNTER — Other Ambulatory Visit (HOSPITAL_COMMUNITY): Payer: Self-pay | Admitting: Nurse Practitioner

## 2021-04-30 VITALS — BP 144/60 | HR 76 | Ht 66.0 in | Wt 183.2 lb

## 2021-04-30 DIAGNOSIS — E782 Mixed hyperlipidemia: Secondary | ICD-10-CM | POA: Diagnosis not present

## 2021-04-30 DIAGNOSIS — I25708 Atherosclerosis of coronary artery bypass graft(s), unspecified, with other forms of angina pectoris: Secondary | ICD-10-CM

## 2021-04-30 DIAGNOSIS — M8589 Other specified disorders of bone density and structure, multiple sites: Secondary | ICD-10-CM

## 2021-04-30 DIAGNOSIS — I10 Essential (primary) hypertension: Secondary | ICD-10-CM | POA: Diagnosis not present

## 2021-04-30 MED ORDER — AMLODIPINE BESYLATE 5 MG PO TABS: 5.0000 mg | ORAL_TABLET | Freq: Every day | ORAL | 3 refills | Status: AC

## 2021-04-30 NOTE — Progress Notes (Addendum)
Clinical Summary Ms. Scroggs is a 76 y.o.female seen today for follow up of the following medical problems.    1. CAD - history of NSTEMI in 2015, found to have multivessel disease - had 3 vessel CABG CABG with LIMA to LAD, SVG to diagonal, and SVG to RCA. - 2015 echo LVEF 55-60%, no WMAs   - no recent chest pains. Has had chronic MSK right sided chest pains at times.  - compliant with meds. ASA listed as allergy but tolerates 91m daily.      2. Hyperlipidemia - intolerant to multiple statins according to prior notes - from notes not able to afford repatha in the past - was to be referred to lipid clinic after 12/2018 visit with Dr KBronson Ing  - repatha was too expensive      - 11/2020 TC 181 TG 94 HDL 53 LDL 109   3. HTN - she is compliant with meds - home bp's 130s-140s/60s-65   4. Recent COVID + 04/07/21   Soc Hx: Married to DMolson Coors Brewingwho has h/o CVA. [place] a daughter in [place]  Past Medical History:  Diagnosis Date   Anxiety    Basal cell cancer    left temple   Coronary artery disease    Depression    Headache    Hepatitis 1972   ? unknown type   HTN (hypertension)    Hx of cataract surgery 09/11/2015   Hypercholesteremia    Insomnia    OA (osteoarthritis)    Otitis media, chronic    Ulcerative colitis 2001   Last colonoscopy Dr RGala Romney9/22/11-> pan-UC, normal TI, maintained on Asacol and Imuran     Allergies  Allergen Reactions   Statins Other (See Comments)    Muscle aches   Aspirin     ULCERS     Current Outpatient Medications  Medication Sig Dispense Refill   ALPRAZolam (XANAX) 0.5 MG tablet Take 0.5 mg by mouth daily as needed for anxiety.     aspirin (ECOTRIN LOW STRENGTH) 81 MG EC tablet Take 1 tablet (81 mg total) by mouth 2 (two) times daily. Swallow whole. (Patient taking differently: Take 81 mg by mouth daily. Swallow whole.) 60 tablet 0    azaTHIOprine (IMURAN) 50 MG tablet TAKE 3 TABLETS (150 MG) BY MOUTH DAILY AS DIRECTED 270 tablet 3   Calcium Carb-Cholecalciferol (CALCIUM-VITAMIN D) 600-400 MG-UNIT TABS Take 1 tablet by mouth daily.     cholecalciferol (VITAMIN D) 1000 UNITS tablet Take 1,000 Units by mouth daily.     Cyanocobalamin (B-12) 2500 MCG TABS Take 2,500 mcg by mouth daily.     ezetimibe (ZETIA) 10 MG tablet TAKE 1 TABLET BY MOUTH ONCE DAILY 90 tablet 2   gabapentin (NEURONTIN) 300 MG capsule Take 1 capsule (300 mg total) by mouth at bedtime. 30 capsule 5   hydrochlorothiazide (HYDRODIURIL) 25 MG tablet Take 1 tablet by mouth once daily 90 tablet 0   Magnesium 250 MG TABS Take 250 mg by mouth daily.      metoprolol tartrate (LOPRESSOR) 25 MG tablet TAKE 1/2 TABLET TWICE DAILY 90 tablet 3   Multiple Vitamin (MULTIVITAMIN WITH MINERALS) TABS tablet Take 1 tablet by mouth daily.     nitroGLYCERIN (NITROSTAT) 0.4 MG SL tablet Place 1 tablet (0.4 mg total) under the tongue every 5 (five) minutes x 3 doses as needed for chest pain (if no  relief after 2nd dose, proceed to the ED for an evaluation or call 911). 75 tablet 1   Probiotic Product (PROBIOTIC FORMULA PO) Take 1 tablet by mouth every evening.      vitamin C (ASCORBIC ACID) 500 MG tablet Take 500 mg by mouth daily.      No current facility-administered medications for this visit.     Past Surgical History:  Procedure Laterality Date   BIOPSY  11/26/2017   Procedure: BIOPSY;  Surgeon: Daneil Dolin, MD;  Location: [facility];  Service: Endoscopy;;  biopsies of normal terminal, ascending, transverse, descending, sigmoid, rectal   CARDIAC CATHETERIZATION     COLONOSCOPY  12/28/09   Dr. [name] colitis   COLONOSCOPY N/A 02/01/2016   Procedure: COLONOSCOPY;  Surgeon: Daneil Dolin, MD;  Location: [facility];  Service: Endoscopy;  Laterality: N/A;  1015   COLONOSCOPY N/A 11/26/2017   Dr. [name]: diffuse granular/pale colonic mucosa. couple of tiny  erosions in ascending and descending segment. rectum less involved but not completely normal. multiple erosions in TI. segmental bxs with mildly active inflammation in TI, sigmoid colon, inactive chronic proctitis, colitis in descending colon. . next tcs 2 years   CORONARY ARTERY BYPASS GRAFT N/A 02/25/2014   Procedure: CORONARY ARTERY BYPASS GRAFTING (CABG);  Surgeon: Ivin Poot, MD;  Location: [place];  Service: Open Heart Surgery;  Laterality: N/A;   EYE SURGERY     INTRAOPERATIVE TRANSESOPHAGEAL ECHOCARDIOGRAM N/A 02/25/2014   Procedure: INTRAOPERATIVE TRANSESOPHAGEAL ECHOCARDIOGRAM;  Surgeon: Ivin Poot, MD;  Location: [place];  Service: Open Heart Surgery;  Laterality: N/A;   KNEE SURGERY Right    LEFT HEART CATHETERIZATION WITH CORONARY ANGIOGRAM N/A 02/23/2014   Procedure: LEFT HEART CATHETERIZATION WITH CORONARY ANGIOGRAM;  Surgeon: Burnell Blanks, MD;  Location: [facility];  Service: Cardiovascular;  Laterality: N/A;   SALPINGOOPHORECTOMY     right ovary, both tubes   TONSILLECTOMY     TOTAL KNEE ARTHROPLASTY Left 09/15/2018   Procedure: TOTAL KNEE ARTHROPLASTY;  Surgeon: Carole Civil, MD;  Location: [facility];  Service: Orthopedics;  Laterality: Left;   TOTAL KNEE ARTHROPLASTY Right 02/23/2019   Procedure: TOTAL KNEE ARTHROPLASTY;  Surgeon: Carole Civil, MD;  Location: [facility];  Service: Orthopedics;  Laterality: Right;     Allergies  Allergen Reactions   Statins Other (See Comments)    Muscle aches   Aspirin     ULCERS      Family History  Problem Relation Age of Onset   Skin cancer Father    Heart failure Mother    HIV Brother    Lung cancer Brother      Social History Ms. Hulce reports that she has been smoking cigarettes. She has a 15.00 pack-year smoking history. She has never used smokeless tobacco. Ms. Shor reports no history of alcohol use.   Review of Systems CONSTITUTIONAL: No weight loss, fever, chills, weakness or fatigue.   HEENT: Eyes: No visual loss, blurred vision, double vision or yellow sclerae.No hearing loss, sneezing, congestion, runny nose or sore throat.  SKIN: No rash or itching.  CARDIOVASCULAR: per hpi RESPIRATORY: No shortness of breath, cough or sputum.  GASTROINTESTINAL: No anorexia, nausea, vomiting or diarrhea. No abdominal pain or blood.  GENITOURINARY: No burning on urination, no polyuria NEUROLOGICAL: No headache, dizziness, syncope, paralysis, ataxia, numbness or tingling in the extremities. No change in bowel or bladder control.  MUSCULOSKELETAL: No muscle, back pain, joint pain or stiffness.  LYMPHATICS: No  enlarged nodes. No history of splenectomy.  PSYCHIATRIC: No history of depression or anxiety.  ENDOCRINOLOGIC: No reports of sweating, cold or heat intolerance. No polyuria or polydipsia.  Marland Kitchen   Physical Examination Today's Vitals   04/30/21 1009  BP: (!) 144/60  Pulse: 76  SpO2: 95%  Weight: 183 lb 3.2 oz (83.1 kg)  Height: 5' 6"  (1.676 m)   Body mass index is 29.57 kg/m.  Gen: resting comfortably, no acute distress HEENT: no scleral icterus, pupils equal round and reactive, no palptable cervical adenopathy,  CV: RRR, 2/6 systolic murmur rusb, no jvd Resp: Clear to auscultation bilaterally GI: abdomen is soft, non-tender, non-distended, normal bowel sounds, no hepatosplenomegaly MSK: extremities are warm, no edema.  Skin: warm, no rash Neuro:  no focal deficits Psych: appropriate affect   Diagnostic Studies  02/2014 echo Study Conclusions   - Left ventricle: The cavity size was normal. Wall thickness was    increased increased in a pattern of mild to moderate LVH.    Systolic function was normal. The estimated ejection fraction was    in the range of 55% to 60%. Wall motion was normal; there were no    regional wall motion abnormalities.  - Aortic valve: Trileaflet; normal thickness, mildly calcified    leaflets. There was mild to moderate regurgitation.  -  Mitral valve: Mildly calcified annulus.  - Left atrium: The atrium was moderately to severely dilated.     Assessment and Plan   1. CAD - no symptoms, continue current meds - EKG shows SR, no ischemic changes   2. HTN - above goal, start norvasc 27m daily.   3. Hyperlipidemia - intolerant to statins, repatha was too expensive -continue zetia. May revisit costs of repatha in the near future to see if anything has changed  4. Heart murmur Mild systolic murmur RUSB, prior echo did show some AV thickening but normal function. Likely repeat echo after next visit. She is very busy with health issues with her husband right now and his many medical appointments.      JArnoldo Lenis M.D.

## 2021-04-30 NOTE — Patient Instructions (Signed)
Medication Instructions:  START Norvasc 5 mg tablets daily  Follow-Up: Follow up with Dr. Harl Bowie in 6 months.   If you need a refill on your cardiac medications before your next appointment, please call your pharmacy.

## 2021-05-09 ENCOUNTER — Other Ambulatory Visit: Payer: Self-pay

## 2021-05-09 ENCOUNTER — Ambulatory Visit (HOSPITAL_COMMUNITY)
Admission: RE | Admit: 2021-05-09 | Discharge: 2021-05-09 | Disposition: A | Discharge status: Home / Self Care | Payer: Medicare HMO | Source: Ambulatory Visit | Attending: Nurse Practitioner | Admitting: Nurse Practitioner

## 2021-05-09 DIAGNOSIS — M8589 Other specified disorders of bone density and structure, multiple sites: Secondary | ICD-10-CM | POA: Diagnosis present

## 2021-06-11 ENCOUNTER — Other Ambulatory Visit: Payer: Self-pay | Admitting: Cardiology

## 2021-06-21 ENCOUNTER — Other Ambulatory Visit (HOSPITAL_COMMUNITY): Payer: Self-pay | Admitting: Nurse Practitioner

## 2021-06-26 ENCOUNTER — Other Ambulatory Visit (HOSPITAL_COMMUNITY): Payer: Self-pay | Admitting: Nurse Practitioner

## 2021-06-26 DIAGNOSIS — R222 Localized swelling, mass and lump, trunk: Secondary | ICD-10-CM

## 2021-06-28 ENCOUNTER — Ambulatory Visit (HOSPITAL_COMMUNITY)
Admission: RE | Admit: 2021-06-28 | Discharge: 2021-06-28 | Disposition: A | Discharge status: Home / Self Care | Payer: Medicare HMO | Source: Ambulatory Visit | Attending: Nurse Practitioner | Admitting: Nurse Practitioner

## 2021-06-28 ENCOUNTER — Encounter (HOSPITAL_COMMUNITY): Payer: Medicare HMO

## 2021-06-28 ENCOUNTER — Other Ambulatory Visit: Payer: Self-pay

## 2021-06-28 ENCOUNTER — Encounter (HOSPITAL_COMMUNITY): Payer: Self-pay

## 2021-06-28 DIAGNOSIS — N644 Mastodynia: Secondary | ICD-10-CM | POA: Diagnosis present

## 2021-07-23 ENCOUNTER — Ambulatory Visit (HOSPITAL_COMMUNITY)
Admission: RE | Admit: 2021-07-23 | Discharge: 2021-07-23 | Disposition: A | Discharge status: Home / Self Care | Payer: Medicare HMO | Source: Ambulatory Visit | Attending: Nurse Practitioner | Admitting: Nurse Practitioner

## 2021-07-23 DIAGNOSIS — R222 Localized swelling, mass and lump, trunk: Secondary | ICD-10-CM | POA: Diagnosis present

## 2021-07-23 MED ORDER — IOHEXOL 300 MG/ML  SOLN
100.0000 mL | Freq: Once | INTRAMUSCULAR | Status: AC | PRN
  Administered 2021-07-23: 75 mL via INTRAVENOUS

## 2021-07-24 ENCOUNTER — Encounter (HOSPITAL_COMMUNITY): Payer: Medicare HMO

## 2021-07-24 ENCOUNTER — Other Ambulatory Visit (HOSPITAL_COMMUNITY): Payer: Medicare HMO

## 2021-07-26 LAB — POCT I-STAT CREATININE: Creatinine, Ser: 0.9 mg/dL (ref 0.44–1.00)

## 2021-08-01 ENCOUNTER — Other Ambulatory Visit: Payer: Self-pay | Admitting: Gastroenterology

## 2021-08-01 ENCOUNTER — Other Ambulatory Visit: Payer: Self-pay | Admitting: Cardiology

## 2021-08-01 DIAGNOSIS — K51011 Ulcerative (chronic) pancolitis with rectal bleeding: Secondary | ICD-10-CM

## 2021-08-01 NOTE — Telephone Encounter (Signed)
Last ov 02/28/2020 ?

## 2021-08-03 NOTE — Telephone Encounter (Signed)
Patient overdue for follow up. She got lost to follow up after she cancelled her colonoscopy. ?Limited refills on imuran. I reviewed her labs 04/2021, LFTs, creatinine normal but Hgb down a little bit in the 10.6 range.  ? ?Please arrange for ov with rourk, he has not seen her in a long time.  ?

## 2021-08-06 NOTE — Telephone Encounter (Signed)
Lmom for pt to return my call.  

## 2021-08-07 NOTE — Telephone Encounter (Signed)
Called and spoke with patient, she will be moving to [place] in 3 weeks and will be finding a new GI provider there.  ?

## 2021-08-14 ENCOUNTER — Other Ambulatory Visit (HOSPITAL_COMMUNITY): Payer: Self-pay | Admitting: Nurse Practitioner

## 2021-08-14 ENCOUNTER — Other Ambulatory Visit (HOSPITAL_COMMUNITY): Payer: Self-pay | Admitting: Family Medicine

## 2021-08-14 ENCOUNTER — Other Ambulatory Visit (HOSPITAL_COMMUNITY): Payer: Self-pay | Admitting: Physician Assistant

## 2021-10-06 DEATH — deceased

## 2021-10-30 ENCOUNTER — Ambulatory Visit: Payer: Self-pay | Admitting: Cardiology

## 2023-02-24 ENCOUNTER — Ambulatory Visit: Payer: Medicare HMO | Admitting: Orthopedic Surgery
